# Patient Record
Sex: Male | Born: 1990 | Race: White | Hispanic: No | Marital: Single | Smoking: Former smoker
Health system: Southern US, Community
[De-identification: ages and names within clinical notes are randomized; demographics above are authoritative.]

## PROBLEM LIST (undated history)

## (undated) DIAGNOSIS — F4310 Post-traumatic stress disorder, unspecified: Secondary | ICD-10-CM

## (undated) DIAGNOSIS — F32A Depression, unspecified: Secondary | ICD-10-CM

## (undated) DIAGNOSIS — E785 Hyperlipidemia, unspecified: Secondary | ICD-10-CM

## (undated) DIAGNOSIS — F79 Unspecified intellectual disabilities: Secondary | ICD-10-CM

## (undated) DIAGNOSIS — E662 Morbid (severe) obesity with alveolar hypoventilation: Secondary | ICD-10-CM

## (undated) DIAGNOSIS — E119 Type 2 diabetes mellitus without complications: Secondary | ICD-10-CM

## (undated) DIAGNOSIS — E669 Obesity, unspecified: Secondary | ICD-10-CM

## (undated) DIAGNOSIS — F419 Anxiety disorder, unspecified: Secondary | ICD-10-CM

## (undated) DIAGNOSIS — F319 Bipolar disorder, unspecified: Secondary | ICD-10-CM

## (undated) DIAGNOSIS — F329 Major depressive disorder, single episode, unspecified: Secondary | ICD-10-CM

## (undated) DIAGNOSIS — F909 Attention-deficit hyperactivity disorder, unspecified type: Secondary | ICD-10-CM

## (undated) DIAGNOSIS — R0683 Snoring: Secondary | ICD-10-CM

## (undated) DIAGNOSIS — K219 Gastro-esophageal reflux disease without esophagitis: Secondary | ICD-10-CM

## (undated) DIAGNOSIS — I10 Essential (primary) hypertension: Secondary | ICD-10-CM

## (undated) DIAGNOSIS — F209 Schizophrenia, unspecified: Secondary | ICD-10-CM

## (undated) HISTORY — DX: Hyperlipidemia, unspecified: E78.5

## (undated) HISTORY — DX: Obesity, unspecified: E66.9

## (undated) HISTORY — DX: Morbid (severe) obesity with alveolar hypoventilation: E66.2

## (undated) HISTORY — DX: Essential (primary) hypertension: I10

## (undated) HISTORY — DX: Snoring: R06.83

## (undated) HISTORY — PX: HAND SURGERY: SHX662

## (undated) HISTORY — DX: Gastro-esophageal reflux disease without esophagitis: K21.9

## (undated) HISTORY — DX: Attention-deficit hyperactivity disorder, unspecified type: F90.9

## (undated) HISTORY — DX: Anxiety disorder, unspecified: F41.9

---
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
## 2009-03-08 ENCOUNTER — Inpatient Hospital Stay: Payer: Self-pay | Admitting: Psychiatry

## 2009-07-16 ENCOUNTER — Inpatient Hospital Stay: Payer: Self-pay | Admitting: Psychiatry

## 2009-09-01 ENCOUNTER — Emergency Department: Payer: Self-pay | Admitting: Unknown Physician Specialty

## 2009-11-30 ENCOUNTER — Inpatient Hospital Stay: Payer: Self-pay | Admitting: Psychiatry

## 2010-06-01 ENCOUNTER — Inpatient Hospital Stay: Payer: Self-pay | Admitting: Psychiatry

## 2010-06-13 ENCOUNTER — Emergency Department (HOSPITAL_COMMUNITY)
Admission: EM | Admit: 2010-06-13 | Discharge: 2010-06-13 | Payer: Self-pay | Source: Home / Self Care | Admitting: Emergency Medicine

## 2010-08-28 LAB — DIFFERENTIAL
Eosinophils Absolute: 0.2 10*3/uL (ref 0.0–0.7)
Lymphocytes Relative: 33 % (ref 12–46)
Lymphs Abs: 3 10*3/uL (ref 0.7–4.0)
Monocytes Relative: 11 % (ref 3–12)
Neutro Abs: 4.8 10*3/uL (ref 1.7–7.7)
Neutrophils Relative %: 53 % (ref 43–77)

## 2010-08-28 LAB — CBC
MCV: 84 fL (ref 78.0–100.0)
RBC: 4.76 MIL/uL (ref 4.22–5.81)
WBC: 9.1 10*3/uL (ref 4.0–10.5)

## 2010-08-28 LAB — RAPID URINE DRUG SCREEN, HOSP PERFORMED

## 2010-08-29 LAB — URINALYSIS, ROUTINE W REFLEX MICROSCOPIC
Nitrite: NEGATIVE
Specific Gravity, Urine: 1.024 (ref 1.005–1.030)
pH: 7 (ref 5.0–8.0)

## 2010-08-29 LAB — COMPREHENSIVE METABOLIC PANEL
ALT: 25 U/L (ref 0–53)
AST: 29 U/L (ref 0–37)
Albumin: 3.4 g/dL — ABNORMAL LOW (ref 3.5–5.2)
CO2: 23 mEq/L (ref 19–32)
Calcium: 8.6 mg/dL (ref 8.4–10.5)
Chloride: 105 mEq/L (ref 96–112)
GFR calc Af Amer: >60 mL/min (ref >60)
GFR calc non Af Amer: >60 mL/min (ref >60)
Sodium: 136 mEq/L (ref 135–145)

## 2010-08-29 LAB — VALPROIC ACID LEVEL: Valproic Acid Lvl: 73.5 ug/mL (ref 50.0–100.0)

## 2010-08-29 LAB — DIFFERENTIAL
Eosinophils Absolute: 0.3 10*3/uL (ref 0.0–0.7)
Eosinophils Relative: 5 % (ref 0–5)
Lymphs Abs: 2.5 10*3/uL (ref 0.7–4.0)
Monocytes Absolute: 0.7 10*3/uL (ref 0.1–1.0)

## 2010-08-29 LAB — CBC
MCHC: 34.5 g/dL (ref 30.0–36.0)
Platelets: 302 10*3/uL (ref 150–400)
RBC: 4.77 MIL/uL (ref 4.22–5.81)
WBC: 7 10*3/uL (ref 4.0–10.5)

## 2010-08-29 LAB — TRICYCLICS SCREEN, URINE: TCA Scrn: NOT DETECTED

## 2010-10-04 NOTE — Discharge Summary (Signed)
NAME:  Joshua Franco, Joshua Franco             ACCOUNT NO.:  [id number]   MEDICAL RECORD NO.:  [id number]          PATIENT TYPE:  IPS   LOCATION:  0406                          FACILITY:  [facility]   PHYSICIAN:  Anselm Jungling, MD  DATE OF BIRTH:  [birth year]   DATE OF ADMISSION:  11/25/2008  DATE OF DISCHARGE:  12/01/2008                               DISCHARGE SUMMARY   IDENTIFYING DATA AND REASON FOR ADMISSION:  This was an inpatient  psychiatric admission for Joshua Franco, a 20 year old, single, Caucasian male  from [place], who was admitted due to suicide risk.  He had been  cutting on himself.  He had been living in a group home for 3 weeks and  apparently did not like the group home.  He had previously been living  with his mother but had conflict with her there.  He had been in [facility] last month.  He had a  history of alcohol use, marijuana use, and legal problems.  Please refer  to the admission note for further details pertaining to the symptoms,  circumstances, and history that led to his hospitalization.  He was  given an initial Axis I diagnosis of mood disorder, NOS, and  polysubstance abuse, NOS.   MEDICAL AND LABORATORY:  The patient was medically and physically  assessed by the psychiatric nurse practitioner.  He was in good health  without any active or chronic medical problems.  There were no  significant medical issues.   HOSPITAL COURSE:  The patient was admitted to the adult inpatient  psychiatric service.  He presented as a large, moderately obese, young  man who was noted to be irritable but alert, cooperative, and fully  oriented.  He indicated that he was having suicidal thoughts but was  able to contract for safety.  He had no immediate plans to harm himself.  His level of insight was felt to be poor.  His impulse control was  guarded.  He was initially evaluated by Dr. Milford Cage.  The  undersigned assumed the patient's care  in the latter portion of the  patient's inpatient stay.  He had been continued on his usual regimen of  Invega, Depakote, and Vistaril.  He had been compliant with these.  He  had not been participating in therapeutic groups and activities,  preferring to stay in bed.   In contacting his mother and other past providers, we learned that the  patient has a history of acting out behaviors in response to adversity,  confrontation, or occasions in which he is asked to accept  responsibility for his actions.  In the past, he has shown a pattern of  uncooperative behavior.  The group home manager that had recently been  overseeing his residency there stated that When he doesn't like his  assigned chores or consequences, he tantrums or says he'll kill  himself.   It was our estimation that the patient's difficulties that led to  hospitalization were mostly the results of behavioral, volitional acting  out, and were not likely to be altered by further medication adjustment.  As such, he was continued on his same medication regimen.  He was  eventually returned to his previous group home on the 7th hospital day.   DISCHARGE AND AFTERCARE PLAN:  The patient was to follow up at [facility] with an appointment to see their psychiatrist on January 05, 2009, at 8:30 a.m.   DISCHARGE MEDICATIONS:  1. Depakote 1000 mg q.h.s.  2. Invega 6 mg q.h.s.  3. Vistaril 50 mg q.h.s.   DISCHARGE DIAGNOSES:  AXIS I:  Mood disorder, not otherwise specified.  AXIS II:  Antisocial personality disorder, not otherwise specified.  AXIS III:  No acute or chronic illnesses.  AXIS IV:  Stressors severe.  AXIS V:  Global Assessment of Functioning on discharge 50.      Anselm Jungling, MD  Electronically Signed     SPB/MEDQ  D:  12/04/2008  T:  12/04/2008  Job:  [id number]
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
## 2010-10-07 NOTE — Discharge Summary (Signed)
NAME:  Joshua Franco, VOGEL             ACCOUNT NO.:  [id number]   MEDICAL RECORD NO.:  [id number]          PATIENT TYPE:  IPS   LOCATION:  0405                          FACILITY:  [facility]   PHYSICIAN:  Anselm Jungling, MD  DATE OF BIRTH:  01/07/1991   DATE OF ADMISSION:  12/05/2008  DATE OF DISCHARGE:  12/10/2008                               DISCHARGE SUMMARY   IDENTIFYING DATA AND REASON FOR ADMISSION:  This is one of several [facility]  admissions for [name], a 20 year old single Caucasian male who came to  [place] from his group home.  He came to [place] with a history of schizoaffective  disorder.  He had been discharged from a facility within the week prior.  He was admitted due to episodic self-harm in response to increasing  subjective reports of auditory hallucinations.  Please refer to the  admission note for further details pertaining to the symptoms,  circumstances and history that led to his hospitalization.  He was given  an initial Axis I diagnosis of schizoaffective disorder NOS.   MEDICAL AND LABORATORY:  The patient was medically and physically  assessed by the psychiatric nurse practitioner.  Aside from obesity,  there were no significant medical issues, and no acute or chronic  illnesses.   HOSPITAL COURSE:  The patient was admitted to the adult inpatient  psychiatric service.  He presented as an obese, healthy-appearing male  who, as in previous admissions, tended to isolate to his room in bed for  most of the first 48-72 hours.  However, he was arousable, and appeared  to be nonpsychotic, nondelusional and fully oriented when awake.  He did  not object to his hospitalization and accepted it as a means of getting  help.  However, he was not able to state with any degree of insight or  accuracy the manner in which he might be helped once stabilized.   The patient was treated with a psychotropic regimen that included Invega  9 mg q.h.s., Depakote 1000 mg q.h.s. and Vistaril 50 mg  q.h.s.   On the fourth hospital day, the patient showed a markedly changed  presentation and behavior.  He was up, dressed, and active in the  morning goals group.  He was pleasant, readily engagable, and appeared  to be in good spirits.  From this point forth, it was possible to plan  his ultimate discharge and aftercare.  He was ultimately returned to his  previous group home who was willing to the time of 1 more chance there.   During the middle portion of his stay there were significant problems  with what appeared to be deliberate acting-out behaviors.  This seemed  to be in part due to irritability, but not necessarily due to a response  to any psychotic process or symptoms.  After an increase in his nightly  Invega, he did actually appear to be less irritable, more reasonable,  more rational, and more workable by appropriate psychosocial means and  interactions.   The patient was discharged on the sixth hospital day with the following  aftercare plan.   AFTERCARE:  The  patient was to follow-up with Dr. Jillyn Ledger on January 05, 2009 and 8:30 a.m.  He was also to follow-up at [facility].   DISCHARGE MEDICATIONS:  __________   1000 mg q.h.s., Vistaril 50 mg  q.h.s., and Invega 9 mg q.h.s.   DISCHARGE DIAGNOSES:  AXIS I: Schizoaffective disorder NOS.  AXIS II: Deferred.  AXIS III: Obesity.  AXIS IV: Stressors severe.  AXIS V: GAF on discharge 50.      Anselm Jungling, MD  Electronically Signed     SPB/MEDQ  D:  12/17/2008  T:  12/17/2008  Job:  [id number]
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
## 2010-11-03 ENCOUNTER — Emergency Department (INDEPENDENT_AMBULATORY_CARE_PROVIDER_SITE_OTHER): Payer: Medicare Other

## 2010-11-03 ENCOUNTER — Emergency Department (HOSPITAL_BASED_OUTPATIENT_CLINIC_OR_DEPARTMENT_OTHER)
Admission: EM | Admit: 2010-11-03 | Discharge: 2010-11-03 | Disposition: A | Discharge status: Home / Self Care | Payer: Medicare Other | Attending: Emergency Medicine | Admitting: Emergency Medicine

## 2010-11-03 DIAGNOSIS — R0789 Other chest pain: Secondary | ICD-10-CM | POA: Insufficient documentation

## 2010-11-03 DIAGNOSIS — I10 Essential (primary) hypertension: Secondary | ICD-10-CM | POA: Insufficient documentation

## 2010-11-03 DIAGNOSIS — R079 Chest pain, unspecified: Secondary | ICD-10-CM

## 2010-11-03 DIAGNOSIS — Z87891 Personal history of nicotine dependence: Secondary | ICD-10-CM

## 2010-11-03 DIAGNOSIS — E119 Type 2 diabetes mellitus without complications: Secondary | ICD-10-CM | POA: Insufficient documentation

## 2010-11-03 DIAGNOSIS — F319 Bipolar disorder, unspecified: Secondary | ICD-10-CM | POA: Insufficient documentation

## 2010-11-03 DIAGNOSIS — R071 Chest pain on breathing: Secondary | ICD-10-CM | POA: Insufficient documentation

## 2010-11-03 DIAGNOSIS — Z79899 Other long term (current) drug therapy: Secondary | ICD-10-CM | POA: Insufficient documentation

## 2010-11-03 DIAGNOSIS — R002 Palpitations: Secondary | ICD-10-CM

## 2010-11-03 LAB — BASIC METABOLIC PANEL
BUN: 13 mg/dL (ref 6–23)
CO2: 24 mEq/L (ref 19–32)
Calcium: 9.3 mg/dL (ref 8.4–10.5)
Chloride: 101 mEq/L (ref 96–112)
Creatinine, Ser: 0.8 mg/dL (ref 0.4–1.5)
Glucose, Bld: 102 mg/dL — ABNORMAL HIGH (ref 70–99)

## 2010-11-03 LAB — D-DIMER, QUANTITATIVE: D-Dimer, Quant: 0.23 ug/mL-FEU (ref 0.00–0.48)

## 2010-11-03 LAB — CBC
HCT: 38.6 % — ABNORMAL LOW (ref 39.0–52.0)
Hemoglobin: 13.3 g/dL (ref 13.0–17.0)
MCH: 29.9 pg (ref 26.0–34.0)
MCHC: 34.5 g/dL (ref 30.0–36.0)
MCV: 86.7 fL (ref 78.0–100.0)
RDW: 13.8 % (ref 11.5–15.5)

## 2010-11-03 LAB — DIFFERENTIAL
Basophils Relative: 0 % (ref 0–1)
Eosinophils Relative: 2 % (ref 0–5)
Lymphs Abs: 2.6 10*3/uL (ref 0.7–4.0)
Monocytes Absolute: 1.1 10*3/uL — ABNORMAL HIGH (ref 0.1–1.0)
Monocytes Relative: 12 % (ref 3–12)
Neutro Abs: 4.9 10*3/uL (ref 1.7–7.7)

## 2010-11-03 LAB — RAPID URINE DRUG SCREEN, HOSP PERFORMED
Barbiturates: NOT DETECTED
Cocaine: NOT DETECTED
Tetrahydrocannabinol: NOT DETECTED

## 2011-01-18 ENCOUNTER — Emergency Department (HOSPITAL_COMMUNITY)
Admission: EM | Admit: 2011-01-18 | Discharge: 2011-01-18 | Discharge status: Against Medical Advice | Payer: Medicare Other | Source: Home / Self Care | Attending: Emergency Medicine | Admitting: Emergency Medicine

## 2011-01-18 ENCOUNTER — Emergency Department (HOSPITAL_COMMUNITY)
Admission: EM | Admit: 2011-01-18 | Discharge: 2011-01-19 | Disposition: A | Discharge status: Other Acute Inpatient Hospital | Payer: Medicare Other | Source: Home / Self Care | Attending: Emergency Medicine | Admitting: Emergency Medicine

## 2011-01-18 DIAGNOSIS — F3130 Bipolar disorder, current episode depressed, mild or moderate severity, unspecified: Secondary | ICD-10-CM | POA: Insufficient documentation

## 2011-01-18 DIAGNOSIS — X789XXA Intentional self-harm by unspecified sharp object, initial encounter: Secondary | ICD-10-CM | POA: Insufficient documentation

## 2011-01-18 DIAGNOSIS — IMO0002 Reserved for concepts with insufficient information to code with codable children: Secondary | ICD-10-CM | POA: Insufficient documentation

## 2011-01-18 DIAGNOSIS — R45851 Suicidal ideations: Secondary | ICD-10-CM | POA: Insufficient documentation

## 2011-01-18 DIAGNOSIS — E119 Type 2 diabetes mellitus without complications: Secondary | ICD-10-CM | POA: Insufficient documentation

## 2011-01-18 DIAGNOSIS — I10 Essential (primary) hypertension: Secondary | ICD-10-CM | POA: Insufficient documentation

## 2011-01-18 LAB — CBC
MCH: 29.5 pg (ref 26.0–34.0)
MCHC: 33.3 g/dL (ref 30.0–36.0)
MCV: 88.7 fL (ref 78.0–100.0)
Platelets: 284 10*3/uL (ref 150–400)
RDW: 13.4 % (ref 11.5–15.5)

## 2011-01-18 LAB — COMPREHENSIVE METABOLIC PANEL
ALT: 55 U/L — ABNORMAL HIGH (ref 0–53)
AST: 28 U/L (ref 0–37)
CO2: 25 mEq/L (ref 19–32)
Calcium: 10 mg/dL (ref 8.4–10.5)
Chloride: 100 mEq/L (ref 96–112)
Creatinine, Ser: 0.83 mg/dL (ref 0.50–1.35)
GFR calc Af Amer: >60 mL/min (ref >60)
GFR calc non Af Amer: >60 mL/min (ref >60)
Glucose, Bld: 89 mg/dL (ref 70–99)
Sodium: 137 mEq/L (ref 135–145)
Total Bilirubin: 0.2 mg/dL — ABNORMAL LOW (ref 0.3–1.2)

## 2011-01-18 LAB — ACETAMINOPHEN LEVEL: Acetaminophen (Tylenol), Serum: <15 ug/mL (ref 10–30)

## 2011-01-18 LAB — DIFFERENTIAL
Basophils Relative: 0 % (ref 0–1)
Eosinophils Absolute: 0.2 10*3/uL (ref 0.0–0.7)
Eosinophils Relative: 1 % (ref 0–5)
Lymphs Abs: 4.1 10*3/uL — ABNORMAL HIGH (ref 0.7–4.0)
Monocytes Relative: 8 % (ref 3–12)

## 2011-01-18 LAB — RAPID URINE DRUG SCREEN, HOSP PERFORMED
Amphetamines: NOT DETECTED
Benzodiazepines: NOT DETECTED
Opiates: NOT DETECTED

## 2011-01-19 ENCOUNTER — Inpatient Hospital Stay (HOSPITAL_COMMUNITY)
Admission: AD | Admit: 2011-01-19 | Discharge: 2011-01-27 | DRG: 885 | Disposition: A | Discharge status: Home / Self Care | Payer: Medicare Other | Attending: Psychiatry | Admitting: Psychiatry

## 2011-01-19 DIAGNOSIS — Z560 Unemployment, unspecified: Secondary | ICD-10-CM

## 2011-01-19 DIAGNOSIS — F39 Unspecified mood [affective] disorder: Principal | ICD-10-CM

## 2011-01-19 DIAGNOSIS — I10 Essential (primary) hypertension: Secondary | ICD-10-CM

## 2011-01-19 DIAGNOSIS — E119 Type 2 diabetes mellitus without complications: Secondary | ICD-10-CM

## 2011-01-19 DIAGNOSIS — E7800 Pure hypercholesterolemia, unspecified: Secondary | ICD-10-CM

## 2011-01-19 DIAGNOSIS — E669 Obesity, unspecified: Secondary | ICD-10-CM

## 2011-01-19 DIAGNOSIS — R45851 Suicidal ideations: Secondary | ICD-10-CM

## 2011-01-19 DIAGNOSIS — Z79899 Other long term (current) drug therapy: Secondary | ICD-10-CM

## 2011-01-19 DIAGNOSIS — Z880 Allergy status to penicillin: Secondary | ICD-10-CM

## 2011-01-19 DIAGNOSIS — F3160 Bipolar disorder, current episode mixed, unspecified: Secondary | ICD-10-CM

## 2011-01-19 DIAGNOSIS — F70 Mild intellectual disabilities: Secondary | ICD-10-CM

## 2011-01-19 LAB — GLUCOSE, CAPILLARY

## 2011-01-20 DIAGNOSIS — F3112 Bipolar disorder, current episode manic without psychotic features, moderate: Secondary | ICD-10-CM

## 2011-01-21 DIAGNOSIS — F3112 Bipolar disorder, current episode manic without psychotic features, moderate: Secondary | ICD-10-CM

## 2011-01-21 LAB — GLUCOSE, CAPILLARY: Glucose-Capillary: 311 mg/dL — ABNORMAL HIGH (ref 70–99)

## 2011-01-22 LAB — URINE DRUGS OF ABUSE SCREEN W ALC, ROUTINE (REF LAB)
Amphetamine Screen, Ur: NEGATIVE
Cocaine Metabolites: NEGATIVE
Creatinine,U: 119.2 mg/dL
Opiate Screen, Urine: NEGATIVE

## 2011-01-22 LAB — GLUCOSE, CAPILLARY
Glucose-Capillary: 307 mg/dL — ABNORMAL HIGH (ref 70–99)
Glucose-Capillary: 270 mg/dL — ABNORMAL HIGH (ref 70–99)

## 2011-01-23 LAB — GLUCOSE, CAPILLARY: Glucose-Capillary: 373 mg/dL — ABNORMAL HIGH (ref 70–99)

## 2011-01-24 LAB — GLUCOSE, CAPILLARY
Glucose-Capillary: 311 mg/dL — ABNORMAL HIGH (ref 70–99)
Glucose-Capillary: 351 mg/dL — ABNORMAL HIGH (ref 70–99)
Glucose-Capillary: 277 mg/dL — ABNORMAL HIGH (ref 70–99)

## 2011-01-24 LAB — LIPID PANEL
Cholesterol: 190 mg/dL (ref 0–200)
LDL Cholesterol: UNDETERMINED mg/dL (ref 0–99)
Triglycerides: 579 mg/dL — ABNORMAL HIGH (ref <150)

## 2011-01-25 NOTE — Assessment & Plan Note (Signed)
NAME:  Joshua Franco, Joshua Franco             ACCOUNT NO.:  [id number]  MEDICAL RECORD NO.:  [id number]  LOCATION:  0402                          [facility]ILITY[facility]  PHYSICIAN:  Eulogio Ditch, MD DATE OF BIRTH:  31-Dec-1990  DATE OF ADMISSION:  01/19/2011 DATE OF DISCHARGE:                      PSYCHIATRIC ADMISSION ASSESSMENT   IDENTIFYING INFORMATION:  This is a 20 year old male that came on an involuntary petition on January 19, 2011.  REASON FOR ADMISSION:  The patient again presents on papers that state that the patient has been living in a group home.  He is diagnosed with bipolar disorder, current suicidal ideation with multiple plans to cut his wrist or throat or jumping in front of a car or overdosing on medications.  The patient reports that he is hearing voices and seeing things, seeing dead people.  He states that he has been using multiple substances listing heroin, Xanax and marijuana.  PAST PSYCHIATRIC HISTORY:  The patient has been in our facility for a couple other visits.  He listed in the chart that he is due to see a psychiatrist at [facility] on September 4.  SOCIAL HISTORY:  Again this is a 20 year old single male.  He states he has a 34-year-old daughter.  He is unemployed.  He is residing in a group home.  FAMILY HISTORY:  Unknown.  ALCOHOL AND DRUG HISTORY:  The patient reports using multiple substances, chart indicates that the patient has no access to street drugs.  PRIMARY CARE PROVIDER:  Dr. Freda Munro.  MEDICAL PROBLEMS:  The patient is obese, history of hypertension, high cholesterol and diabetes.  MEDICATIONS: 1. Listed as Depakote 500 mg one to two q.h.s. 2. Omeprazole 20 mg one daily. 3. Metformin 1000 mg one b.i.d. 4. Simvastatin 20 mg daily. 5. Multivitamin daily. 6. Celexa 20 mg daily. 7. Thorazine 10 mg one t.i.d.  DRUG ALLERGIES:  Amoxicillin, penicillin.  The patient referred to rashes.  PHYSICAL EXAM:  The patient was assessed at the  emergency department. This is an obese young male.  He appears in no distress.  Physical exam was reviewed.  Of note, the patient has linear abrasions on right forearm from wrist to elbow and more marks left neck.  They are all superficial lacerations with no bleeding.  He received a tetanus injection.  Alcohol level less than 11.  Salicylate level less than 2. CMP within normal limits.  Acetaminophen level less than 15.  White count is 12.5.  Urine drug screen is negative.  MENTAL STATUS EXAM:  He is fully alert and cooperative.  He is walking around with a hospital gown.  He is agreeable.  He immediately begins to tell me about his substance use and how he has been throwing himself around his room, hitting his head on the walls.  Staff indicates the patient has not been showing any behavior that he is talking about.  His thought processes, again he is endorsing auditory and visual hallucinations.  He does not to be appear to be actively responding. Judgment and insight are poor.  DIAGNOSES:  AXIS I:  Bipolar disorder not otherwise specified, per records. AXIS II:  Mild mental retardation. AXIS III:  History of diabetes, hypertension and high cholesterol. AXIS  IV:  Medical problems, psychosocial problems, chronic mental illness. AXIS V:  Current is 30.  PLAN:  To continue with the patient's home medications.  Will monitor levels.  Will check his blood sugars twice a day.  Will have case manager investigate the patient returning to group home and identify his other support.  His tentative length of stay at this time is 5-7 days.     Landry Corporal, N.P.   ______________________________ Eulogio Ditch, MD    JO/MEDQ  D:  01/20/2011  T:  01/20/2011  Job:  [id number]  Electronically Signed by Limmie PatriciaP. on 01/23/2011 09:22:53 AM Electronically Signed by Eulogio Ditch  on 01/25/2011 03:02:17 PM

## 2011-01-30 LAB — GLUCOSE, CAPILLARY
Glucose-Capillary: 187 mg/dL — ABNORMAL HIGH (ref 70–99)
Glucose-Capillary: 282 mg/dL — ABNORMAL HIGH (ref 70–99)
Glucose-Capillary: 327 mg/dL — ABNORMAL HIGH (ref 70–99)
Glucose-Capillary: 136 mg/dL — ABNORMAL HIGH (ref 70–99)
Glucose-Capillary: 110 mg/dL — ABNORMAL HIGH (ref 70–99)
Glucose-Capillary: 189 mg/dL — ABNORMAL HIGH (ref 70–99)

## 2011-01-30 NOTE — Discharge Summary (Signed)
  NAMEMarland Kitchen  Joshua Franco, Joshua Franco             ACCOUNT NO.:  [id number]  MEDICAL RECORD NO.:  [id number]  LOCATION:  0402                          FACILITY:  [facility]  PHYSICIAN:  Eulogio Ditch, MD DATE OF BIRTH:  [birth year]  DATE OF ADMISSION:  01/19/2011 DATE OF DISCHARGE:  01/27/2011                              DISCHARGE SUMMARY   HISTORY OF PRESENT ILLNESS:  Please see the initial psych assessment for details.  Briefly, a 20 year old male who was admitted under commitment papers.  The patient has a history of bipolar disorder and he verbalized suicidal ideation with plan to cut his wrist or jump in front of a car or overdose on the medication.  HOSPITAL COURSE:  During the hospital stay, the patient was started on his regular medications, Depakote and Celexa along with Thorazine. Initially, the patient verbalized suicidal thoughts on the unit and reported hearing voices.  The patient's Thorazine was discontinued and the patient was started on Risperdal which was slowly increased to 4 mg at bedtime.  The patient responded to the medication well without any side effects.  The patient was also seen by the treatment team a number of times and psychoeducation given to the patient as he was saying suicidal ideation just to get attention.  The patient agreed to go back to the group home.  Initially, he did not want to go back to the group home and [facility].  The patient's grandmother was also involved in the treatment.  Grandmother was agreeable that the patient should go back to the group home.  Initially, the plan was that the patient would live with the grandmother, but later on as he responded to the Risperdal and his insight improved, he was agreeable to go to the group home.  MENTAL STATUS EXAMINATION:  At the time of discharge on January 26, 2011, the patient is logical and goal-directed, not hallucinating or delusional, not suicidal or homicidal.  No side  effects reported from the medication.  His insight and judgment improved.  No abnormal behavior or agitation reported by the staff.  The patient denied any side effects from the medications.  DIAGNOSES AT THE TIME OF DISCHARGE:  Axis I:  Mood disorder NOS.   Rule out bipolar disorder mixed type. AXIS II: Mild mental retardation. AXIS III: Diabetes.   Hypertension,.   High cholesterol. AXIS IV: Chronic mental health issues. AXIS V:  55.  DISCHARGE MEDICATIONS: 1. Celexa 20 mg p.o. daily. 2. Depakote 1000 mg at bedtime 500 in the morning. 3. Risperdal 4 mg at bedtime. 4. Metformin 1000 mg p.o. twice a day. 5. Protonix 40 mg p.o. daily. 6. Zocor 20 mg p.o. daily.  DISCHARGE FOLLOWUP:  the patient will follow up at [facility], appointment 09/11.  The patient will also follow with [facility],, phone number [phone], appointment 09/07 and also at [facility] day program 05/30/2008.     Eulogio Ditch, MD     SA/MEDQ  D:  01/27/2011  T:  01/27/2011  Job:  [id number]  Electronically Signed by Eulogio Ditch  on 01/30/2011 01:04:15 PM

## 2011-02-10 ENCOUNTER — Emergency Department (HOSPITAL_COMMUNITY)
Admission: EM | Admit: 2011-02-10 | Discharge: 2011-02-10 | Disposition: A | Discharge status: Other Acute Inpatient Hospital | Payer: Medicare Other | Source: Home / Self Care | Attending: Emergency Medicine | Admitting: Emergency Medicine

## 2011-02-10 ENCOUNTER — Inpatient Hospital Stay (HOSPITAL_COMMUNITY)
Admission: AD | Admit: 2011-02-10 | Discharge: 2011-02-15 | DRG: 885 | Disposition: A | Discharge status: Home / Self Care | Payer: Medicare Other | Attending: Psychiatry | Admitting: Psychiatry

## 2011-02-10 DIAGNOSIS — E7800 Pure hypercholesterolemia, unspecified: Secondary | ICD-10-CM

## 2011-02-10 DIAGNOSIS — E119 Type 2 diabetes mellitus without complications: Secondary | ICD-10-CM

## 2011-02-10 DIAGNOSIS — F70 Mild intellectual disabilities: Secondary | ICD-10-CM

## 2011-02-10 DIAGNOSIS — Z139 Encounter for screening, unspecified: Secondary | ICD-10-CM | POA: Insufficient documentation

## 2011-02-10 DIAGNOSIS — R45851 Suicidal ideations: Secondary | ICD-10-CM | POA: Insufficient documentation

## 2011-02-10 DIAGNOSIS — E785 Hyperlipidemia, unspecified: Secondary | ICD-10-CM

## 2011-02-10 DIAGNOSIS — F3164 Bipolar disorder, current episode mixed, severe, with psychotic features: Principal | ICD-10-CM

## 2011-02-10 DIAGNOSIS — Z880 Allergy status to penicillin: Secondary | ICD-10-CM

## 2011-02-10 DIAGNOSIS — I10 Essential (primary) hypertension: Secondary | ICD-10-CM | POA: Insufficient documentation

## 2011-02-10 DIAGNOSIS — E781 Pure hyperglyceridemia: Secondary | ICD-10-CM

## 2011-02-10 DIAGNOSIS — F988 Other specified behavioral and emotional disorders with onset usually occurring in childhood and adolescence: Secondary | ICD-10-CM | POA: Insufficient documentation

## 2011-02-10 DIAGNOSIS — F79 Unspecified intellectual disabilities: Secondary | ICD-10-CM | POA: Insufficient documentation

## 2011-02-10 DIAGNOSIS — F319 Bipolar disorder, unspecified: Secondary | ICD-10-CM | POA: Insufficient documentation

## 2011-02-10 DIAGNOSIS — E669 Obesity, unspecified: Secondary | ICD-10-CM

## 2011-02-10 DIAGNOSIS — R1013 Epigastric pain: Secondary | ICD-10-CM

## 2011-02-10 DIAGNOSIS — Z79899 Other long term (current) drug therapy: Secondary | ICD-10-CM

## 2011-02-10 LAB — DIFFERENTIAL
Basophils Absolute: 0 10*3/uL (ref 0.0–0.1)
Basophils Relative: 0 % (ref 0–1)
Eosinophils Absolute: 0.2 10*3/uL (ref 0.0–0.7)
Monocytes Relative: 8 % (ref 3–12)
Neutro Abs: 5.4 10*3/uL (ref 1.7–7.7)
Neutrophils Relative %: 55 % (ref 43–77)

## 2011-02-10 LAB — COMPREHENSIVE METABOLIC PANEL
Alkaline Phosphatase: 83 U/L (ref 39–117)
BUN: 13 mg/dL (ref 6–23)
Creatinine, Ser: 0.8 mg/dL (ref 0.50–1.35)
GFR calc Af Amer: >60 mL/min (ref >60)
Glucose, Bld: 103 mg/dL — ABNORMAL HIGH (ref 70–99)
Potassium: 4.3 mEq/L (ref 3.5–5.1)
Total Bilirubin: 0.2 mg/dL — ABNORMAL LOW (ref 0.3–1.2)
Total Protein: 6.9 g/dL (ref 6.0–8.3)

## 2011-02-10 LAB — RAPID URINE DRUG SCREEN, HOSP PERFORMED
Amphetamines: NOT DETECTED
Cocaine: NOT DETECTED
Opiates: NOT DETECTED

## 2011-02-10 LAB — CBC
MCH: 29 pg (ref 26.0–34.0)
Platelets: 308 10*3/uL (ref 150–400)
RBC: 4.69 MIL/uL (ref 4.22–5.81)
WBC: 9.9 10*3/uL (ref 4.0–10.5)

## 2011-02-10 LAB — ETHANOL: Alcohol, Ethyl (B): <11 mg/dL (ref 0–11)

## 2011-02-11 DIAGNOSIS — F3164 Bipolar disorder, current episode mixed, severe, with psychotic features: Secondary | ICD-10-CM

## 2011-02-11 LAB — GLUCOSE, CAPILLARY: Glucose-Capillary: 146 mg/dL — ABNORMAL HIGH (ref 70–99)

## 2011-02-12 LAB — GLUCOSE, CAPILLARY
Glucose-Capillary: 146 mg/dL — ABNORMAL HIGH (ref 70–99)
Glucose-Capillary: 267 mg/dL — ABNORMAL HIGH (ref 70–99)

## 2011-02-13 LAB — T4, FREE: Free T4: 1 ng/dL (ref 0.80–1.80)

## 2011-02-13 LAB — RPR: RPR Ser Ql: NONREACTIVE

## 2011-02-14 LAB — GLUCOSE, CAPILLARY
Glucose-Capillary: 264 mg/dL — ABNORMAL HIGH (ref 70–99)
Glucose-Capillary: 190 mg/dL — ABNORMAL HIGH (ref 70–99)

## 2011-02-14 NOTE — H&P (Deleted)
  NAME:  Joshua Franco, Joshua Franco             ACCOUNT NO.:  [id number]  MEDICAL RECORD NO.:  [id number]  LOCATION:  0405                          FACILITY:  [facility]  PHYSICIAN:  Ciearra Rufo, MD        DATE OF BIRTH:  07/07/1990  DATE OF ADMISSION:  02/10/2011 DATE OF DISCHARGE:                             HISTORY & PHYSICAL   PRIMARY CARE PHYSICIAN:  Dr. Ziolkowska.  CHIEF COMPLAINT:  Suicidal ideation; consult for hypertriglyceridemia.  HISTORY OF PRESENT ILLNESS:  A 20-year-old male with history of diabetes, dyslipidemia, obesity, [facility]ation.  The patient had a elevated triglyceride level, for which Internal Medicine from consult was sought.  The patient reports being in generally healthy state.  At present the patient complains of epigastric and has a periumbilical pain, nonradiating of 1 day duration. The patient rates the pain 5/10 in intensity, not alleviated by any factors, no aggravating factors.  No reports of nausea or vomiting, no reports of fever, chills, no blood in stool or urine.  No chest pain, no cough.  ALLERGIES:  PENICILLIN, AMOXICILLIN.  REVIEW OF SYSTEMS:  As per HPI.  PAST MEDICAL HISTORY: 1. Diabetes. 2. Dyslipidemia. 3. Obesity. 4. Mild mental retardation. 5. Psychosis.  PAST SURGICAL HISTORY:  Surgery for injured tendon on the right wrist.  MEDICATIONS: 1. Depakote 500 mg 1-2 tablets at bedtime. 2. Metformin 1000 mg twice a day. 3. Simvastatin 20 mg at bedtime. 4. Multivitamin. 5. Celexa 20 mg. 6. Thorazine 10 mg three times.  PHYSICAL EXAMINATION:  VITAL SIGNS:  Blood pressure 116/80, pulse 90, respirations 16. GENERAL APPEARANCE:  No acute distress, well appearing. LUNGS:  Clear to auscultation bilaterally, no wheezing, no rhonchi, no rales. CARDIOVASCULAR:  Positive S1 and S2, regular rate and rhythm, no murmurs. ABDOMEN:  Positive bowel sounds, soft, obese, nontender/nondistended. EXTREMITIES:  Pulses  palpable bilaterally, no lower extremity edema. NEUROLOGICAL:  Alert, awake, and oriented x3; no focal neurologic deficits. SKIN:  Warm, dry. NECK:  Supple, no lymphadenopathy.  LABORATORY VALUES:  Total cholesterol 100, triglycerides 579, HDL 38.  ASSESSMENT/PLAN: 1. Hypertriglyceridemia likely diet related, less likely familial.     Plan is to start TriCor 145 mg daily as well as some fish oil     supplement 1 gram daily.  The patient will have to follow up lipid     profile panel in about 4 to 8 weeks. 2. Diabetes.  Blood glucose fingersticks 146, 143, and 146.  Well     managed with metformin 1000 mg twice a day. 3. Psychosis.  Management as per Psychiatry.  More than 35 minutes has been spent consulting this patient.          ______________________________ Ani Deoliveira, MD     AD/MEDQ  D:  02/12/2011  T:  02/13/2011  Job:  [id number]  Electronically Signed by Caniya Tagle MD on 02/14/2011 05:25:49 PM 

## 2011-02-15 LAB — GLUCOSE, CAPILLARY: Glucose-Capillary: 130 mg/dL — ABNORMAL HIGH (ref 70–99)

## 2011-02-23 ENCOUNTER — Emergency Department (HOSPITAL_COMMUNITY)
Admission: EM | Admit: 2011-02-23 | Discharge: 2011-02-23 | Disposition: A | Discharge status: Home / Self Care | Payer: Medicare Other | Attending: Emergency Medicine | Admitting: Emergency Medicine

## 2011-02-23 DIAGNOSIS — I10 Essential (primary) hypertension: Secondary | ICD-10-CM | POA: Insufficient documentation

## 2011-02-23 DIAGNOSIS — E119 Type 2 diabetes mellitus without complications: Secondary | ICD-10-CM | POA: Insufficient documentation

## 2011-02-23 DIAGNOSIS — R443 Hallucinations, unspecified: Secondary | ICD-10-CM | POA: Insufficient documentation

## 2011-02-23 DIAGNOSIS — R45851 Suicidal ideations: Secondary | ICD-10-CM | POA: Insufficient documentation

## 2011-02-23 DIAGNOSIS — Z046 Encounter for general psychiatric examination, requested by authority: Secondary | ICD-10-CM | POA: Insufficient documentation

## 2011-02-23 DIAGNOSIS — F79 Unspecified intellectual disabilities: Secondary | ICD-10-CM | POA: Insufficient documentation

## 2011-02-23 LAB — COMPREHENSIVE METABOLIC PANEL
ALT: 18 U/L (ref 0–53)
AST: 19 U/L (ref 0–37)
Albumin: 3.2 g/dL — ABNORMAL LOW (ref 3.5–5.2)
Calcium: 9.4 mg/dL (ref 8.4–10.5)
Creatinine, Ser: 0.91 mg/dL (ref 0.50–1.35)
Sodium: 136 mEq/L (ref 135–145)
Total Protein: 6.6 g/dL (ref 6.0–8.3)

## 2011-02-23 LAB — CBC
HCT: 41.5 % (ref 39.0–52.0)
Hemoglobin: 13.6 g/dL (ref 13.0–17.0)
MCHC: 32.8 g/dL (ref 30.0–36.0)
MCV: 88.3 fL (ref 78.0–100.0)
WBC: 10.7 10*3/uL — ABNORMAL HIGH (ref 4.0–10.5)

## 2011-02-23 LAB — DIFFERENTIAL
Basophils Absolute: 0 10*3/uL (ref 0.0–0.1)
Eosinophils Relative: 2 % (ref 0–5)
Lymphocytes Relative: 40 % (ref 12–46)
Lymphs Abs: 4.3 10*3/uL — ABNORMAL HIGH (ref 0.7–4.0)
Monocytes Absolute: 1.1 10*3/uL — ABNORMAL HIGH (ref 0.1–1.0)
Neutro Abs: 5.1 10*3/uL (ref 1.7–7.7)

## 2011-02-23 LAB — RAPID URINE DRUG SCREEN, HOSP PERFORMED
Barbiturates: NOT DETECTED
Benzodiazepines: NOT DETECTED
Cocaine: NOT DETECTED
Tetrahydrocannabinol: NOT DETECTED

## 2011-02-24 NOTE — Consult Note (Deleted)
  NAME:  Joshua Franco, Joshua Franco             ACCOUNT NO.:  [id number]  MEDICAL RECORD NO.:  [id number]  LOCATION:  0405                          FACILITY:  [facility]  PHYSICIAN:  Manson Passey, MD        DATE OF BIRTH:  December 13, 1990  DATE OF ADMISSION:  02/10/2011 DATE OF DISCHARGE:                             HISTORY & PHYSICAL   PRIMARY CARE PHYSICIAN:  Dr. Sharmon Revere.  CHIEF COMPLAINT:  Suicidal ideation; consult for hypertriglyceridemia.  HISTORY OF PRESENT ILLNESS:  A 20 year old male with history of diabetes, dyslipidemia, obesity, hospitalized in [facility] for suicidal ideation.  The patient had a elevated triglyceride level, for which Internal Medicine from consult was sought.  The patient reports being in generally healthy state.  At present the patient complains of epigastric and has a periumbilical pain, nonradiating of 1 day duration. The patient rates the pain 5/10 in intensity, not alleviated by any factors, no aggravating factors.  No reports of nausea or vomiting, no reports of fever, chills, no blood in stool or urine.  No chest pain, no cough.  ALLERGIES:  PENICILLIN, AMOXICILLIN.  REVIEW OF SYSTEMS:  As per HPI.  PAST MEDICAL HISTORY: 1. Diabetes. 2. Dyslipidemia. 3. Obesity. 4. Mild mental retardation. 5. Psychosis.  PAST SURGICAL HISTORY:  Surgery for injured tendon on the right wrist.  MEDICATIONS: 1. Depakote 500 mg 1-2 tablets at bedtime. 2. Metformin 1000 mg twice a day. 3. Simvastatin 20 mg at bedtime. 4. Multivitamin. 5. Celexa 20 mg. 6. Thorazine 10 mg three times.  PHYSICAL EXAMINATION:  VITAL SIGNS:  Blood pressure 116/80, pulse 90, respirations 16. GENERAL APPEARANCE:  No acute distress, well appearing. LUNGS:  Clear to auscultation bilaterally, no wheezing, no rhonchi, no rales. CARDIOVASCULAR:  Positive S1 and S2, regular rate and rhythm, no murmurs. ABDOMEN:  Positive bowel sounds, soft, obese, nontender/nondistended. EXTREMITIES:  Pulses  palpable bilaterally, no lower extremity edema. NEUROLOGICAL:  Alert, awake, and oriented x3; no focal neurologic deficits. SKIN:  Warm, dry. NECK:  Supple, no lymphadenopathy.  LABORATORY VALUES:  Total cholesterol 100, triglycerides 579, HDL 38.  ASSESSMENT/PLAN: 1. Hypertriglyceridemia likely diet related, less likely familial.     Plan is to start TriCor 145 mg daily as well as some fish oil     supplement 1 gram daily.  The patient will have to follow up lipid     profile panel in about 4 to 8 weeks. 2. Diabetes.  Blood glucose fingersticks 146, 143, and 146.  Well     managed with metformin 1000 mg twice a day. 3. Psychosis.  Management as per Psychiatry.  More than 35 minutes has been spent consulting this patient.          ______________________________ Manson Passey, MD     AD/MEDQ  D:  02/12/2011  T:  02/13/2011  Job:  [id number]  Electronically Signed by Manson Passey MD on 02/14/2011 05:25:49 PM

## 2011-03-02 NOTE — H&P (Deleted)
  NAME:  Joshua Franco, [name]             ACCOUNT NO.:  [id number]  MEDICAL RECORD NO.:  [id number]  LOCATION:                                 FACILITY:  PHYSICIAN:  Manson Passey, MD        DATE OF BIRTH:  Feb 21, 1991  DATE OF ADMISSION:  02/10/2011 DATE OF DISCHARGE:                             HISTORY & PHYSICAL   PRIMARY CARE PHYSICIAN:  Dr. Sharmon Revere.  CHIEF COMPLAINT:  Suicidal ideation; consult for hypertriglyceridemia.  HISTORY OF PRESENT ILLNESS:  A 20 year old male with history of diabetes, dyslipidemia, obesity, hospitalized in [facility] for suicidal ideation.  The patient had a elevated triglyceride level, for which Internal Medicine from consult was sought.  The patient reports being in generally healthy state.  At present the patient complains of epigastric and has a periumbilical pain, nonradiating of 1 day duration. The patient rates the pain 5/10 in intensity, not alleviated by any factors, no aggravating factors.  No reports of nausea or vomiting, no reports of fever, chills, no blood in stool or urine.  No chest pain, no cough.  ALLERGIES:  PENICILLIN, AMOXICILLIN.  REVIEW OF SYSTEMS:  As per HPI.  PAST MEDICAL HISTORY: 1. Diabetes. 2. Dyslipidemia. 3. Obesity. 4. Mild mental retardation. 5. Psychosis.  PAST SURGICAL HISTORY:  Surgery for injured tendon on the right wrist.  MEDICATIONS: 1. Depakote 500 mg 1-2 tablets at bedtime. 2. Metformin 1000 mg twice a day. 3. Simvastatin 20 mg at bedtime. 4. Multivitamin. 5. Celexa 20 mg. 6. Thorazine 10 mg three times.  PHYSICAL EXAMINATION:  VITAL SIGNS:  Blood pressure 116/80, pulse 90, respirations 16. GENERAL APPEARANCE:  No acute distress, well appearing. LUNGS:  Clear to auscultation bilaterally, no wheezing, no rhonchi, no rales. CARDIOVASCULAR:  Positive S1 and S2, regular rate and rhythm, no murmurs. ABDOMEN:  Positive bowel sounds, soft, obese, nontender/nondistended. EXTREMITIES:  Pulses  palpable bilaterally, no lower extremity edema. NEUROLOGICAL:  Alert, awake, and oriented x3; no focal neurologic deficits. SKIN:  Warm, dry. NECK:  Supple, no lymphadenopathy.  LABORATORY VALUES:  Total cholesterol 100, triglycerides 579, HDL 38.  ASSESSMENT/PLAN: 1. Hypertriglyceridemia likely diet related, less likely familial.     Plan is to start TriCor 145 mg daily as well as some fish oil     supplement 1 gram daily.  The patient will have to follow up lipid     profile panel in about 4 to 8 weeks. 2. Diabetes.  Blood glucose fingersticks 146, 143, and 146.  Well     managed with metformin 1000 mg twice a day. 3. Psychosis.  Management as per Psychiatry.  More than 35 minutes has been spent consulting this patient.          ______________________________ Manson Passey, MD     AD/MEDQ  D:  02/12/2011  T:  02/13/2011  Job:  [id number]  Electronically Signed by Manson Passey MD on 03/02/2011 09:40:50 AM

## 2011-03-02 NOTE — H&P (Signed)
  NAME:  Joshua Franco, Joshua Franco             ACCOUNT NO.:  [id number]  MEDICAL RECORD NO.:  [id number]  LOCATION:                                 FACILITY:  PHYSICIAN:  Manson Passey, MD        DATE OF BIRTH:  [birth year]  DATE OF ADMISSION:  02/10/2011 DATE OF DISCHARGE:                             HISTORY & PHYSICAL   PRIMARY CARE PHYSICIAN:  Dr. Sharmon Revere.  CHIEF COMPLAINT:  Suicidal ideation; consult for hypertriglyceridemia.  HISTORY OF PRESENT ILLNESS:  A 20 year old male with history of diabetes, dyslipidemia, obesity, hospitalized in [facility] for suicidal ideation.  The patient had a elevated triglyceride level, for which Internal Medicine from consult was sought.  The patient reports being in generally healthy state.  At present the patient complains of epigastric and has a periumbilical pain, nonradiating of 1 day duration. The patient rates the pain 5/10 in intensity, not alleviated by any factors, no aggravating factors.  No reports of nausea or vomiting, no reports of fever, chills, no blood in stool or urine.  No chest pain, no cough.  ALLERGIES:  PENICILLIN, AMOXICILLIN.  REVIEW OF SYSTEMS:  As per HPI.  PAST MEDICAL HISTORY: 1. Diabetes. 2. Dyslipidemia. 3. Obesity. 4. Mild mental retardation. 5. Psychosis.  PAST SURGICAL HISTORY:  Surgery for injured tendon on the right wrist.  MEDICATIONS: 1. Depakote 500 mg 1-2 tablets at bedtime. 2. Metformin 1000 mg twice a day. 3. Simvastatin 20 mg at bedtime. 4. Multivitamin. 5. Celexa 20 mg. 6. Thorazine 10 mg three times.  PHYSICAL EXAMINATION:  VITAL SIGNS:  Blood pressure 116/80, pulse 90, respirations 16. GENERAL APPEARANCE:  No acute distress, well appearing. LUNGS:  Clear to auscultation bilaterally, no wheezing, no rhonchi, no rales. CARDIOVASCULAR:  Positive S1 and S2, regular rate and rhythm, no murmurs. ABDOMEN:  Positive bowel sounds, soft, obese, nontender/nondistended. EXTREMITIES:  Pulses  palpable bilaterally, no lower extremity edema. NEUROLOGICAL:  Alert, awake, and oriented x3; no focal neurologic deficits. SKIN:  Warm, dry. NECK:  Supple, no lymphadenopathy.  LABORATORY VALUES:  Total cholesterol 100, triglycerides 579, HDL 38.  ASSESSMENT/PLAN: 1. Hypertriglyceridemia likely diet related, less likely familial.     Plan is to start TriCor 145 mg daily as well as some fish oil     supplement 1 gram daily.  The patient will have to follow up lipid     profile panel in about 4 to 8 weeks. 2. Diabetes.  Blood glucose fingersticks 146, 143, and 146.  Well     managed with metformin 1000 mg twice a day. 3. Psychosis.  Management as per Psychiatry.  More than 35 minutes has been spent consulting this patient.          ______________________________ Manson Passey, MD     AD/MEDQ  D:  02/12/2011  T:  02/13/2011  Job:  [id number]  Electronically Signed by Manson Passey MD on 03/02/2011 09:40:47 AM

## 2011-03-05 ENCOUNTER — Emergency Department (HOSPITAL_COMMUNITY): Payer: Medicare Other

## 2011-03-05 ENCOUNTER — Emergency Department (HOSPITAL_COMMUNITY)
Admission: EM | Admit: 2011-03-05 | Discharge: 2011-03-06 | Disposition: A | Discharge status: Home / Self Care | Payer: Medicare Other | Attending: Emergency Medicine | Admitting: Emergency Medicine

## 2011-03-05 DIAGNOSIS — E119 Type 2 diabetes mellitus without complications: Secondary | ICD-10-CM | POA: Insufficient documentation

## 2011-03-05 DIAGNOSIS — F17200 Nicotine dependence, unspecified, uncomplicated: Secondary | ICD-10-CM | POA: Insufficient documentation

## 2011-03-05 DIAGNOSIS — R05 Cough: Secondary | ICD-10-CM | POA: Insufficient documentation

## 2011-03-05 DIAGNOSIS — R0602 Shortness of breath: Secondary | ICD-10-CM | POA: Insufficient documentation

## 2011-03-05 DIAGNOSIS — R079 Chest pain, unspecified: Secondary | ICD-10-CM | POA: Insufficient documentation

## 2011-03-05 DIAGNOSIS — F79 Unspecified intellectual disabilities: Secondary | ICD-10-CM | POA: Insufficient documentation

## 2011-03-05 DIAGNOSIS — R059 Cough, unspecified: Secondary | ICD-10-CM | POA: Insufficient documentation

## 2011-03-05 DIAGNOSIS — R0789 Other chest pain: Secondary | ICD-10-CM | POA: Insufficient documentation

## 2011-03-05 DIAGNOSIS — I10 Essential (primary) hypertension: Secondary | ICD-10-CM | POA: Insufficient documentation

## 2011-03-05 LAB — POCT I-STAT, CHEM 8
BUN: 16 mg/dL (ref 6–23)
Calcium, Ion: 1.16 mmol/L (ref 1.12–1.32)
Chloride: 104 mEq/L (ref 96–112)
Creatinine, Ser: 1 mg/dL (ref 0.50–1.35)
Glucose, Bld: 103 mg/dL — ABNORMAL HIGH (ref 70–99)
HCT: 44 % (ref 39.0–52.0)
Hemoglobin: 15 g/dL (ref 13.0–17.0)
Potassium: 4 mEq/L (ref 3.5–5.1)
Sodium: 138 mEq/L (ref 135–145)
TCO2: 24 mmol/L (ref 0–100)

## 2011-03-05 LAB — POCT I-STAT TROPONIN I: Troponin i, poc: 0.01 ng/mL (ref 0.00–0.08)

## 2011-03-05 LAB — D-DIMER, QUANTITATIVE: D-Dimer, Quant: <0.22 ug/mL-FEU (ref 0.00–0.48)

## 2011-03-06 LAB — GLUCOSE, CAPILLARY: Glucose-Capillary: 97 mg/dL (ref 70–99)

## 2011-03-07 NOTE — Discharge Summary (Signed)
NAME:  Joshua Franco, Joshua Franco             ACCOUNT NO.:  [id number]  MEDICAL RECORD NO.:  [id number]  LOCATION:  0405                          FACILITY:  [facility]  PHYSICIAN:  Eulogio Ditch, MD DATE OF BIRTH:  05/26/1990  DATE OF ADMISSION:  02/10/2011 DATE OF DISCHARGE:  02/15/2011                              DISCHARGE SUMMARY   IDENTIFYING INFORMATION:  This is a 20 year old single Caucasian male. This was an involuntary admission.  HISTORY OF PRESENT ILLNESS/I:  Joshua Franco lives in a group home and presents after making some superficial cuts to his forearm and was threatening to drink cologne or jump out of a car.  MEDICAL EVALUATION:  He was medically evaluated by Dr. Manson Passey, MD of our medical staff.  CHRONIC MEDICAL PROBLEMS: 1. Diabetes. 2. Dyslipidemia. 3. Obesity. 4. Mild mental retardation and psychosis. 5. Found to have significantly elevated triglycerides and was started     on TriCor 145 mg daily as well as some fish oil supplementation.  PHYSICAL EXAM:  Unremarkable.  COURSE OF HOSPITALIZATION:  He was admitted to our acute stabilization unit.  Where we continued his routine medications including Depakote ER 1000 mg at bedtime, his medications for his hypertriglyceridemia and Zocor 20 mg daily for elevated cholesterol.  Also continued his routine diabetes medications.  He was placed on a modified diabetes diet and received consultations from our diabetes nurse while here on the unit. We also started him on his fish oil and TriCor as recommended by Internal Medicine.  Diagnostic studies performed while on the unit.  Include routine CBGs witched ranged from 120-283 mg/dL.  TSH was found to be normal at 3.169. Valproic acid level 64.9 on September 24 and RPR was found to be negative.  He was placed on one-to-one observation and visitors restricted due to violating smoking rules while on the unit. Joshua Franco had been in a group home since February and had been followed  by Dr. Ladona Ridgel at [facility] for ongoing mental illness. Reported that he had problems with auditory hallucinations, worse in the late evenings and sometimes they interrupted his sleep around 2:00 am. Risperdal was increased to 1 mg every 6:00 p.m.  to address the evening hallucinations along with 4 mg which was continued at bedtime.  By the 26th he was having no acute hallucinations, much more stable, compliant with medications and felt ready for discharge.  Suicide risk was gauged to be minimal.  DISCHARGE PLAN:  Follow up with [facility] on Friday September 28th at 2:00 p.m. and [facility] for medications on Friday the 28th at 9:00 am.  He was given instructions to follow up with his primary medical doctor in 4 weeks for re-evaluation of elevated triglycerides of 579.  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 Axis II:  Borderline personality disorder.  Mild mental retardation. Axis III:  Diabetes mellitus type 2, hypertriglyceridemia and hypercholesterolemia. Axis IV:  Deferred. Axis V:  Current 50, past year 55 estimated.  He was discharged back to his group home.  DISCHARGE MEDICATIONS: 1. Depakote ER 1000 mg at bedtime. 2. Fenofibrate 160 mg daily. 3. Omega-3 fatty acids 1g daily. 4. Risperdal  1 mg at 1800 hours daily and 4 mg at bedtime. 5. Citalopram 20 mg daily. 6. Metformin 1000 mg b.i.d. with meals. 7. Protonix 40 mg daily. 8. Simvastatin 20 mg daily.     Margaret A. Lorin Picket, N.P.   ______________________________ Eulogio Ditch, MD    MAS/MEDQ  D:  03/02/2011  T:  03/03/2011  Job:  [id number]  Electronically Signed by Kari Baars N.P. on 03/03/2011 10:09:17 AM Electronically Signed by Eulogio Ditch  on 03/07/2011 09:55:40 AM

## 2011-03-09 ENCOUNTER — Emergency Department (HOSPITAL_COMMUNITY): Payer: Medicare Other

## 2011-03-09 ENCOUNTER — Emergency Department (HOSPITAL_COMMUNITY)
Admission: EM | Admit: 2011-03-09 | Discharge: 2011-03-09 | Disposition: A | Discharge status: Home / Self Care | Payer: Medicare Other | Attending: Emergency Medicine | Admitting: Emergency Medicine

## 2011-03-09 DIAGNOSIS — E119 Type 2 diabetes mellitus without complications: Secondary | ICD-10-CM | POA: Insufficient documentation

## 2011-03-09 DIAGNOSIS — F988 Other specified behavioral and emotional disorders with onset usually occurring in childhood and adolescence: Secondary | ICD-10-CM | POA: Insufficient documentation

## 2011-03-09 DIAGNOSIS — F79 Unspecified intellectual disabilities: Secondary | ICD-10-CM | POA: Insufficient documentation

## 2011-03-09 DIAGNOSIS — R071 Chest pain on breathing: Secondary | ICD-10-CM | POA: Insufficient documentation

## 2011-03-09 DIAGNOSIS — F319 Bipolar disorder, unspecified: Secondary | ICD-10-CM | POA: Insufficient documentation

## 2011-03-09 DIAGNOSIS — E785 Hyperlipidemia, unspecified: Secondary | ICD-10-CM | POA: Insufficient documentation

## 2011-03-09 DIAGNOSIS — Z79899 Other long term (current) drug therapy: Secondary | ICD-10-CM | POA: Insufficient documentation

## 2011-03-09 DIAGNOSIS — R45851 Suicidal ideations: Secondary | ICD-10-CM | POA: Insufficient documentation

## 2011-03-09 DIAGNOSIS — I10 Essential (primary) hypertension: Secondary | ICD-10-CM | POA: Insufficient documentation

## 2011-03-09 LAB — GLUCOSE, CAPILLARY: Glucose-Capillary: 143 mg/dL — ABNORMAL HIGH (ref 70–99)

## 2011-03-31 NOTE — Assessment & Plan Note (Signed)
NAMEMarland Kitchen  Joshua Franco, WIERZBA             ACCOUNT NO.:  [id number]  MEDICAL RECORD NO.:  [id number]  LOCATION:  0405                          FACILITY:  [facility]  PHYSICIAN:  Eulogio Ditch, MD DATE OF BIRTH:  08-07-1990  DATE OF ADMISSION:  02/10/2011 DATE OF DISCHARGE:  02/15/2011                      PSYCHIATRIC ADMISSION ASSESSMENT   TIME:  1336.  IDENTIFYING INFORMATION:  This is a 20 year old Caucasian male, single. This was an involuntary admission.  HISTORY OF PRESENT ILLNESS:  Joshua Franco presented on an involuntary petition complaining of suicidal thoughts that he felt were exacerbated by the recent death of his friend.  He was having difficulty coping, had been making suicidal threats to either drink cologne in order to poison himself or possibly jump out of a moving vehicle.  He also complained of an exacerbation of auditory hallucinations which would occur intermittently with vague commands to harm himself, and would typically be worse later in the day.  PAST PSYCHIATRIC HISTORY:  He had a history of previous admissions to [facility] and was currently followed in a day program associated with the group home where he lives.  He has been previously diagnosed with mood disorder, NOS, and attention deficit hyperactivity disorder.  SOCIAL HISTORY:  Joshua Franco lives in a group home.  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  He has a girlfriend who lives in [place].  Father is deceased.  Mother lives in [place] and he reports a satisfactory relationship with her with regular contact.  No current legal problems.  FAMILY HISTORY:  Father with alcohol and drug history, negative for any current substance abuse.  PAST MEDICAL HISTORY:  Primary care physician is unknown.  Medical problems are diabetes mellitus type 2, hypertension, hyperlipidemia.  PHYSICAL EXAMINATION:  Full physical exam was done in the emergency room and he was also  medically and physically evaluated here on the unit and we found no additional findings from the report noted in the emergency room transcript which is in the record.  Additional labs performed here, however, did note elevated triglycerides at 579 and a hemoglobin A1c of 6.2.  MEDICATIONS:  At the time of admission: 1. Simvastatin 20 mg daily. 2. Risperdal 4 mg q.h.s. 3. Metformin 1000 mg b.i.d. with meals. 4. Divalproex 500 mg, he takes 2 tabs in the morning and 1 tab at     bedtime. 5. Citalopram 20 mg daily.  ALLERGIES: 1. AMOXICILLIN. 2. PENICILLIN.  MENTAL STATUS EXAMINATION:  Fully alert male, flat affect.  Non- pressured speech, has little to offer, but is generally cooperative. Does admit to having suicidal thoughts, but no plan and is denying any auditory hallucinations at this time.  Thinking is goal oriented, clear thoughts.  Sentence structure good.  Denies any immediate plan for suicide.  Feels he can be safe on the unit.  Fully alert and oriented. Insight adequate.  DIAGNOSES:  Axis I: 1. Mood disorder, not otherwise specified. 2. Attention deficit hyperactivity disorder by history. Axis II:  Deferred. Axis III: 1. Hypertension. 2. Diabetes mellitus type 2. 3. Hypertriglyceridemia. 4. Hyperlipidemia. Axis IV:  Significant issues with grief and loss issues over the death of his friend. Axis V:  Current 40, past year 65 estimated.  PLAN:  To voluntarily admit him with a goal of alleviating his suicidal thoughts.  We are going to get an Internal Medicine consult to evaluate his triglycerides and the best course of action for that.  We will continue his involuntary petition and work on stabilization.     Margaret A. Lorin Picket, N.P.   ______________________________ Eulogio Ditch, MD    MAS/MEDQ  D:  03/23/2011  T:  03/23/2011  Job:  [id number]  Electronically Signed by Kari Baars N.P. on 03/28/2011 11:03:28 AM Electronically Signed by Eulogio Ditch  on 03/31/2011 10:11:34 AM

## 2011-04-03 ENCOUNTER — Emergency Department (HOSPITAL_COMMUNITY): Payer: Medicare Other

## 2011-04-03 ENCOUNTER — Other Ambulatory Visit: Payer: Self-pay

## 2011-04-03 ENCOUNTER — Emergency Department (HOSPITAL_COMMUNITY)
Admission: EM | Admit: 2011-04-03 | Discharge: 2011-04-03 | Disposition: A | Discharge status: Home / Self Care | Payer: Medicare Other | Attending: Emergency Medicine | Admitting: Emergency Medicine

## 2011-04-03 DIAGNOSIS — R071 Chest pain on breathing: Secondary | ICD-10-CM | POA: Insufficient documentation

## 2011-04-03 DIAGNOSIS — R0789 Other chest pain: Secondary | ICD-10-CM

## 2011-04-03 MED ORDER — IBUPROFEN 800 MG PO TABS
800.0000 mg | ORAL_TABLET | Freq: Once | ORAL | Status: AC
  Administered 2011-04-03: 800 mg via ORAL
  Filled 2011-04-03: qty 1

## 2011-04-03 NOTE — ED Notes (Signed)
Pt discharge instructions reviewed with pt/aide. Verbalizes understanding.  NAD noted.  VSS.  Pt ambulatory to lobby with guardian.

## 2011-04-03 NOTE — ED Provider Notes (Signed)
History     CSN: [id number] Arrival date & time: 04/03/2011  5:43 PM   First MD Initiated Contact with Patient 04/03/11 2044      No chief complaint on file.   (Consider location/radiation/quality/duration/timing/severity/associated sxs/prior treatment) The history is provided by the patient and medical records.   the patient is a 20 year old male, who complains of intermittent left sided, sharp chest pain.  It lasts up to an hour.  He denies  nausea, vomiting, fevers, chills, shortness of breath, diaphoresis.  He states that the pain lasts up to an hour.  He comes and goes.  It does not radiate anywhere.  He states that he has never had this before.  However, review of the record shows that he presented with similar symptoms in the past and was seen in the ER by Dr. Eula Listen.   No past surgical history on file.  No family history on file.  History  Substance Use Topics  . Smoking status: Not on file  . Smokeless tobacco: Not on file  . Alcohol Use: Not on file      Review of Systems  Constitutional: Negative for fever and diaphoresis.  HENT: Negative for neck pain.   Eyes: Negative for redness.  Respiratory: Negative for cough, chest tightness and shortness of breath.   Cardiovascular: Positive for chest pain. Negative for palpitations.  Gastrointestinal: Negative for nausea, vomiting, abdominal pain and diarrhea.  Musculoskeletal: Negative for back pain.  Neurological: Negative for headaches.  Psychiatric/Behavioral: Negative for confusion.    Allergies  Amoxicillin and Penicillins  Home Medications  No current outpatient prescriptions on file.  BP 125/80  Pulse 83  Temp(Src) 97.1 F (36.2 C) (Oral)  Resp 16  SpO2 96%  Physical Exam  Constitutional: He is oriented to person, place, and time. He appears well-developed and well-nourished. No distress.  HENT:  Head: Normocephalic and atraumatic.  Eyes: EOM are normal. Pupils are equal, round, and reactive to  light.  Neck: Normal range of motion. Neck supple.  Cardiovascular: Normal rate, regular rhythm and normal heart sounds.   No murmur heard. Pulmonary/Chest: Effort normal and breath sounds normal. No respiratory distress. He has no wheezes. He has no rales. He exhibits tenderness.  Abdominal: Soft. Bowel sounds are normal. He exhibits no distension and no mass. There is no tenderness. There is no rebound and no guarding.  Musculoskeletal: Normal range of motion.  Neurological: He is alert and oriented to person, place, and time. No cranial nerve deficit.  Skin: Skin is warm and dry. He is not diaphoretic.  Psychiatric: He has a normal mood and affect. His behavior is normal.    ED Course  Procedures (including critical care time)  Intermittent, sharp, chest pain in young, healthy, male, with no significant past medical history.  He is currently getting inpatient therapy and has no access to drugs.  His EKG is normal.  The chest wall tenderness.  I will perform a chest x-ray, as well, and give him IV Profen, but there is no indication that he has significant illness.  At this time.   ED ECG REPORT   Date: 04/03/2011  EKG Time: 18:21 Rate: 80  Rhythm: normal sinus rhythm,  Axis: nl  Intervals:none  ST&T Change: none  Narrative Interpretation: notmal             MDM  Noncardiac chest pain        Nicholes Stairs, MD 04/03/11 2220

## 2011-10-30 IMAGING — CR DG CHEST 2V
2 series · 2 of 2 positions shown · non-contrast
Comparison: 03/05/2011

CLINICAL DATA: chest pain, suicidal

CHEST - 2 VIEW

[w chest pa]
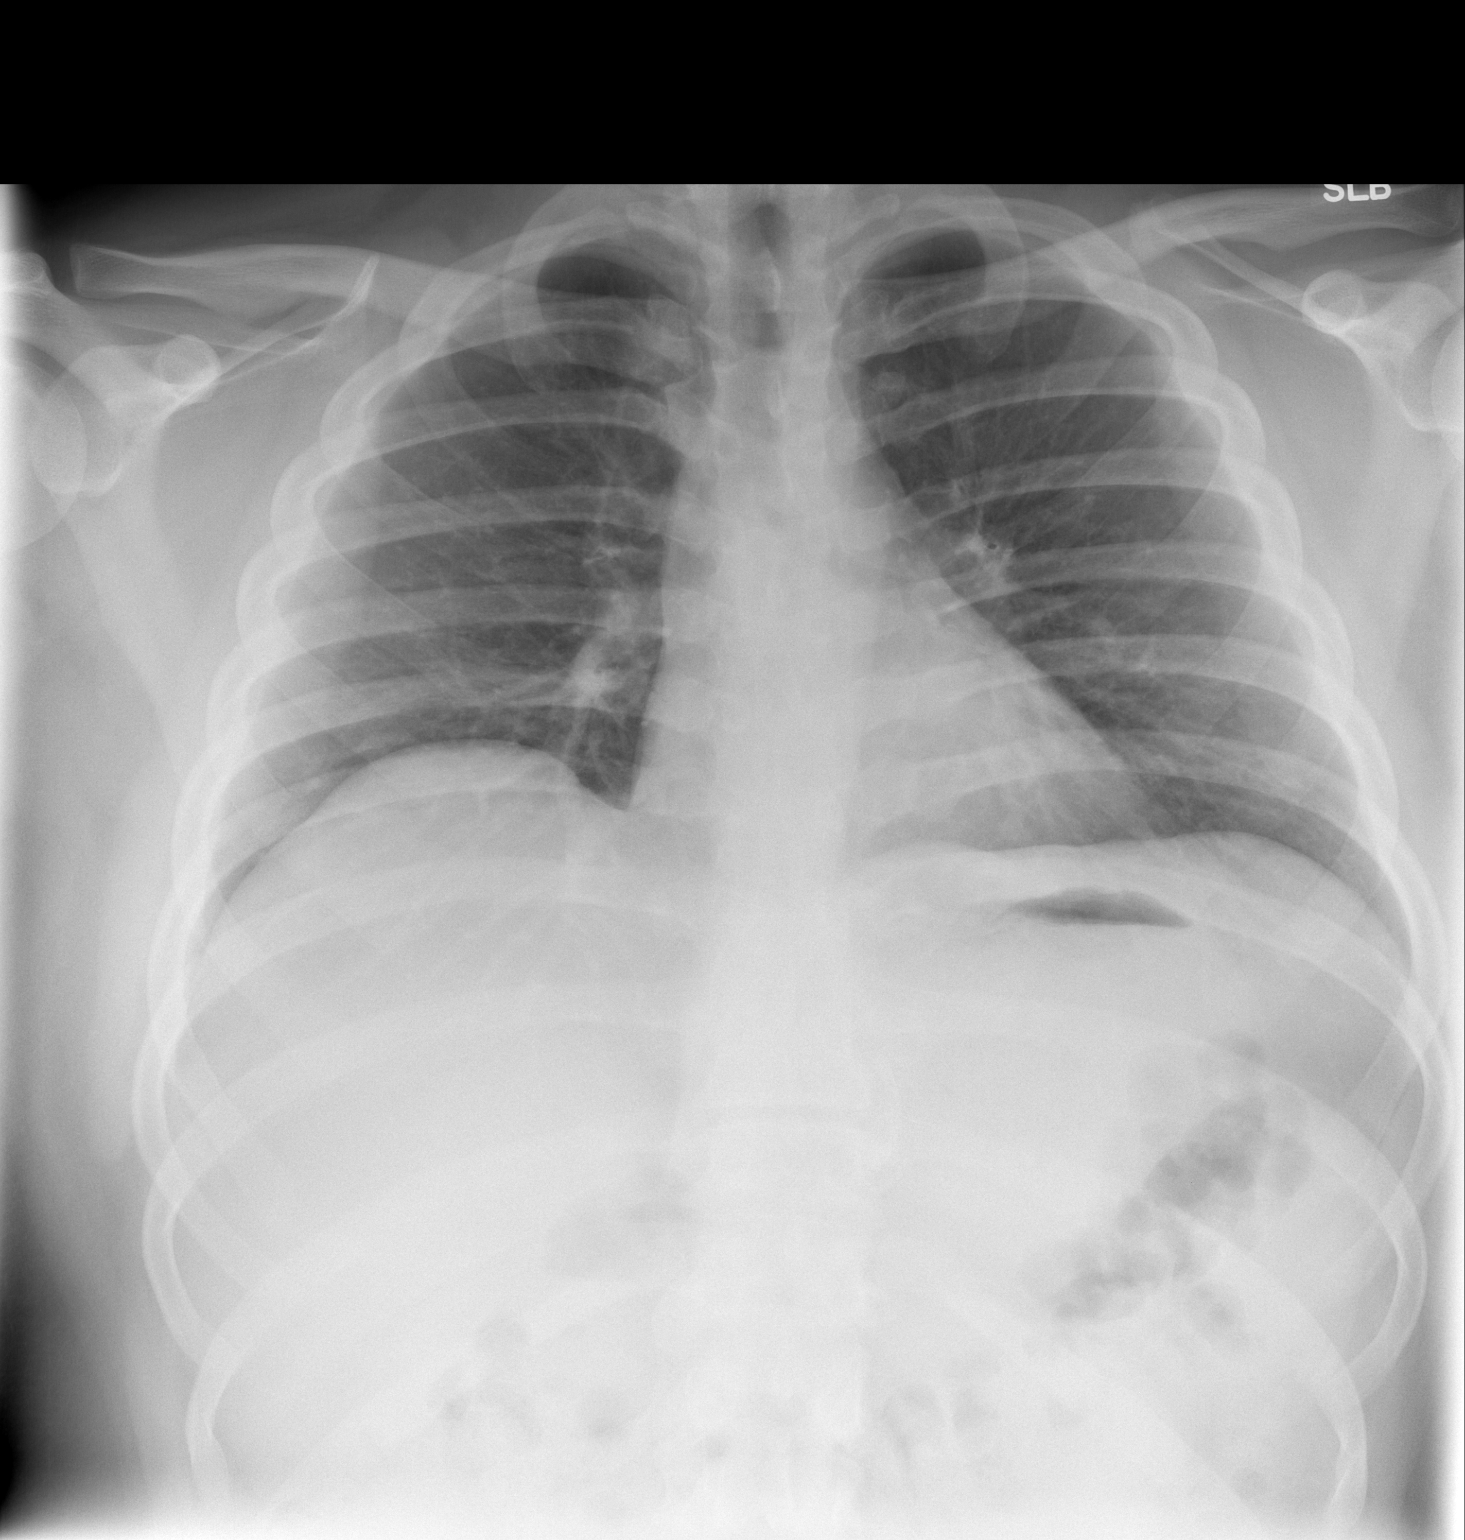

[w chest lat]
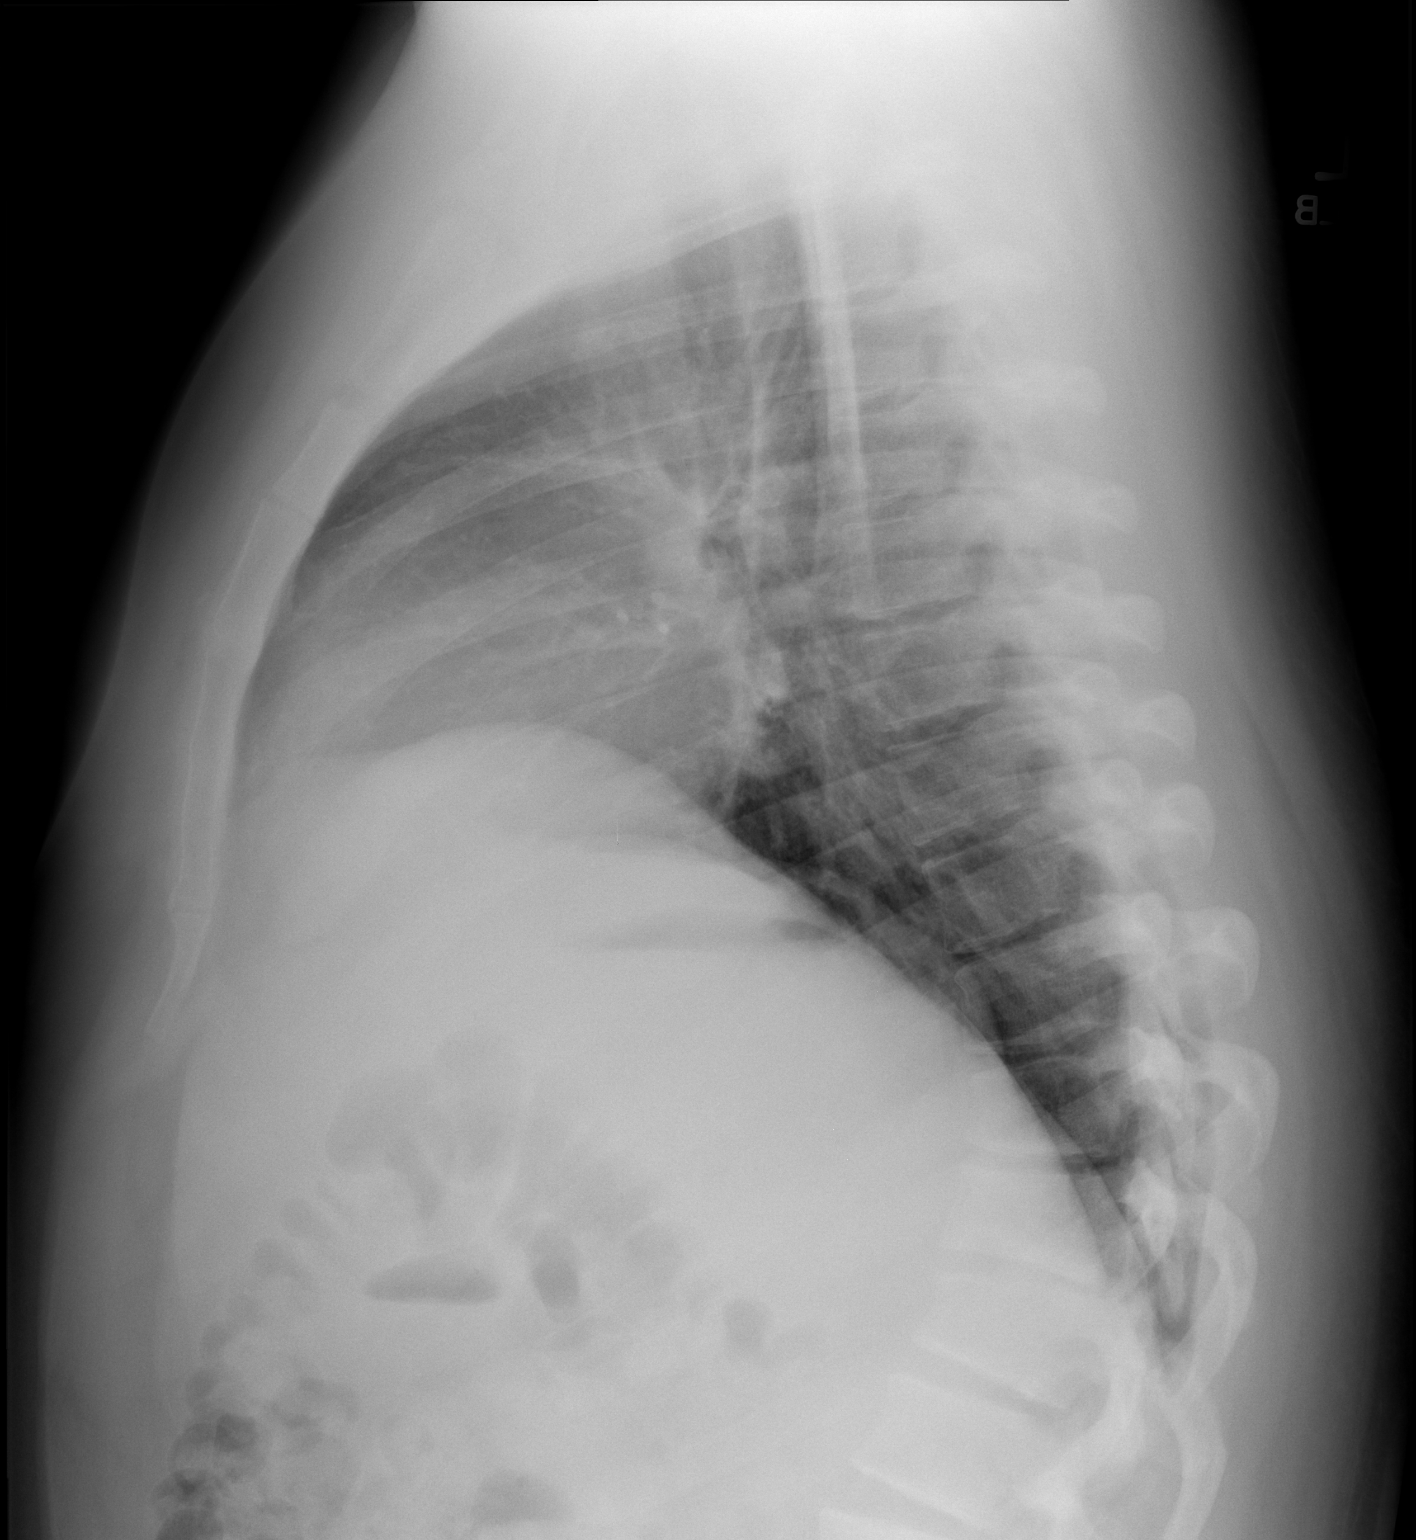

[2 of 2 positions shown; findings below may reference images not displayed]

FINDINGS: Low lung volumes but negative for pneumonia, edema,
effusion or pneumothorax.  Normal heart size and vascularity.
Trachea is midline.
IMPRESSION: Low volume exam.  No acute chest finding.

## 2013-02-12 ENCOUNTER — Inpatient Hospital Stay (HOSPITAL_COMMUNITY)
Admission: AD | Admit: 2013-02-12 | Discharge: 2013-02-14 | DRG: 885 | Disposition: A | Discharge status: Home / Self Care | Payer: Medicare Other | Source: Intra-hospital | Attending: Psychiatry | Admitting: Psychiatry

## 2013-02-12 ENCOUNTER — Emergency Department (EMERGENCY_DEPARTMENT_HOSPITAL)
Admission: EM | Admit: 2013-02-12 | Discharge: 2013-02-12 | Disposition: A | Discharge status: Other Acute Inpatient Hospital | Payer: Medicare Other | Source: Home / Self Care | Attending: Emergency Medicine | Admitting: Emergency Medicine

## 2013-02-12 ENCOUNTER — Encounter (HOSPITAL_COMMUNITY): Payer: Self-pay | Admitting: *Deleted

## 2013-02-12 ENCOUNTER — Encounter (HOSPITAL_COMMUNITY): Payer: Self-pay | Admitting: Emergency Medicine

## 2013-02-12 DIAGNOSIS — R45851 Suicidal ideations: Secondary | ICD-10-CM

## 2013-02-12 DIAGNOSIS — R45850 Homicidal ideations: Secondary | ICD-10-CM

## 2013-02-12 DIAGNOSIS — F329 Major depressive disorder, single episode, unspecified: Secondary | ICD-10-CM

## 2013-02-12 DIAGNOSIS — Z79899 Other long term (current) drug therapy: Secondary | ICD-10-CM

## 2013-02-12 DIAGNOSIS — F315 Bipolar disorder, current episode depressed, severe, with psychotic features: Principal | ICD-10-CM | POA: Diagnosis present

## 2013-02-12 DIAGNOSIS — F3130 Bipolar disorder, current episode depressed, mild or moderate severity, unspecified: Secondary | ICD-10-CM | POA: Diagnosis present

## 2013-02-12 DIAGNOSIS — F79 Unspecified intellectual disabilities: Secondary | ICD-10-CM | POA: Diagnosis present

## 2013-02-12 DIAGNOSIS — E119 Type 2 diabetes mellitus without complications: Secondary | ICD-10-CM | POA: Diagnosis present

## 2013-02-12 DIAGNOSIS — F4310 Post-traumatic stress disorder, unspecified: Secondary | ICD-10-CM

## 2013-02-12 DIAGNOSIS — F259 Schizoaffective disorder, unspecified: Secondary | ICD-10-CM | POA: Diagnosis present

## 2013-02-12 DIAGNOSIS — F319 Bipolar disorder, unspecified: Secondary | ICD-10-CM

## 2013-02-12 DIAGNOSIS — F909 Attention-deficit hyperactivity disorder, unspecified type: Secondary | ICD-10-CM

## 2013-02-12 DIAGNOSIS — F4311 Post-traumatic stress disorder, acute: Secondary | ICD-10-CM | POA: Diagnosis present

## 2013-02-12 HISTORY — DX: Post-traumatic stress disorder, unspecified: F43.10

## 2013-02-12 HISTORY — DX: Unspecified intellectual disabilities: F79

## 2013-02-12 HISTORY — DX: Bipolar disorder, unspecified: F31.9

## 2013-02-12 HISTORY — DX: Type 2 diabetes mellitus without complications: E11.9

## 2013-02-12 LAB — URINALYSIS, ROUTINE W REFLEX MICROSCOPIC
Leukocytes, UA: NEGATIVE
Nitrite: NEGATIVE
Specific Gravity, Urine: 1.029 (ref 1.005–1.030)
pH: 7 (ref 5.0–8.0)

## 2013-02-12 LAB — CBC WITH DIFFERENTIAL/PLATELET
Basophils Relative: 1 % (ref 0–1)
Eosinophils Absolute: 0.2 10*3/uL (ref 0.0–0.7)
Hemoglobin: 11.8 g/dL — ABNORMAL LOW (ref 13.0–17.0)
MCH: 27.6 pg (ref 26.0–34.0)
MCHC: 31.6 g/dL (ref 30.0–36.0)
Monocytes Relative: 8 % (ref 3–12)
Neutrophils Relative %: 44 % (ref 43–77)
RDW: 16 % — ABNORMAL HIGH (ref 11.5–15.5)

## 2013-02-12 LAB — COMPREHENSIVE METABOLIC PANEL
Albumin: 3.2 g/dL — ABNORMAL LOW (ref 3.5–5.2)
Alkaline Phosphatase: 56 U/L (ref 39–117)
BUN: 18 mg/dL (ref 6–23)
Creatinine, Ser: 1.03 mg/dL (ref 0.50–1.35)
Potassium: 4.1 mEq/L (ref 3.5–5.1)
Total Protein: 6.9 g/dL (ref 6.0–8.3)

## 2013-02-12 LAB — RAPID URINE DRUG SCREEN, HOSP PERFORMED
Benzodiazepines: NOT DETECTED
Cocaine: NOT DETECTED

## 2013-02-12 LAB — SALICYLATE LEVEL: Salicylate Lvl: <2 mg/dL — ABNORMAL LOW (ref 2.8–20.0)

## 2013-02-12 LAB — ETHANOL: Alcohol, Ethyl (B): <11 mg/dL (ref 0–11)

## 2013-02-12 LAB — GLUCOSE, CAPILLARY: Glucose-Capillary: 174 mg/dL — ABNORMAL HIGH (ref 70–99)

## 2013-02-12 LAB — ACETAMINOPHEN LEVEL: Acetaminophen (Tylenol), Serum: <15 ug/mL (ref 10–30)

## 2013-02-12 MED ORDER — DIPHENHYDRAMINE HCL 25 MG PO CAPS
25.0000 mg | ORAL_CAPSULE | Freq: Every day | ORAL | Status: DC
  Administered 2013-02-12: 25 mg via ORAL
  Filled 2013-02-12 (×2): qty 1

## 2013-02-12 MED ORDER — MAGNESIUM HYDROXIDE 400 MG/5ML PO SUSP: 30.0000 mL | Freq: Every day | ORAL | Status: DC | PRN

## 2013-02-12 MED ORDER — RISPERIDONE 2 MG PO TABS
4.0000 mg | ORAL_TABLET | Freq: Every day | ORAL | Status: DC
  Administered 2013-02-12: 4 mg via ORAL
  Filled 2013-02-12: qty 2

## 2013-02-12 MED ORDER — PANTOPRAZOLE SODIUM 20 MG PO TBEC
20.0000 mg | DELAYED_RELEASE_TABLET | Freq: Two times a day (BID) | ORAL | Status: DC
  Administered 2013-02-12: 20 mg via ORAL
  Filled 2013-02-12: qty 1

## 2013-02-12 MED ORDER — METFORMIN HCL 500 MG PO TABS
1000.0000 mg | ORAL_TABLET | Freq: Two times a day (BID) | ORAL | Status: DC
  Filled 2013-02-12: qty 2

## 2013-02-12 MED ORDER — ACETAMINOPHEN 325 MG PO TABS: 650.0000 mg | ORAL_TABLET | Freq: Four times a day (QID) | ORAL | Status: DC | PRN

## 2013-02-12 MED ORDER — ALUM & MAG HYDROXIDE-SIMETH 200-200-20 MG/5ML PO SUSP: 30.0000 mL | ORAL | Status: DC | PRN

## 2013-02-12 MED ORDER — SIMVASTATIN 20 MG PO TABS
20.0000 mg | ORAL_TABLET | Freq: Every evening | ORAL | Status: DC
  Administered 2013-02-12: 20 mg via ORAL
  Filled 2013-02-12: qty 1

## 2013-02-12 MED ORDER — CARVEDILOL 6.25 MG PO TABS
6.2500 mg | ORAL_TABLET | Freq: Two times a day (BID) | ORAL | Status: DC
  Administered 2013-02-12: 6.25 mg via ORAL
  Filled 2013-02-12 (×2): qty 1

## 2013-02-12 MED ORDER — ACETAMINOPHEN 325 MG PO TABS: 650.0000 mg | ORAL_TABLET | ORAL | Status: DC | PRN

## 2013-02-12 NOTE — ED Notes (Signed)
Patient has 1 bag of belongings: 1 pair of sweat pants, 1 pair of shoes, shirt and boxers. -- Patient belongings located behind nurses station in triage.
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
## 2013-02-12 NOTE — ED Notes (Signed)
Patient and belongings both wanded by security.  

## 2013-02-12 NOTE — ED Notes (Signed)
Patient in blue scrubs and red socks.  

## 2013-02-12 NOTE — ED Notes (Signed)
Per pt, was at [facility] for same symptoms-states they did not help me-states he is taking meds-SI/HI

## 2013-02-12 NOTE — Consult Note (Signed)
[facility] Face-to-Face Psychiatry Consult   Reason for Consult:  22 year old male with bipolar presented to [facility] reporting SI and HI.  Referring Physician: Muneer Leider is an 22 y.o. male.  Assessment: AXIS I:  ADHD, hyperactive type, Depressive Disorder NOS, Post Traumatic Stress Disorder and bipolar disorder, mental retardation  AXIS II:  Deferred AXIS III:   Past Medical History  Diagnosis Date  . Diabetes mellitus without complication   . Bipolar 1 disorder   . PTSD (post-traumatic stress disorder)   . MR (mental retardation)    AXIS IV:  other psychosocial or environmental problems, problems related to social environment and problems with primary support group AXIS V:  11-20 some danger of hurting self or others possible OR occasionally fails to maintain minimal personal hygiene OR gross impairment in communication  Plan:  Recommend psychiatric Inpatient admission when medically cleared.  Subjective:   Dawaun Brancato is a 22 y.o. male who presented to [facility] with SI and HI.  He reports that he has SI with a plan to cut himself and put a gun to his head.  He reports that he also has HI towards "the group home" without a reported plan and states that voices are telling him to do so.  He explains that his symptoms have been present since the age of 19 after seeing his father kill himself.  He was seen at [facility] on 02/08/13 and 02/10/13 and was discharged to home.  When asked if he has adequate family support/community support he responds yes and references his maternal grandmother whom he then states that he does not see nor communicate with.  He states that he has had an inpatient stay at [facility] in the past and reports improvement in symptoms.  He states, "I want help".  Reports being told that he was "playing around and that he was not being serious" prior to being discharged from [facility].  He expresses concern as he reports that he needs help and does not wish to go to jail for  hurting someone.  He is calm and cooperative during assessment.  Does presently have SI, HI, however he does contract for safety.            Past Psychiatric History: Past Medical History  Diagnosis Date  . Diabetes mellitus without complication   . Bipolar 1 disorder   . PTSD (post-traumatic stress disorder)   . MR (mental retardation)     reports that he has been smoking.  He does not have any smokeless tobacco history on file. He reports that he does not drink alcohol or use illicit drugs. No family history on file.         Allergies:   Allergies  Allergen Reactions  . Amoxicillin Other (See Comments)    Throat swelling  . Penicillins Other (See Comments)    Racing heart     Objective: Blood pressure 105/66, pulse 22, temperature 98.1 F (36.7 C), temperature source Oral, resp. rate 22, SpO2 93.00%.There is no height or weight on file to calculate BMI. Results for orders placed during the hospital encounter of 02/12/13 (from the past 72 hour(s))  URINALYSIS, ROUTINE W REFLEX MICROSCOPIC     Status: None   Collection Time    02/12/13  2:32 PM      Result Value Range   Color, Urine YELLOW  YELLOW   APPearance CLEAR  CLEAR   Specific Gravity, Urine 1.029  1.005 - 1.030   pH 7.0  5.0 - 8.0   Glucose, UA NEGATIVE  NEGATIVE mg/dL   Hgb urine dipstick NEGATIVE  NEGATIVE   Bilirubin Urine NEGATIVE  NEGATIVE   Ketones, ur NEGATIVE  NEGATIVE mg/dL   Protein, ur NEGATIVE  NEGATIVE mg/dL   Urobilinogen, UA 0.2  0.0 - 1.0 mg/dL   Nitrite NEGATIVE  NEGATIVE   Leukocytes, UA NEGATIVE  NEGATIVE   Comment: MICROSCOPIC NOT DONE ON URINES WITH NEGATIVE PROTEIN, BLOOD, LEUKOCYTES, NITRITE, OR GLUCOSE <1000 mg/dL.  URINE RAPID DRUG SCREEN (HOSP PERFORMED)     Status: None   Collection Time    02/12/13  2:32 PM      Result Value Range   Opiates NONE DETECTED  NONE DETECTED   Cocaine NONE DETECTED  NONE DETECTED   Benzodiazepines NONE DETECTED  NONE DETECTED   Amphetamines NONE  DETECTED  NONE DETECTED   Tetrahydrocannabinol NONE DETECTED  NONE DETECTED   Barbiturates NONE DETECTED  NONE DETECTED   Comment:            DRUG SCREEN FOR MEDICAL PURPOSES     ONLY.  IF CONFIRMATION IS NEEDED     FOR ANY PURPOSE, NOTIFY LAB     WITHIN 5 DAYS.                LOWEST DETECTABLE LIMITS     FOR URINE DRUG SCREEN     Drug Class       Cutoff (ng/mL)     Amphetamine      1000     Barbiturate      200     Benzodiazepine   200     Tricyclics       300     Opiates          300     Cocaine          300     THC              50  CBC WITH DIFFERENTIAL     Status: Abnormal   Collection Time    02/12/13  2:40 PM      Result Value Range   WBC 9.5  4.0 - 10.5 K/uL   RBC 4.27  4.22 - 5.81 MIL/uL   Hemoglobin 11.8 (*) 13.0 - 17.0 g/dL   HCT 16.1 (*) 09.6 - 04.5 %   MCV 87.4  78.0 - 100.0 fL   MCH 27.6  26.0 - 34.0 pg   MCHC 31.6  30.0 - 36.0 g/dL   RDW 40.9 (*) 81.1 - 91.4 %   Platelets 331  150 - 400 K/uL   Neutrophils Relative % 44  43 - 77 %   Neutro Abs 4.1  1.7 - 7.7 K/uL   Lymphocytes Relative 46  12 - 46 %   Lymphs Abs 4.4 (*) 0.7 - 4.0 K/uL   Monocytes Relative 8  3 - 12 %   Monocytes Absolute 0.7  0.1 - 1.0 K/uL   Eosinophils Relative 2  0 - 5 %   Eosinophils Absolute 0.2  0.0 - 0.7 K/uL   Basophils Relative 1  0 - 1 %   Basophils Absolute 0.1  0.0 - 0.1 K/uL  COMPREHENSIVE METABOLIC PANEL     Status: Abnormal   Collection Time    02/12/13  2:40 PM      Result Value Range   Sodium 138  135 - 145 mEq/L   Potassium 4.1  3.5 - 5.1 mEq/L  Chloride 102  96 - 112 mEq/L   CO2 23  19 - 32 mEq/L   Glucose, Bld 93  70 - 99 mg/dL   BUN 18  6 - 23 mg/dL   Creatinine, Ser 1.61  0.50 - 1.35 mg/dL   Calcium 9.2  8.4 - 09.6 mg/dL   Total Protein 6.9  6.0 - 8.3 g/dL   Albumin 3.2 (*) 3.5 - 5.2 g/dL   AST 27  0 - 37 U/L   ALT 36  0 - 53 U/L   Alkaline Phosphatase 56  39 - 117 U/L   Total Bilirubin 0.4  0.3 - 1.2 mg/dL   GFR calc non Af Amer >90  >90 mL/min   GFR  calc Af Amer >90  >90 mL/min   Comment: (NOTE)     The eGFR has been calculated using the CKD EPI equation.     This calculation has not been validated in all clinical situations.     eGFR's persistently <90 mL/min signify possible Chronic Kidney     Disease.  ETHANOL     Status: None   Collection Time    02/12/13  2:40 PM      Result Value Range   Alcohol, Ethyl (B) <11  0 - 11 mg/dL   Comment:            LOWEST DETECTABLE LIMIT FOR     SERUM ALCOHOL IS 11 mg/dL     FOR MEDICAL PURPOSES ONLY  ACETAMINOPHEN LEVEL     Status: None   Collection Time    02/12/13  2:40 PM      Result Value Range   Acetaminophen (Tylenol), Serum <15.0  10 - 30 ug/mL   Comment:            THERAPEUTIC CONCENTRATIONS VARY     SIGNIFICANTLY. A RANGE OF 10-30     ug/mL MAY BE AN EFFECTIVE     CONCENTRATION FOR MANY PATIENTS.     HOWEVER, SOME ARE BEST TREATED     AT CONCENTRATIONS OUTSIDE THIS     RANGE.     ACETAMINOPHEN CONCENTRATIONS     >150 ug/mL AT 4 HOURS AFTER     INGESTION AND >50 ug/mL AT 12     HOURS AFTER INGESTION ARE     OFTEN ASSOCIATED WITH TOXIC     REACTIONS.  SALICYLATE LEVEL     Status: Abnormal   Collection Time    02/12/13  2:40 PM      Result Value Range   Salicylate Lvl <2.0 (*) 2.8 - 20.0 mg/dL  GLUCOSE, CAPILLARY     Status: Abnormal   Collection Time    02/12/13  6:03 PM      Result Value Range   Glucose-Capillary 174 (*) 70 - 99 mg/dL   Labs reviewed- stable; Valproic acid level added.   Current Facility-Administered Medications  Medication Dose Route Frequency Provider Last Rate Last Dose  . acetaminophen (TYLENOL) tablet 650 mg  650 mg Oral Q4H PRN Jillyn Ledger, PA-C      . carvedilol (COREG) tablet 6.25 mg  6.25 mg Oral BID WC Evanna Janann August, NP      . diphenhydrAMINE (BENADRYL) tablet 25 mg  25 mg Oral QHS Evanna Janann August, NP      . Melene Muller ON 02/13/2013] metFORMIN (GLUCOPHAGE) tablet 1,000 mg  1,000 mg Oral BID WC Evanna Cori Burkett, NP      .  pantoprazole (PROTONIX) EC tablet 20 mg  20 mg Oral BID Evanna Cori Merry Proud, NP      . risperiDONE (RISPERDAL) tablet 4 mg  4 mg Oral QHS Evanna Cori Burkett, NP      . simvastatin (ZOCOR) tablet 20 mg  20 mg Oral QPM Evanna Janann August, NP       Current Outpatient Prescriptions  Medication Sig Dispense Refill  . carvedilol (COREG) 6.25 MG tablet Take 6.25 mg by mouth 2 (two) times daily with a meal.      . citalopram (CELEXA) 20 MG tablet Take 20 mg by mouth daily.      . diphenhydrAMINE (BENADRYL) 25 MG tablet Take 25 mg by mouth at bedtime.      . divalproex (DEPAKOTE ER) 500 MG 24 hr tablet Take 1,000 mg by mouth 2 (two) times daily.      . divalproex (DEPAKOTE ER) 500 MG 24 hr tablet Take 1,500 mg by mouth at bedtime.      . fenofibrate micronized (LOFIBRA) 134 MG capsule Take 134 mg by mouth daily before breakfast.      . metFORMIN (GLUCOPHAGE) 500 MG tablet Take 1,000 mg by mouth 2 (two) times daily with a meal.      . pantoprazole (PROTONIX) 20 MG tablet Take 20 mg by mouth 2 (two) times daily.      . risperidone (RISPERDAL) 4 MG tablet Take 4 mg by mouth at bedtime.      . simvastatin (ZOCOR) 20 MG tablet Take 20 mg by mouth every evening.        Psychiatric Specialty Exam:     Blood pressure 105/66, pulse 22, temperature 98.1 F (36.7 C), temperature source Oral, resp. rate 22, SpO2 93.00%.There is no height or weight on file to calculate BMI.  General Appearance: Well Groomed  Patent attorney::  Fair  Speech:  Clear and Coherent and Normal Rate  Volume:  Normal  Mood:  Anxious  Affect:  Appropriate  Thought Process:  Coherent and mental retardation  Orientation:  Full (Time, Place, and Person)  Thought Content:  WDL  Suicidal Thoughts:  Yes.  with intent/plan  Homicidal Thoughts:  Yes.  with intent/plan  Memory:  Immediate;   Fair  Judgement:  Impaired  Insight:  Fair  Psychomotor Activity:  Negative  Concentration:  Fair  Recall:  Fair  Akathisia:  Negative  Handed:     AIMS (if indicated):     Assets:  Communication Skills, desire for improvement  Sleep:      Treatment Plan Summary: 1) Meeting inpatient criteria for crisis management, safety, and stabilization of bipolar 1 disorder, PTSD, SI and HI bed available on [address] [facility], to be admitted to [id number] 2) SW to aid or facilitate in outpatient support services and Psychiatric management  3) Management of applicable co-mobidities  4) Administration of Psychotropics and/or psychotherapuetic interventions   Kizzie Fantasia CORI 02/12/2013 9:03 PM  Reviewed the information documented and agree with the treatment plan.  Cheston Coury,JANARDHAHA R. 02/13/2013 12:55 PM

## 2013-02-12 NOTE — ED Notes (Signed)
Belongings placed in locker 35. 

## 2013-02-13 DIAGNOSIS — F4311 Post-traumatic stress disorder, acute: Secondary | ICD-10-CM | POA: Diagnosis present

## 2013-02-13 DIAGNOSIS — F79 Unspecified intellectual disabilities: Secondary | ICD-10-CM

## 2013-02-13 DIAGNOSIS — F4310 Post-traumatic stress disorder, unspecified: Secondary | ICD-10-CM

## 2013-02-13 DIAGNOSIS — F3130 Bipolar disorder, current episode depressed, mild or moderate severity, unspecified: Secondary | ICD-10-CM | POA: Diagnosis present

## 2013-02-13 DIAGNOSIS — F315 Bipolar disorder, current episode depressed, severe, with psychotic features: Principal | ICD-10-CM

## 2013-02-13 LAB — GLUCOSE, CAPILLARY: Glucose-Capillary: 183 mg/dL — ABNORMAL HIGH (ref 70–99)

## 2013-02-13 MED ORDER — METFORMIN HCL 500 MG PO TABS
1000.0000 mg | ORAL_TABLET | Freq: Two times a day (BID) | ORAL | Status: DC
  Administered 2013-02-13 – 2013-02-14 (×2): 1000 mg via ORAL
  Filled 2013-02-13 (×6): qty 2

## 2013-02-13 MED ORDER — SIMVASTATIN 20 MG PO TABS
20.0000 mg | ORAL_TABLET | Freq: Every evening | ORAL | Status: DC
  Administered 2013-02-13: 20 mg via ORAL
  Filled 2013-02-13 (×3): qty 1

## 2013-02-13 MED ORDER — FLUPHENAZINE HCL 5 MG PO TABS
5.0000 mg | ORAL_TABLET | Freq: Every day | ORAL | Status: DC
  Administered 2013-02-13: 5 mg via ORAL
  Filled 2013-02-13: qty 3
  Filled 2013-02-13 (×2): qty 1

## 2013-02-13 MED ORDER — NICOTINE 21 MG/24HR TD PT24
21.0000 mg | MEDICATED_PATCH | Freq: Every day | TRANSDERMAL | Status: DC
  Administered 2013-02-13: 21 mg via TRANSDERMAL
  Filled 2013-02-13 (×4): qty 1

## 2013-02-13 MED ORDER — FLUOXETINE HCL 20 MG PO CAPS
20.0000 mg | ORAL_CAPSULE | Freq: Every day | ORAL | Status: DC
  Administered 2013-02-13 – 2013-02-14 (×2): 20 mg via ORAL
  Filled 2013-02-13 (×4): qty 1
  Filled 2013-02-13: qty 3

## 2013-02-13 MED ORDER — FENOFIBRATE 54 MG PO TABS
54.0000 mg | ORAL_TABLET | Freq: Every day | ORAL | Status: DC
  Administered 2013-02-13 – 2013-02-14 (×2): 54 mg via ORAL
  Filled 2013-02-13 (×4): qty 1

## 2013-02-13 MED ORDER — DIPHENHYDRAMINE HCL 25 MG PO CAPS
25.0000 mg | ORAL_CAPSULE | Freq: Every evening | ORAL | Status: DC | PRN
  Administered 2013-02-13: 25 mg via ORAL

## 2013-02-13 MED ORDER — PANTOPRAZOLE SODIUM 20 MG PO TBEC
20.0000 mg | DELAYED_RELEASE_TABLET | Freq: Two times a day (BID) | ORAL | Status: DC
  Administered 2013-02-13 – 2013-02-14 (×2): 20 mg via ORAL
  Filled 2013-02-13 (×6): qty 1

## 2013-02-13 MED ORDER — CARBAMAZEPINE ER 200 MG PO CP12
200.0000 mg | ORAL_CAPSULE | Freq: Two times a day (BID) | ORAL | Status: DC
  Administered 2013-02-13 – 2013-02-14 (×2): 200 mg via ORAL
  Filled 2013-02-13 (×6): qty 1

## 2013-02-13 MED ORDER — INFLUENZA VAC SPLIT QUAD 0.5 ML IM SUSP
0.5000 mL | INTRAMUSCULAR | Status: AC
  Administered 2013-02-14: 0.5 mL via INTRAMUSCULAR
  Filled 2013-02-13: qty 0.5

## 2013-02-13 MED ORDER — BENZTROPINE MESYLATE 1 MG PO TABS
1.0000 mg | ORAL_TABLET | Freq: Every day | ORAL | Status: DC
  Administered 2013-02-13: 1 mg via ORAL
  Filled 2013-02-13: qty 1
  Filled 2013-02-13: qty 3
  Filled 2013-02-13: qty 1

## 2013-02-13 MED ORDER — CARVEDILOL 6.25 MG PO TABS
6.2500 mg | ORAL_TABLET | Freq: Two times a day (BID) | ORAL | Status: DC
  Administered 2013-02-13 – 2013-02-14 (×2): 6.25 mg via ORAL
  Filled 2013-02-13 (×6): qty 1
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
## 2013-02-13 NOTE — [facility] Suicide Risk Assessment (Signed)
Suicide Risk Assessment  Admission Assessment     Nursing information obtained from:  Patient;Review of record Demographic factors:  Male;Adolescent or young adult;Caucasian;Low socioeconomic status;Unemployed Current Mental Status:  Suicidal ideation indicated by patient;Self-harm thoughts;Thoughts of violence towards others Loss Factors:  Decline in physical health;Financial problems / change in socioeconomic status Historical Factors:  Prior suicide attempts;Family history of mental illness or substance abuse Risk Reduction Factors:  Positive social support;Positive therapeutic relationship  CLINICAL FACTORS:   Severe Anxiety and/or Agitation Bipolar Disorder:   Depressive phase Currently Psychotic Previous Psychiatric Diagnoses and Treatments  COGNITIVE FEATURES THAT CONTRIBUTE TO RISK:  Closed-mindedness Polarized thinking    SUICIDE RISK:   Moderate:  Frequent suicidal ideation with limited intensity, and duration, some specificity in terms of plans, no associated intent, good self-control, limited dysphoria/symptomatology, some risk factors present, and identifiable protective factors, including available and accessible social support.  PLAN OF CARE:1. Admit for crisis management and stabilization. 2. Medication management to reduce current symptoms to base line and improve the     patient's overall level of functioning 3. Treat health problems as indicated. 4. Develop treatment plan to decrease risk of relapse upon discharge and the need for     readmission. 5. Psycho-social education regarding relapse prevention and self care. 6. Health care follow up as needed for medical problems. 7. Restart home medications where appropriate.   I certify that inpatient services furnished can reasonably be expected to improve the patient's condition.  Sonya Gunnoe,MD 02/13/2013, 11:12 AM
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
## 2013-02-13 NOTE — Progress Notes (Signed)
D: Patient denies visual hallucinations. The patient is positive for SI and HI but verbally contracts for safety. The patient states that he has HI towards the people at his group home and that he just "wants to kill them" although he can't specifically say why. The patient patient has a depressed mood and affect. The patient is isolating to his room throughout the day.  A: Patient given emotional support from RN. Patient encouraged to come to staff with concerns and/or questions. Patient's medication routine continued. Patient's orders and plan of care reviewed. Patient encouraged to attend groups on unit.  R: Patient remains depressed, blunted and isolative. Will continue to monitor patient q15 minutes for safety.

## 2013-02-13 NOTE — BHH Group Notes (Signed)
BHH Group Notes:  (Counselor/Nursing/MHT/Case Management/Adjunct)  02/13/2013 1:15PM  Type of Therapy:  Group Therapy  Participation Level:  Active  Participation Quality:  Appropriate  Affect:  Flat  Cognitive:  Oriented  Insight:  Improving  Engagement in Group:  Limited  Engagement in Therapy:  Limited  Modes of Intervention:  Discussion, Exploration and Socialization  Summary of Progress/Problems: The topic for group was balance in life.  Pt participated in the discussion about when their life was in balance and out of balance and how this feels.  Pt discussed ways to get back in balance and short term goals they can work on to get where they want to be.   Joshua Franco states that he is depressed, has SI and HI and consequently is unbalanced.  However, he presents with incongruent affect, so it is difficult to determine the validity of his statements.  States he has recently felt very hopeless.  Identified nurturing pets as something that helps him move from unbalance to balance.  After group took me aside and asked me to call 2 of his workers.  Gave me names and numbers.   Joshua Franco 02/13/2013 3:10 PM

## 2013-02-13 NOTE — Care Management Utilization Note (Signed)
Per State Regulation 482.30  The chart was reviewed for necessity with respect to the patient's Admission/ Duration of stay. Admission 02/12/13  Next Review Date: 02/15/13  Lacinda Axon, RN, BSN
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
## 2013-02-13 NOTE — BHH Suicide Risk Assessment (Signed)
BHH INPATIENT:  Family/Significant Other Suicide Prevention Education  Suicide Prevention Education:  Patient Discharged to Other Healthcare Facility:  Suicide Prevention Education Not Provided:Returning to [facility] and continuing with same treatment team through [facility].  The patient is discharging to another healthcare facility for continuation of treatment.  The patient's medical information, including suicide ideations and risk factors, are a part of the medical information shared with the receiving healthcare facility.  Joshua Franco 02/13/2013, 4:33 PM
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
## 2013-02-13 NOTE — Progress Notes (Signed)
Pt went to [employer]

## 2013-02-13 NOTE — Progress Notes (Signed)
Patient ID: Joshua Franco, male   DOB: Jun 08, 1990, 22 y.o.   MRN: [id number]  D:  Pt +ve for HI/AH, pt says he still hears the voice of his dead father telling him to kill self. Pt denies SI/VH/ pain at this time. Pt is pleasant and cooperative.   A: Pt was offered support and encouragement. Pt was given scheduled medications. Pt was encourage to attend groups. Q 15 minute checks were done for safety.   R:Pt attends groups and interacts well with peers and staff. Pt is taking medication. Pt has no complaints at this time.Pt receptive to treatment and safety maintained on unit.

## 2013-02-13 NOTE — ED Provider Notes (Signed)
Medical screening examination/treatment/procedure(s) were performed by non-physician practitioner and as supervising physician I was immediately available for consultation/collaboration. Devoria Albe, MD, FACEP   Ward Givens, MD 02/13/13 [id number]
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
## 2013-02-14 DIAGNOSIS — F259 Schizoaffective disorder, unspecified: Secondary | ICD-10-CM

## 2013-02-14 LAB — GLUCOSE, CAPILLARY
Glucose-Capillary: 203 mg/dL — ABNORMAL HIGH (ref 70–99)
Glucose-Capillary: 134 mg/dL — ABNORMAL HIGH (ref 70–99)
Glucose-Capillary: 169 mg/dL — ABNORMAL HIGH (ref 70–99)
Glucose-Capillary: 265 mg/dL — ABNORMAL HIGH (ref 70–99)

## 2013-02-14 MED ORDER — CARBAMAZEPINE ER 100 MG PO CP12
200.0000 mg | ORAL_CAPSULE | Freq: Two times a day (BID) | ORAL | Status: DC
  Filled 2013-02-14 (×4): qty 12

## 2013-02-14 MED ORDER — BENZTROPINE MESYLATE 1 MG PO TABS: 1.0000 mg | ORAL_TABLET | Freq: Every day | ORAL | Status: DC

## 2013-02-14 MED ORDER — PANTOPRAZOLE SODIUM 20 MG PO TBEC: 20.0000 mg | DELAYED_RELEASE_TABLET | Freq: Two times a day (BID) | ORAL | Status: DC

## 2013-02-14 MED ORDER — FLUOXETINE HCL 20 MG PO CAPS: 20.0000 mg | ORAL_CAPSULE | Freq: Every day | ORAL | Status: DC

## 2013-02-14 MED ORDER — SIMVASTATIN 20 MG PO TABS: 20.0000 mg | ORAL_TABLET | Freq: Every evening | ORAL | Status: DC

## 2013-02-14 MED ORDER — DIPHENHYDRAMINE HCL 25 MG PO TABS: 25.0000 mg | ORAL_TABLET | Freq: Every day | ORAL | Status: DC

## 2013-02-14 MED ORDER — FENOFIBRATE MICRONIZED 134 MG PO CAPS: 134.0000 mg | ORAL_CAPSULE | Freq: Every day | ORAL | Status: DC

## 2013-02-14 MED ORDER — CARBAMAZEPINE ER 200 MG PO CP12: 200.0000 mg | ORAL_CAPSULE | Freq: Two times a day (BID) | ORAL | Status: DC

## 2013-02-14 MED ORDER — CARVEDILOL 6.25 MG PO TABS: 6.2500 mg | ORAL_TABLET | Freq: Two times a day (BID) | ORAL | Status: DC

## 2013-02-14 MED ORDER — METFORMIN HCL 500 MG PO TABS: 1000.0000 mg | ORAL_TABLET | Freq: Two times a day (BID) | ORAL | Status: DC

## 2013-02-14 MED ORDER — FLUPHENAZINE HCL 5 MG PO TABS: 5.0000 mg | ORAL_TABLET | Freq: Every day | ORAL | Status: DC

## 2013-02-14 NOTE — Progress Notes (Signed)
Recreation Therapy Notes   Date: 09.26.2014 Time: 9:30am Location: [address] Dayroom  Group Topic: Coping Skills  Goal Area(s) Addresses:  Patient will successfully express themselves using art.   Behavioral Response: Did not attend  Jearl Klinefelter, LRT/CTRS  Jearl Klinefelter 02/14/2013 12:53 PM

## 2013-02-14 NOTE — Progress Notes (Signed)
[facility] Adult Case Management Discharge Plan :  Will you be returning to the same living situation after discharge: Yes,  group home At discharge, do you have transportation home?:Yes,  [facility] Do you have the ability to pay for your medications:Yes,  MCD  Release of information consent forms completed and in the chart;  Patient's signature needed at discharge.  Patient to Follow up at: Follow-up Information   Follow up with [facility]. (Go to your next scheduled appointment. Alveta Heimlich knows when it is.)    Contact information:   [address]  [place]  [phone]        Patient denies SI/HI:   Yes,  yes    Safety Planning and Suicide Prevention discussed:  Yes,  yes  Daryel Gerald B 02/14/2013, 10:05 AM

## 2013-02-14 NOTE — Progress Notes (Signed)
Pt discharged per MD orders; pt currently denies SI/HI and auditory/visual hallucinations; pt was given education by RN regarding follow-up appointments and medications and pt denied any questions or concerns about these instructions; pt was then escorted to search room to retrieve his belongings by RN before being discharged to hospital lobby. 
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
## 2013-02-14 NOTE — Tx Team (Signed)
  Interdisciplinary Treatment Plan Update   Date Reviewed:  02/14/2013  Time Reviewed:  10:04 AM  Progress in Treatment:   Attending groups: Yes Participating in groups: Yes Taking medication as prescribed: Yes  Tolerating medication: Yes Family/Significant other contact made: Yes  Patient understands diagnosis: Yes  Discussing patient identified problems/goals with staff: Yes Medical problems stabilized or resolved: Yes Denies suicidal/homicidal ideation: Yes Patient has not harmed self or others: Yes  For review of initial/current patient goals, please see plan of care.  Estimated Length of Stay:  D/C today  Reason for Continuation of Hospitalization:   New Problems/Goals identified:  N/A  Discharge Plan or Barriers:   return to the group home, follow up outpt  Additional Comments:  Attendees:  Signature: Thedore Mins, MD 02/14/2013 10:04 AM   Signature: Richelle Ito, LCSW 02/14/2013 10:04 AM  Signature: Fransisca Kaufmann, NP 02/14/2013 10:04 AM  Signature: Joslyn Devon, RN 02/14/2013 10:04 AM  Signature: Liborio Nixon, RN 02/14/2013 10:04 AM  Signature:  02/14/2013 10:04 AM  Signature:   02/14/2013 10:04 AM  Signature:    Signature:    Signature:    Signature:    Signature:    Signature:      Scribe for Treatment Team:   Richelle Ito, LCSW  02/14/2013 10:04 AM

## 2013-02-14 NOTE — [facility] Suicide Risk Assessment (Signed)
Suicide Risk Assessment  Discharge Assessment     Demographic Factors:  Male, Adolescent or young adult, Low socioeconomic status, Unemployed and lives in a group home  Mental Status Per Nursing Assessment::   On Admission:  Suicidal ideation indicated by patient;Self-harm thoughts;Thoughts of violence towards others  Current Mental Status by Physician: patient denies suicidal ideation, intent or plan  Loss Factors: Decrease in vocational status, Decline in physical health and Financial problems/change in socioeconomic status  Historical Factors: Impulsivity  Risk Reduction Factors:   Positive social support and Positive therapeutic relationship  Continued Clinical Symptoms:  Resolving psychosis  Cognitive Features That Contribute To Risk:  Closed-mindedness Loss of executive function Polarized thinking Thought constriction (tunnel vision)    Suicide Risk:  Minimal: No identifiable suicidal ideation.  Patients presenting with no risk factors but with morbid ruminations; may be classified as minimal risk based on the severity of the depressive symptoms  Discharge Diagnoses:   AXIS I:  Schizoaffective disorder. PTSD by history AXIS II:  Mental retardation, severity unknown AXIS III:   Past Medical History  Diagnosis Date  . Diabetes mellitus without complication   . MR (mental retardation)    AXIS IV:  economic problems, housing problems, problems related to social environment and problems with access to health care services AXIS V:  61-70 mild symptoms  Plan Of Care/Follow-up recommendations:  Activity:  as tolerated Diet:  healthy Tests:  routine Other:  patient to keep hsi after care appointment  Is patient on multiple antipsychotic therapies at discharge:  No   Has Patient had three or more failed trials of antipsychotic monotherapy by history:  No  Recommended Plan for Multiple Antipsychotic Therapies: N/A  Viona Hosking,MD 02/14/2013, 9:09 AM

## 2013-02-14 NOTE — Clinical Social Work Note (Signed)
Spoke with Redkey from [facility] [phone] who explained that [facility] demonstrates hospital seeking behavior on a regular basis.  He states he is suicidal and homicidal because he likes eating hospital food and watching TV without restriction. She states they try to intercept him in the ED before he gets admitted, but the last 2 times they have been unable to intervene, and so he has been admitted.  She advises D/C in order to minimize the reinforcement of continuing hospitalization.

## 2013-02-15 ENCOUNTER — Emergency Department (HOSPITAL_COMMUNITY)
Admission: EM | Admit: 2013-02-15 | Discharge: 2013-02-15 | Disposition: A | Discharge status: Home / Self Care | Payer: Medicare Other | Attending: Emergency Medicine | Admitting: Emergency Medicine

## 2013-02-15 ENCOUNTER — Encounter (HOSPITAL_COMMUNITY): Payer: Self-pay

## 2013-02-15 ENCOUNTER — Ambulatory Visit (HOSPITAL_COMMUNITY)
Admission: RE | Admit: 2013-02-15 | Discharge: 2013-02-15 | Disposition: A | Discharge status: Home / Self Care | Payer: Medicare Other | Attending: Psychiatry | Admitting: Psychiatry

## 2013-02-15 DIAGNOSIS — R4689 Other symptoms and signs involving appearance and behavior: Secondary | ICD-10-CM

## 2013-02-15 DIAGNOSIS — F17200 Nicotine dependence, unspecified, uncomplicated: Secondary | ICD-10-CM | POA: Insufficient documentation

## 2013-02-15 DIAGNOSIS — Z765 Malingerer [conscious simulation]: Secondary | ICD-10-CM | POA: Insufficient documentation

## 2013-02-15 DIAGNOSIS — F319 Bipolar disorder, unspecified: Secondary | ICD-10-CM | POA: Insufficient documentation

## 2013-02-15 DIAGNOSIS — F39 Unspecified mood [affective] disorder: Secondary | ICD-10-CM | POA: Insufficient documentation

## 2013-02-15 DIAGNOSIS — F411 Generalized anxiety disorder: Secondary | ICD-10-CM | POA: Insufficient documentation

## 2013-02-15 DIAGNOSIS — F603 Borderline personality disorder: Secondary | ICD-10-CM | POA: Insufficient documentation

## 2013-02-15 DIAGNOSIS — Z880 Allergy status to penicillin: Secondary | ICD-10-CM | POA: Insufficient documentation

## 2013-02-15 DIAGNOSIS — Z79899 Other long term (current) drug therapy: Secondary | ICD-10-CM | POA: Insufficient documentation

## 2013-02-15 DIAGNOSIS — E119 Type 2 diabetes mellitus without complications: Secondary | ICD-10-CM | POA: Insufficient documentation

## 2013-02-15 LAB — CBC
MCH: 28.3 pg (ref 26.0–34.0)
Platelets: 393 10*3/uL (ref 150–400)
RBC: 4.45 MIL/uL (ref 4.22–5.81)
RDW: 16 % — ABNORMAL HIGH (ref 11.5–15.5)
WBC: 9.6 10*3/uL (ref 4.0–10.5)

## 2013-02-15 LAB — COMPREHENSIVE METABOLIC PANEL
ALT: 40 U/L (ref 0–53)
AST: 28 U/L (ref 0–37)
Albumin: 3.5 g/dL (ref 3.5–5.2)
Alkaline Phosphatase: 59 U/L (ref 39–117)
CO2: 24 mEq/L (ref 19–32)
Calcium: 9.6 mg/dL (ref 8.4–10.5)
Chloride: 100 mEq/L (ref 96–112)
GFR calc non Af Amer: >90 mL/min (ref >90)
Glucose, Bld: 126 mg/dL — ABNORMAL HIGH (ref 70–99)
Sodium: 136 mEq/L (ref 135–145)
Total Bilirubin: 0.3 mg/dL (ref 0.3–1.2)

## 2013-02-15 LAB — RAPID URINE DRUG SCREEN, HOSP PERFORMED
Amphetamines: NOT DETECTED
Barbiturates: NOT DETECTED
Benzodiazepines: NOT DETECTED
Cocaine: NOT DETECTED
Opiates: NOT DETECTED
Tetrahydrocannabinol: NOT DETECTED
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
## 2013-02-15 NOTE — ED Notes (Signed)
Pt continually sts that he is drunk and is concerned that ETOH will not show up on blood work.  Sts "if they let me go home, I'm going to hurt myself or someone else."

## 2013-02-15 NOTE — ED Notes (Signed)
Patient has one bag of belongings in locker 31. 

## 2013-02-15 NOTE — ED Notes (Signed)
Pt escorted to discharge window. Verbalized understanding discharge instructions. In no acute distress.   

## 2013-02-15 NOTE — ED Provider Notes (Addendum)
CSN: [id number]     Arrival date & time 02/15/13  1044 History   First MD Initiated Contact with Patient 02/15/13 1101     Chief complaint:  'drank a lot last night and this morning...Marland Kitchendepressed sometimes'   (Consider location/radiation/quality/duration/timing/severity/associated sxs/prior Treatment) The history is provided by the patient.  pt with hx ptsd, MR, bipolar disorder, presents from group home in [facility].  Pt notes this morning and last night he drank heavily. Also notes intermittent feelings depression. States was just admitting at [facility] for same. Denies any new/unusual stressors since d/c.  States went back to his normal group home. States at times he feel stressed out, but is unable to identify any particular stressor, person or issue.  Pt denies thoughts of harm to self, no plan to harm self or others. States is taking his normal meds. Is drinking and eating normally, good appetite. Sleeps at night without difficulty.      Past Medical History  Diagnosis Date  . Diabetes mellitus without complication   . Bipolar 1 disorder   . PTSD (post-traumatic stress disorder)   . MR (mental retardation)    Past Surgical History  Procedure Laterality Date  . Hand surgery     No family history on file. History  Substance Use Topics  . Smoking status: Current Every Day Smoker -- 1.00 packs/day for 10 years    Types: Cigarettes  . Smokeless tobacco: Not on file  . Alcohol Use: No    Review of Systems  Constitutional: Negative for fever.  HENT: Negative for neck pain.   Eyes: Negative for redness.  Respiratory: Negative for shortness of breath.   Cardiovascular: Negative for chest pain.  Gastrointestinal: Negative for abdominal pain.  Genitourinary: Negative for flank pain.  Musculoskeletal: Negative for back pain.  Skin: Negative for rash.  Neurological: Negative for headaches.  Hematological: Does not bruise/bleed easily.  Psychiatric/Behavioral: Positive for dysphoric  mood. Negative for suicidal ideas and self-injury. The patient is nervous/anxious.     Allergies  Amoxicillin and Penicillins  Home Medications   Current Outpatient Rx  Name  Route  Sig  Dispense  Refill  . benztropine (COGENTIN) 1 MG tablet   Oral   Take 1 tablet (1 mg total) by mouth at bedtime.   30 tablet   0   . carbamazepine (EQUETRO) 200 MG CP12 12 hr capsule   Oral   Take 1 capsule (200 mg total) by mouth 2 (two) times daily.   60 each   0   . carvedilol (COREG) 6.25 MG tablet   Oral   Take 1 tablet (6.25 mg total) by mouth 2 (two) times daily with a meal.         . diphenhydrAMINE (BENADRYL) 25 MG tablet   Oral   Take 1 tablet (25 mg total) by mouth at bedtime.   30 tablet   0   . fenofibrate micronized (LOFIBRA) 134 MG capsule   Oral   Take 1 capsule (134 mg total) by mouth daily before breakfast.         . FLUoxetine (PROZAC) 20 MG capsule   Oral   Take 1 capsule (20 mg total) by mouth daily.   30 capsule   0   . fluPHENAZine (PROLIXIN) 5 MG tablet   Oral   Take 1 tablet (5 mg total) by mouth at bedtime.   30 tablet   0   . metFORMIN (GLUCOPHAGE) 500 MG tablet   Oral   Take  2 tablets (1,000 mg total) by mouth 2 (two) times daily with a meal.         . pantoprazole (PROTONIX) 20 MG tablet   Oral   Take 1 tablet (20 mg total) by mouth 2 (two) times daily.         . simvastatin (ZOCOR) 20 MG tablet   Oral   Take 1 tablet (20 mg total) by mouth every evening.   30 tablet       BP 118/88  Pulse 94  Temp(Src) 98.8 F (37.1 C) (Oral)  Resp 16  SpO2 96% Physical Exam  Nursing note and vitals reviewed. Constitutional: He is oriented to person, place, and time. He appears well-developed and well-nourished. No distress.  HENT:  Head: Atraumatic.  Mouth/Throat: Oropharynx is clear and moist.  Eyes: Pupils are equal, round, and reactive to light.  Neck: Neck supple. No tracheal deviation present.  Cardiovascular: Normal rate, regular  rhythm, normal heart sounds and intact distal pulses.   Pulmonary/Chest: Effort normal and breath sounds normal. No accessory muscle usage. No respiratory distress.  Abdominal: Soft. Bowel sounds are normal. He exhibits no distension. There is no tenderness.  Musculoskeletal: Normal range of motion. He exhibits no edema and no tenderness.  Neurological: He is alert and oriented to person, place, and time.  Skin: Skin is warm and dry.  Psychiatric: He has a normal mood and affect.  Smiling, alert, very conversant. Normal mood and affect.     ED Course  Procedures (including critical care time)      Results for orders placed during the hospital encounter of 02/15/13  CBC      Result Value Range   WBC 9.6  4.0 - 10.5 K/uL   RBC 4.45  4.22 - 5.81 MIL/uL   Hemoglobin 12.6 (*) 13.0 - 17.0 g/dL   HCT 40.9 (*) 81.1 - 91.4 %   MCV 86.7  78.0 - 100.0 fL   MCH 28.3  26.0 - 34.0 pg   MCHC 32.6  30.0 - 36.0 g/dL   RDW 78.2 (*) 95.6 - 21.3 %   Platelets 393  150 - 400 K/uL  COMPREHENSIVE METABOLIC PANEL      Result Value Range   Sodium 136  135 - 145 mEq/L   Potassium 4.1  3.5 - 5.1 mEq/L   Chloride 100  96 - 112 mEq/L   CO2 24  19 - 32 mEq/L   Glucose, Bld 126 (*) 70 - 99 mg/dL   BUN 15  6 - 23 mg/dL   Creatinine, Ser 0.86  0.50 - 1.35 mg/dL   Calcium 9.6  8.4 - 57.8 mg/dL   Total Protein 7.2  6.0 - 8.3 g/dL   Albumin 3.5  3.5 - 5.2 g/dL   AST 28  0 - 37 U/L   ALT 40  0 - 53 U/L   Alkaline Phosphatase 59  39 - 117 U/L   Total Bilirubin 0.3  0.3 - 1.2 mg/dL   GFR calc non Af Amer >90  >90 mL/min   GFR calc Af Amer >90  >90 mL/min  ETHANOL      Result Value Range   Alcohol, Ethyl (B) <11  0 - 11 mg/dL      MDM  Labs.  Reviewed nursing notes and prior charts for additional history.   Reviewed recent visit, and psych team/SW notes (attached immediately below):     Patient Information    Patient Name Sex DOB SSN  Evertt, Chouinard Male Jun 29, 1990 [id number]             Clinical Social Work Note signed by Ida Rogue, LCSW at 02/14/2013 10:10 AM    Author: Ida Rogue, LCSW Service: (none) Author Type: Social Worker   Filed: 02/14/2013 10:10 AM Note Time: 02/14/2013 10:06 AM         Spoke with Tresa Endo from [facility] [phone] who explained that [facility] demonstrates hospital seeking behavior on a regular basis. He states he is suicidal and homicidal because he likes eating hospital food and watching TV without restriction. She states they try to intercept him in the ED before he gets admitted, but the last 2 times they have been unable to intervene, and so he has been admitted. She advises D/C in order to minimize the reinforcement of continuing hospitalization.    Recent similar reports reviewed from [facility] available in care everywhere.    Pt reports heavy etoh use this am, although etoh 0.  Pt is not acting intoxicated.  Is ambulatory w steady gait. On pt recheck x 2, pt continues to appear cheerful, smiling, conversant, content. Normal mood and affect. No findings of acute psychosis or acute severe depression.   Pt states has doctor/mental health follow up in [place].  Is encouraged to follow up with his doctor/therapist Monday.  Pt appears stable for d/c.       Suzi Roots, MD 02/15/13 1228

## 2013-02-15 NOTE — ED Notes (Addendum)
Spoke with Phebe Colla, at Group Home.  Sts Pt has availability and they will come and get him.  Her number is [phone].
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
## 2013-02-15 NOTE — Discharge Instructions (Signed)
Return to your group home.  Follow up with primary care doctor/behavioral health in the next couple days - call Monday morning to arrange follow up. Return to your local ER if worse, new symptoms, other concern.     Manic Depression (Bipolar Disorder) Bipolar disorder is also known as manic depressive illness. It is when the brain does not function properly and causes shifts in a person's moods, energy and ability to function in everyday life. These shifts are different from the normal ups and downs that everyone experiences. Instead the shifts are severe. If this goes untreated, the person's life becomes more and more disorderly. People with this disorder can be treated can lead full and productive lives. This disorder must be managed throughout life.  SYMPTOMS   Bipolar disorder causes dramatic mood swings. These mood swings go in cycles. They cycle from extreme "highs" and irritable to deep "lows" of sadness and hopelessness.  Between the extreme moods, there are usually periods of normal mood.  Along with the mood shifts, the person will have severe changes in energy and behavior. The periods of "highs" and "lows" are called episodes of mania and depression. Signs of mania:  Lots of energy, activity and restlessness.  Extreme "high" or good mood.  Extreme irritability.  Racing thoughts and talking very fast.  Jumping from one idea to another.  Not able to focus, easily distracted.  Little need to sleep.  Grand beliefs in one's abilities and powers.  Spending sprees.  Increased sexual drive. This can result in many sexual partners.  Poor judgment.  Abuse of drugs, particularly cocaine, alcohol, and sleeping medication.  Aggressive or provocative behavior.  A lasting period of behavior that is different from usual.  Denial that anything is wrong. *A manic episode is identified if a "high" mood happens with three or more of the other symptoms lasting most of the day,  nearly everyday for a week or longer. If the mood is more irritable in nature, four additional symptoms must be present. Signs of depression:  Lasting feelings of sadness, anxiety, or empty mood.  Feelings of hopelessness with negative thoughts.  Feelings of guilt, worthlessness, or helplessness.  Loss of interest or pleasure in activities once enjoyed, including sex.  Feelings of fatigue or having less energy.  Trouble focusing, making decisions, remembering.  Feeling restless or irritable.  Sleeping too little or too much.  Change in eating with possible weight gain or loss.  Feeling ongoing pain that is not caused by physical illness or injury.  Thoughts of death or suicide or suicide attempts. *A depressive episode is identified as having five or more of the above symptoms that last most of the day, nearly everyday for two weeks or longer. CAUSES   Research shows that there is no single cause for the disorder. Many factors act together to produce the illness.  This can be passed down from family (hereditary).  Environment may play a part. TREATMENT   Long-term treatment is strongly recommended because bipolar disorder is a repeated illness. This disorder is better controlled if treatment is ongoing than if it is off and on.  A combination of medication and talk therapy is best for managing the disorder over time.  Medication.  Medication can be prescribed by a doctor that is an expert in treating mental disorders (psychiatrists). Medications known as "mood stabilizers" are usually prescribed to help control the illness. Other medications can be added when needed. These medicines usually treat episodes of mania or  depression that break through despite the mood stabilizer.  Talk Therapy.  Along with medication, some forms of talk therapy are helpful in providing support, education and guidance to people with the illness and their families. Studies show that this type of  treatment increases mood stability, decreases need for hospitalization and improves how they function society.  Electroconvulsive Therapy (ECT).  In extreme situations where the above treatments do not work or work too slowly to relieve severe symptoms, ECT may be considered. Document Released: 08/14/2000 Document Revised: 07/31/2011 Document Reviewed: 04/05/2007 [facility] Patient Information 2014 [place], [place].     Borderline Personality Disorder Borderline personality disorder is a mental health disorder. People with borderline personality disorder have unhealthy patterns of perceiving, thinking about, and reacting to their environment and events in their life. These patterns are established by adolescence or early adulthood. People with borderline personality disorder also have difficulty coping with stress on their own and fear being abandoned by others. They have difficulty controlling their emotions. Their emotions change quickly, frequently, and intensely. They are easily upset and can become very angry, very suddenly. Their unpredictable behavior often leads to problems in their relationships. They often feel worthless, unloved, and emotionally empty. CAUSES No one knows the exact cause of borderline personality disorder. Most mental health experts think that there is more than one cause. Possible contributing factors include:  Genetic factors. These are traits that are passed down from one generation to the next. Many people with borderline personality disorder have a family history of the disorder.  Physical factors. The part of the brain that controls emotion may be different in people who have borderline personality disorder.  Social factors. Traumatic experiences involving other people may play a role in the development of borderline personality disorder. Examples include neglect, abandonment, and physical and sexual abuse. SYMPTOMS Signs and symptoms of borderline personality  disorder include:  A series of unstable personal relationships.  A strong fear of being abandoned and frantic efforts to avoid abandonment.  Impulsive, self-destructive behavior, such as substance abuse, irrational spending of money, unprotected sex with multiple partners, reckless driving, and binge eating.  Poor self-image that also may change a lot, or a sense of identity that is inconsistent.  Recurring self-injury or attempted suicide.  Severe mood swings, including depression, irritability, and anxiety.  Lasting feelings of emptiness.  Difficulty controlling anger.  Temporary feelings of paranoia or loss of touch with reality. DIAGNOSIS A diagnosis of borderline personality disorder requires the presence of at least 5 of the common signs and symptoms. This information is gathered from family and friends as well as medical professionals and legal professionals who have a close association with the patient. This information is also gathered during a psychiatric assessment. During the assessment, the patient is asked about early life experiences, level of education, employment status, physical health conditions, and current prescription and over-the-counter medicines used. TREATMENT Caregivers who usually treat borderline personality disorder are mental health professionals, such as psychologists, psychiatrists, and clinical social workers. More than one type of treatment may be needed. Types of treatment include:  Psychotherapy (also known as talk therapy or counseling).  Cognitive behavioral therapy. This helps the person to recognize and change unhealthy feelings, thoughts, and behaviors. They find new, more positive thoughts and actions to replace the old ones.  Dialectical behavioral therapy. Through this type of treatment, a person learns to understand his or her feelings and to regulate them. This may be one-on-one treatment or part of group therapy.  Family therapy. This  treatment includes family members.  Medicine. Medicine may be used to help control emotions, reduce reckless and self-destructive behavior, treat anxiety, and treat depression. SEEK IMMEDIATE MEDICAL CARE IF:   You cannot control your behavior or emotions.  You think about hurting yourself.  You think about suicide. FOR MORE INFORMATION National Alliance on Mental Illness: www.nami.org U.S. [employer] of Mental Health: http://www.maynard.net/ Borderline Personality Resource Center: http://bpdresourcecenter.org Document Released: 08/23/2010 Document Revised: 07/31/2011 Document Reviewed: 08/23/2010 [facility] Patient Information 2014 [place], [place].   RESOURCE GUIDE  Chronic Pain Problems: Contact [facility]  [phone] Patients need to be referred by their primary care doctor.  Insufficient Money for Medicine: Contact United Way:  call [phone]  No Primary Care Doctor: - Call Health Connect  [phone] - can help you locate a primary care doctor that  accepts your insurance, provides certain services, etc. - Physician Referral Service- [phone]  Agencies that provide inexpensive medical care: - [facility]  [phone] - [facility]  [phone] - [facility]  [phone] - Women's Clinic  [phone] - Planned Parenthood  [phone] [facility]  [phone]  Medicaid-accepting [facility] Providers: - [facility]- [address]  [phone], Mon-Fri 9am-7pm, Sat 9am-1pm - [facility]- [address] [place], Suite [place]  [phone] - [facility]- [address], Suite [place]  [phone] [facility]- [address]  [phone] - Renaye Rakers- [address] [place], [address], [phone]  Only accepts [place] patients after they have their name  applied to their card  Self Pay (no insurance) in [place]: - Sickle Cell Patients - [facility]  [address] [place], [phone] - [facility] Care- [address] [place]  [phone]       [facility] [place]- [address]       -     [facility]- see information above (Speak to [employer] if you do not have insurance)       -  [facility]- [address],  [phone]       -  [facility]- [address] [phone]       -  Dr Julio Sicks-  [address], [place], [phone]       -  [facility] - [address], [phone]       -  [facility]- [address], [phone], also [address], [phone]       -     [facility]- [address] [place], [phone], 1st & 3rd Saturday         every month, 10am-1pm  -     [facility]   [address] [place], [place].   Phone:  [phone], Fax:  [phone]. Hours of Operation:  9 am - 6 pm, M-F.  -     [facility]   [address], [place]   Phone: [phone], Fax: [phone]. Hours of Operation:  8:30 am - 5:30 pm, M-F.  [facility] [address] [place], [place] [address] [phone]  [facility] [address] [place], [place] [address] [phone]  1) Find a Doctor and Pay Out of Pocket Although you won't have to find out who is covered by your insurance plan, it is a good idea to ask around and get recommendations. You will then need to call the office and see if the doctor you have chosen will accept you as a new patient and what types of options they offer for patients who are self-pay. Some doctors offer discounts or will set up payment plans for their patients who do not have insurance, but you will need to ask so you aren't surprised when you get to your appointment.  2) Contact Your Local Health Department Not all health departments  have doctors that can see patients for sick visits, but many do, so it is worth a call to see if yours does. If you don't know where your local health department is, you can check in your phone book. The CDC also has a tool to help you locate your state's health department, and many state websites also have listings of all of their local health departments.  3) Find a Walk-in Clinic If your illness is not likely to be very severe or complicated, you may want to try a walk in clinic. These are popping up all over the country in pharmacies, drugstores, and shopping centers. They're usually staffed by nurse practitioners or physician assistants that have been trained to treat common illnesses and complaints. They're usually fairly quick and inexpensive. However, if you have serious medical issues or chronic medical problems, these are probably not your best option  STD Testing - [facility] Department of [facility] [place], STD Clinic, [address] [place], phone [phone] or [phone].  Monday - Friday, call for an appointment. [facility] of [employer], STD Clinic, [address], [place], phone [phone] or [phone].  Monday - Friday, call for an appointment.  Abuse/Neglect: [facility] Child Abuse Hotline 2072757345 [facility] Child Abuse Hotline [phone] (After Hours)  Emergency Shelter:  [facility] [phone]  Maternity Homes: - [facility] at [facility] [phone] - [facility] [phone]  MRSA Hotline #:   854-028-3959  Dental Assistance If unable to pay or uninsured, contact:  [facility]. to become qualified for the adult dental clinic.  Patients with Medicaid: [facility] [phone] [address], [phone] [address] [phone]  If unable to pay, or uninsured, contact [facility]  [phone] in [place], [phone] in [facility]) to become qualified for the adult dental clinic  [facility] [address] [place], [place] [address] [phone] [email or web address]  Other Proofreader Services: - [facility]- [address] [place], [place], [place], [address], [phone], Ext. [phone], 2nd and 4th Thursday of the month at 6:30am.  10 clients each day by appointment, can sometimes see walk-in patients if someone does not show for an appointment. [facility]- [address] [place], [place], [address], [phone] - [facility] [address], [place], [place], [address], [phone] - [facility]- [phone] [facility]- [phone] [facility] Health [phone]       Behavioral Health Resources in the [facility]  Intensive Outpatient Programs: [facility]      [address] [place], [place] [phone] Both a day and evening program  [facility] Outpatient     [address]        [place], [place] [address] [phone]         ADS: [facility] [address] [place] [phone]  [facility] Mental Health ACCESS LINE: [phone] or [phone] [address] [place], [place] [address] [email or web address]   Substance Abuse Resources: - [facility] Drug Services  ([phone] - [facility] [phone] - [facility] [place] [phone] Floydene Flock [phone] - Residential & Outpatient Substance Abuse Program  5205124109  Psychological Services: [facility]  [phone] [facility] Services  [phone] - [facility], [phone] [place]. [address], [place], ACCESS LINE: [phone] or [phone], [email or web address]  Mobile Crisis Teams:                                           Therapeutic Alternatives         Mobile Crisis Care Unit [phone]             [facility] [address] Ginette Otto [phone]                                         Interventionist [address] [place] [phone]  Self-Help/Support Groups: Mental Health Assoc. of [employer] of support groups 571-826-4168 (call for more info)  Narcotics Anonymous (NA) Caring [facility] [address] [place] - 2 meetings at this location  Residential Treatment Programs:  [facility]      [address]        [place]       [phone]         [facility] [address] [place] [address] [place], [place]  [address] [phone]  [facility]  [address] [place], [place] [address] [phone] Admissions: 8am-3pm M-F  [facility]     [address]        [place], [place] [address]       [phone]         [facility] [address] [place], [place] [phone]  [facility] [address] [place], [place] [phone]  [facility] - Intensive Outpatient      [address]     [place], [place]       [phone]         [facility] ([facility]     [address] [place], [place] [phone] or [phone]  Residential Treatment Services (RTS), Medicaid [address] [place], [place] [phone]  Fellowship [address]                                               [address] [place] [phone]  [facility] Resources: Estate manager/land agent [phone]               General Therapy  Angie Fava, PhD        [address] [place], [place] [address]         [phone]   Insurance  [facility] Behavioral   [address] [place], [place] [address] [phone]  [facility] Recovery [address] [place], [place] [address] [phone] Insurance/Medicaid/sponsorship through [facility] and Families                                              [address]                                        [place], [place] [address]    Therapy/tele-psych/case         [phone]          [facility] [address], [place]  [address]  Adolescent/group home/case management [phone]                                           Creola Corn PhD       General therapy       Insurance   [phone]         Dr. Lolly Mustache, Insurance, M-F [phone]  [facility]  United Way [facility] Dept. [address]                 [address]         [address] [place] [address]  Blondell Reveal Phone:  [phone]                                  Phone:  [phone]                   Phone:  [phone]  [facility] Mental Health, [phone] - [facility]- [phone]       -     [facility] in [place], [address]             [phone], Insurance  [place] Child Abuse Hotline 786-469-7372 or [phone] (After Hours)

## 2013-02-15 NOTE — ED Notes (Signed)
Pt given clothes to get dressed and a sandwich.

## 2013-02-17 NOTE — Discharge Summary (Signed)
Seen and agreed. Clarrissa Shimkus, MD 

## 2013-02-18 NOTE — Progress Notes (Signed)
Patient Discharge Instructions:  After Visit Summary (AVS):   Faxed to:  02/18/13 Discharge Summary Note:   Faxed to:  02/18/13 Psychiatric Admission Assessment Note:   Faxed to:  02/18/13 Suicide Risk Assessment - Discharge Assessment:   Faxed to:  02/18/13 Faxed/Sent to the Next Level Care provider:  02/18/13 Faxed to [facility] @ [phone]  Jerelene Redden, 02/18/2013, 4:20 PM

## 2013-05-05 ENCOUNTER — Institutional Professional Consult (permissible substitution): Payer: Self-pay | Admitting: Neurology

## 2013-05-06 ENCOUNTER — Encounter: Payer: Self-pay | Admitting: Neurology

## 2013-05-06 ENCOUNTER — Ambulatory Visit (INDEPENDENT_AMBULATORY_CARE_PROVIDER_SITE_OTHER): Payer: Medicare Other | Admitting: Neurology

## 2013-05-06 ENCOUNTER — Encounter (INDEPENDENT_AMBULATORY_CARE_PROVIDER_SITE_OTHER): Payer: Self-pay

## 2013-05-06 VITALS — BP 105/71 | HR 72 | Resp 16 | Ht 69.0 in | Wt 271.0 lb

## 2013-05-06 DIAGNOSIS — R0609 Other forms of dyspnea: Secondary | ICD-10-CM

## 2013-05-06 DIAGNOSIS — R0683 Snoring: Secondary | ICD-10-CM

## 2013-05-06 DIAGNOSIS — E662 Morbid (severe) obesity with alveolar hypoventilation: Secondary | ICD-10-CM

## 2013-05-06 DIAGNOSIS — F79 Unspecified intellectual disabilities: Secondary | ICD-10-CM

## 2013-05-06 HISTORY — DX: Morbid (severe) obesity with alveolar hypoventilation: E66.2

## 2013-05-06 HISTORY — DX: Snoring: R06.83

## 2013-05-06 NOTE — Patient Instructions (Signed)
Obesity Obesity is having too much body fat and a body mass index (BMI) of 30 or more. BMI is a number based on your height and weight. The number is an estimate of how much body fat you have. Obesity can happen if you eat more calories than you can burn by exercising or other activity. It can cause major health problems or emergencies.  HOME CARE  Exercise and be active as told by your doctor. Try:  Using stairs when you can.  Parking farther away from store doors.  Gardening, biking, or walking.  Eat healthy foods and drinks that are low in calories. Eat more fruits and vegetables.  Limit fast food, sweets, and snack foods that are made with ingredients that are not natural (processed food).  Eat smaller amounts of food.  Keep a journal and write down what you eat every day. Websites can help with this.  Avoid drinking alcohol. Drink more water and drinks without calories.   Take vitamins and dietary pills (supplements) only as told by your doctor.  Try going to weight-loss support groups or classes to help lessen stress. Dieticians and counselors may also help. GET HELP RIGHT AWAY IF:  You have chest pain or tightness.  You have trouble breathing or feel short of breath.  You feel weak or have loss of feeling (numbness) in your legs.  You feel confused or have trouble talking.  You have sudden changes in your vision. MAKE SURE YOU:  Understand these instructions.  Will watch your condition.  Will get help right away if you are not doing well or get worse. Document Released: 07/31/2011 Document Reviewed: 07/31/2011 ExitCare Patient Information 2014 [employer].  

## 2013-05-06 NOTE — Progress Notes (Signed)
[facility] SLEEP MEDICINE CLINIC  Provider:  Melvyn Franco, M D  Referring Provider: No ref. provider found Primary Care Physician:  No primary provider on file.  Chief Complaint  Patient presents with  . New Evaluation    rm 11    HPI:  Joshua Franco is a 22 y.o. male  Is seen here as a referral from Dr. Mickeal Needy PA, [facility] for a sleep consulation.  Joshua Franco, a 22 year old Caucasian, right-handed gentleman with learning and intellectual disabilities,  presents today for a consultation upon request of Joshua primary care office. The patient resides in a group home.   The patient has a history of waking from sleep gasping for air, he has been witnessed to snore loudly, he also has an irregular sleep breathing patterns pattern. This referral also mentioned unwanted behaviors during sleep. It appears that the patient has been a sleep walker and sleep talker and is well aware of this. He has been noted to fight and yell in Joshua sleep, with thrashing movements, defensive movements.  He reports vivid dreams, some threatening , many seem to be initiated by watching a movie. He falls sometimes asleep in the living room ,sitting on a sofa while watching TV, and Joshua room mates complaint about Joshua loudest snoring, Joshua Franco sees him on week ends and reports witnessing apnea.   The patient reports that Joshua bedtime is around 8:30 to 9 PM and he will follow sleep promptly without any medication taken specifically to initiate sleep. He may have up to 4 bathroom breaks at night, he rises in the morning at about 7 AM. He is woken up by a employee of the group home, sometimes he wakes up spontaneously.  He does not tonsillectomy coffee or caffeinated beverages he reports as breakfast at Joshua residence, he attends a day program, he will have a regular lunch around 11:30 to 12 norm. After lunch she will get tired and sleepy again. In the morning when waking up from Joshua  nocturnal sleep, he reports a dry mouth but does not report any headaches. He feels reasonably refreshed in the morning. He does deny taking any naps, but he still showed you have once. He does often fall asleep unplanned after lunch, he  will  miss a whole show on TV ,probably sleeping longer than 45 minutes. The patient will have a late natural daylight exposure during Joshua stay and he is also having regular or door activities he participates in.   Joshua Franco reports that at age 1 he was like a skinny and swelled high school he played sports which also Joshua weight at day. After Joshua 26th birthday he started to gain he believes that Joshua weight at the end of high school was about to under 210 pounds. Joshua current weight is over 270 pounds.  The patient  reports that he drinks sometimes energy drinks , not coffee- he  does not take caffeine per se.  He is an active smoker, but he has not used stimulating medications or substances. He used to binge on alcohol and Friday and Saturdays, before he was treated for Bipolar disorder.  Reviewing Joshua last physical exam and laboratory results it seems that the patient is borderline diabetic he is on metformin but Joshua HbA1c was 6.2 which is considered well controlled. Structures for this gentleman are Joshua body mass index is rather short large neck, he also has not to worry her which can be a symptom of  all as a as well as of diabetes. Joshua weight gain may have been precipitated by psychotropic medications. He is also on pantoprazole, simvastatin, fluphenazine, fluoxetine, metformin as mentioned above, Benadryl at night, benztropine 6.25 mg, carbamazepine twice a day 200 mg,.         Review of Systems: Out of a complete 14 system review, the patient complains of only the following symptoms, and all other reviewed systems are negative. ataxia , obesity, snoring,  Slurring words, dry mouth, nocturia,  EDS. Patient endorsed fatigue severity scale at 59 points and  to Epworth sleepiness score at 18 points - very high.  History   Social History  . Marital Status: Single    Spouse Name: N/A    Number of Children: 0  . Years of Education: hs   Occupational History  . Unemployed    Social History Main Topics  . Smoking status: Current Every Day Smoker -- 1.00 packs/day for 10 years    Types: Cigarettes  . Smokeless tobacco: Never Used  . Alcohol Use: No     Comment: Caffeine intake is heavy  . 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 Sexual Activity: Not on file   Other Topics Concern  . Not on file   Social History Narrative  . No narrative on file    Family History  Problem Relation Age of Onset  . Diabetes Father   . Diabetes Maternal Grandmother     Past Medical History  Diagnosis Date  . Diabetes mellitus without complication   . Bipolar 1 disorder   . PTSD (post-traumatic stress disorder)   . MR (mental retardation)   . Obesity   . GERD (gastroesophageal reflux disease)   . Hyperlipidemia   . Hyperactive   . HTN (hypertension)   . Anxiety disorder     Past Surgical History  Procedure Laterality Date  . Hand surgery Left     2008    Current Outpatient Prescriptions  Medication Sig Dispense Refill  . benztropine (COGENTIN) 1 MG tablet Take 1 tablet (1 mg total) by mouth at bedtime.  30 tablet  0  . carbamazepine (EQUETRO) 200 MG CP12 12 hr capsule Take 1 capsule (200 mg total) by mouth 2 (two) times daily.  60 each  0  . carvedilol (COREG) 6.25 MG tablet Take 1 tablet (6.25 mg total) by mouth 2 (two) times daily with a meal.      . diphenhydrAMINE (BENADRYL) 25 MG tablet Take 1 tablet (25 mg total) by mouth at bedtime.  30 tablet  0  . fenofibrate micronized (LOFIBRA) 134 MG capsule Take 1 capsule (134 mg total) by mouth daily before breakfast.      . FLUoxetine (PROZAC) 20 MG capsule Take 1 capsule (20 mg total) by mouth daily.  30 capsule  0  . fluPHENAZine (PROLIXIN) 5 MG  tablet Take 1 tablet (5 mg total) by mouth at bedtime.  30 tablet  0  . metFORMIN (GLUCOPHAGE) 500 MG tablet Take 2 tablets (1,000 mg total) by mouth 2 (two) times daily with a meal.      . omeprazole (PRILOSEC) 20 MG capsule Take 20 mg by mouth daily.      . pantoprazole (PROTONIX) 20 MG tablet Take 1 tablet (20 mg total) by mouth 2 (two) times daily.      . simvastatin (ZOCOR) 20 MG tablet Take 1 tablet (20 mg total) by mouth every evening.  30 tablet     No current facility-administered medications for this visit.    Allergies as of 05/06/2013 - Review Complete 05/06/2013  Allergen Reaction Noted  . Amoxicillin Other (See Comments) 04/03/2011  . Penicillins Other (See Comments) 04/03/2011    Vitals: BP 105/71  Pulse 72  Resp 16  Ht 5\' 9"  (1.753 m)  Wt 271 lb (122.925 kg)  BMI 40.00 kg/m2 Last Weight:  Wt Readings from Last 1 Encounters:  05/06/13 271 lb (122.925 kg)   Last Height:   Ht Readings from Last 1 Encounters:  05/06/13 5\' 9"  (1.753 m)    Physical exam:  General: The patient is awake, alert and appears not in acute distress. The patient is well groomed. Head: Normocephalic, atraumatic. Neck is supple. Mallampati 4 , neck circumference: 19 inches, moderate  severe  retrognathia, low set ears , fleeing Forehead.  He keeps Joshua mouth open.  Cardiovascular:  Regular rate and rhythm, without  murmurs or carotid bruit, and without distended neck veins. Respiratory: Lungs are clear to auscultation. Skin:  Without evidence of edema, or rash Trunk: BMI is elevated ,patient  has normal posture.  Neurologic exam : The patient is awake and alert, oriented to place and time.  Memory subjective  described as intact. There is a normal attention span & concentration ability.  Speech is mildly dysarthric, not dysphonic or aphasic. Mood and affect are child like , vocabulary os limited.   Cranial nerves: Pupils are equal and briskly reactive to light. Funduscopic exam without   evidence of pallor or edema.  Extraocular movements  in vertical and horizontal planes intact and without nystagmus. Visual fields by finger perimetry are intact. Hearing to finger rub intact.  Facial sensation intact to fine touch. Facial motor strength is symmetric and tongue and uvula move midline. Uvula moves above tongue ground, the left side being lower.   Motor exam:   Normal  tone and muscle bulk and symmetric strength in all extremities.  Sensory:  Fine touch, pinprick and vibration were tested in all extremities. Proprioception is tested in the upper extremities only. This was normal.  Coordination: Rapid alternating movements in the fingers/hands is tested and normal. Finger-to-nose maneuver tested without evidence of ataxia, dysmetria .Only a mild fineTremor is noted over the outstretched fingers. .  Gait and station: Patient walks without assistive device, will unassisted climb up to the exam table. Strength within normal limits. Stance is wide based, stable - Tandem gait is ataxia fragmented. Romberg testing is normal. reports often feeling as if he falls backwards.    Deep tendon reflexes: in the upper and lower extremities are symmetric and intact. Babinski maneuver response is  downgoing.   Assessment:  After physical and neurologic examination, review of laboratory studies, imaging, neurophysiology testing and pre-existing records, assessment is   1) intellectual , mild- learning disabilities. Bipolar disorder, ETOH use history . 2) excessive daytime sleepiness, morbid obesity and nocturia, snoring and witnessed apnea.     Plan:  Treatment plan and additional workup : SPLIT study with CO2 retainement evaluation . Weight loss , nutrition, advise of low carb and low sugar diet.  Hydrate with water , not soda.  Regular exercise.   Information about the risk factors of obstructive sleep apnea such as body mass index height Mallampati, rectal mainly are p. I also advised of the  apnea being a risk factor for cardiac disease, atrial fibrillation and other arrhythmias, and increasing the patient's risk of stroke , booklet provided.  60 minutes visit with caretaker and patient.

## 2013-07-30 ENCOUNTER — Ambulatory Visit (INDEPENDENT_AMBULATORY_CARE_PROVIDER_SITE_OTHER): Payer: Self-pay | Admitting: Neurology

## 2013-07-30 DIAGNOSIS — Z0289 Encounter for other administrative examinations: Secondary | ICD-10-CM

## 2013-07-30 DIAGNOSIS — G4733 Obstructive sleep apnea (adult) (pediatric): Secondary | ICD-10-CM

## 2013-08-12 ENCOUNTER — Telehealth: Payer: Self-pay | Admitting: Neurology

## 2013-08-12 DIAGNOSIS — G4733 Obstructive sleep apnea (adult) (pediatric): Secondary | ICD-10-CM

## 2013-08-12 NOTE — Telephone Encounter (Signed)
I called and left a message for [name] concerning Joshua Franco's sleep study results. I informed [name] that the study confirmed the diagnosis of obstructive sleep apnea and that [name] did well on CPAP during the night of his study. Dr. Vickey Hugerohmeier recommend CPAP therapy at home. I will fax a copy of the report to Christ Kickachel Courtemanche, [facility] and mail a copy to the patient along with a follow up instruction letter.

## 2013-08-13 ENCOUNTER — Encounter: Payer: Self-pay | Admitting: *Deleted

## 2013-08-13 ENCOUNTER — Other Ambulatory Visit: Payer: Self-pay | Admitting: *Deleted

## 2013-08-13 DIAGNOSIS — F79 Unspecified intellectual disabilities: Secondary | ICD-10-CM

## 2013-08-13 DIAGNOSIS — R0683 Snoring: Secondary | ICD-10-CM

## 2013-08-19 NOTE — Sleep Study (Signed)
See media tab for full report  

## 2013-08-22 ENCOUNTER — Encounter: Payer: Self-pay | Admitting: Neurology

## 2013-08-29 ENCOUNTER — Encounter (HOSPITAL_COMMUNITY): Payer: Self-pay | Admitting: Emergency Medicine

## 2013-08-29 ENCOUNTER — Emergency Department (EMERGENCY_DEPARTMENT_HOSPITAL)
Admission: EM | Admit: 2013-08-29 | Discharge: 2013-08-30 | Disposition: A | Discharge status: Other Acute Inpatient Hospital | Payer: Medicare Other | Source: Home / Self Care | Attending: Emergency Medicine | Admitting: Emergency Medicine

## 2013-08-29 DIAGNOSIS — F314 Bipolar disorder, current episode depressed, severe, without psychotic features: Secondary | ICD-10-CM | POA: Diagnosis present

## 2013-08-29 DIAGNOSIS — F4311 Post-traumatic stress disorder, acute: Secondary | ICD-10-CM

## 2013-08-29 DIAGNOSIS — F3130 Bipolar disorder, current episode depressed, mild or moderate severity, unspecified: Secondary | ICD-10-CM | POA: Diagnosis present

## 2013-08-29 NOTE — ED Provider Notes (Signed)
CSN: [id number]     Arrival date & time 08/29/13  2314 History  This chart was scribed for non-physician practitioner, Sabino DickGail Shulz, NP-C working with Lyanne CoKevin M Campos, MD by Luisa DagoPriscilla Tutu, ED scribe. This patient was seen in room WTR6/WTR6 and the patient's care was started at 11:40 PM.     Chief Complaint  Patient presents with  . Medical Clearance   The history is provided by the patient. No language interpreter was used.   HPI Comments: Joshua Franco is a 23 y.o. male who presents to the Emergency Department complaining of worsening depression that started yesterday. Pt states that today at work everyone was yelling at him telling him what to do so he go frustrated. He also states that he is having relationship issues with his current girlfriend. Pt states that he has cut himself in the past, today was threatening to drink gasoline and hit him self in the head with a candle stick  He denies any HI. Denies any fever, chills, diaphoresis, nausea, emesis, cough, congestion, chest pain, or SOB.   Past Medical History  Diagnosis Date  . Diabetes mellitus without complication   . Bipolar 1 disorder   . PTSD (post-traumatic stress disorder)   . MR (mental retardation)   . Obesity   . GERD (gastroesophageal reflux disease)   . Hyperlipidemia   . Hyperactive   . HTN (hypertension)   . Anxiety disorder   . Obesity hypoventilation syndrome 05/06/2013  . 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 Depression    Past Surgical History  Procedure Laterality Date  . Hand surgery Left     2008   Family History  Problem Relation Age of Onset  . Diabetes Father   . Diabetes Maternal Grandmother    History  Substance Use Topics  . Smoking status: Current Every Day Smoker -- 0.50 packs/day for 10 years    Types: Cigarettes  . Smokeless tobacco: Never Used  . Alcohol Use: No     Comment: Caffeine intake is heavy    Review of Systems  Constitutional: Negative for fever, chills and diaphoresis.  HENT:  Negative for congestion.   Respiratory: Negative for cough and shortness of breath.   Cardiovascular: Negative for chest pain.  Gastrointestinal: Negative for nausea, vomiting and abdominal pain.  Skin: Negative for rash.  Neurological: Negative for dizziness and headaches.  Psychiatric/Behavioral: Positive for suicidal ideas, behavioral problems and self-injury. The patient is nervous/anxious.   All other systems reviewed and are negative.     Allergies  Amoxicillin and Penicillins  Home Medications   No current outpatient prescriptions on file. BP 98/65  Pulse 76  Temp(Src) 97.5 F (36.4 C) (Oral)  Resp 16  SpO2 96%  Physical Exam  Nursing note and vitals reviewed. Constitutional: He appears well-developed and well-nourished. No distress.  HENT:  Head: Normocephalic and atraumatic.  Eyes: Conjunctivae are normal. Right eye exhibits no discharge. Left eye exhibits no discharge.  Neck: Neck supple.  Cardiovascular: Normal rate, regular rhythm and normal heart sounds.  Exam reveals no gallop and no friction rub.   No murmur heard. Pulmonary/Chest: Effort normal and breath sounds normal. No respiratory distress.  Abdominal: Soft. He exhibits no distension. There is no tenderness.  Musculoskeletal: He exhibits no edema and no tenderness.  Neurological: He is alert.  Skin: Skin is warm and dry.    ED Course  Procedures (including critical care time)  DIAGNOSTIC STUDIES: Oxygen Saturation was not taken by my interpretation.    COORDINATION  OF CARE: 11:46 PM- Pt advised of plan for treatment and pt agrees.  Labs Review  Labs Reviewed  CBC WITH DIFFERENTIAL - Abnormal; Notable for the following:    Hemoglobin 11.8 (*)    HCT 36.7 (*)    Platelets 457 (*)    Eosinophils Relative 6 (*)    All other components within normal limits  I-STAT CHEM 8, ED - Abnormal; Notable for the following:    Glucose, Bld 105 (*)    Calcium, Ion 1.27 (*)    All other components within  normal limits  CBG MONITORING, ED - Abnormal; Notable for the following:    Glucose-Capillary 100 (*)    All other components within normal limits  ETHANOL  URINE RAPID DRUG SCREEN (HOSP PERFORMED)  CBG MONITORING, ED   Imaging Review No results found.   EKG Interpretation None      MDM   Final diagnoses:  Bipolar 1 disorder, depressed, severe  Acute posttraumatic stress disorder      I personally performed the services described in this documentation, which was scribed in my presence. The recorded information has been reviewed and is accurate.    Arman Filter, NP 09/08/13 2149

## 2013-08-29 NOTE — ED Notes (Signed)
Pt BIB [facility]. Pt states he is under a lot of stress at work and with his girlfriend. Pt lives in a group home and told the staff at the home that he wanted to drink gasoline and hit himself in head with a candlestick. Pt has hx of cutting himself. Pt states he is calmer now and doesn't feel that way. States "I feel safe now.". Pt states he has been taking his meds. Pt is calm, cooperative.

## 2013-08-30 ENCOUNTER — Encounter (HOSPITAL_COMMUNITY): Payer: Self-pay | Admitting: *Deleted

## 2013-08-30 ENCOUNTER — Inpatient Hospital Stay (HOSPITAL_COMMUNITY)
Admission: AD | Admit: 2013-08-30 | Discharge: 2013-09-05 | DRG: 885 | Disposition: A | Discharge status: Home / Self Care | Payer: Medicare Other | Source: Intra-hospital | Attending: Psychiatry | Admitting: Psychiatry

## 2013-08-30 DIAGNOSIS — E785 Hyperlipidemia, unspecified: Secondary | ICD-10-CM | POA: Diagnosis present

## 2013-08-30 DIAGNOSIS — F411 Generalized anxiety disorder: Secondary | ICD-10-CM | POA: Diagnosis present

## 2013-08-30 DIAGNOSIS — G4700 Insomnia, unspecified: Secondary | ICD-10-CM | POA: Diagnosis present

## 2013-08-30 DIAGNOSIS — F4311 Post-traumatic stress disorder, acute: Secondary | ICD-10-CM

## 2013-08-30 DIAGNOSIS — K219 Gastro-esophageal reflux disease without esophagitis: Secondary | ICD-10-CM | POA: Diagnosis present

## 2013-08-30 DIAGNOSIS — E7800 Pure hypercholesterolemia, unspecified: Secondary | ICD-10-CM | POA: Diagnosis present

## 2013-08-30 DIAGNOSIS — Z833 Family history of diabetes mellitus: Secondary | ICD-10-CM

## 2013-08-30 DIAGNOSIS — F314 Bipolar disorder, current episode depressed, severe, without psychotic features: Principal | ICD-10-CM

## 2013-08-30 DIAGNOSIS — F4310 Post-traumatic stress disorder, unspecified: Secondary | ICD-10-CM | POA: Diagnosis present

## 2013-08-30 DIAGNOSIS — F79 Unspecified intellectual disabilities: Secondary | ICD-10-CM | POA: Diagnosis present

## 2013-08-30 DIAGNOSIS — R45851 Suicidal ideations: Secondary | ICD-10-CM

## 2013-08-30 DIAGNOSIS — F17200 Nicotine dependence, unspecified, uncomplicated: Secondary | ICD-10-CM | POA: Diagnosis present

## 2013-08-30 DIAGNOSIS — I10 Essential (primary) hypertension: Secondary | ICD-10-CM | POA: Diagnosis present

## 2013-08-30 DIAGNOSIS — F332 Major depressive disorder, recurrent severe without psychotic features: Secondary | ICD-10-CM

## 2013-08-30 DIAGNOSIS — E119 Type 2 diabetes mellitus without complications: Secondary | ICD-10-CM | POA: Diagnosis present

## 2013-08-30 HISTORY — DX: Major depressive disorder, single episode, unspecified: F32.9

## 2013-08-30 HISTORY — DX: Depression, unspecified: F32.A

## 2013-08-30 LAB — ETHANOL: Alcohol, Ethyl (B): <11 mg/dL (ref 0–11)

## 2013-08-30 LAB — CBC WITH DIFFERENTIAL/PLATELET
Basophils Absolute: 0 10*3/uL (ref 0.0–0.1)
Basophils Relative: 1 % (ref 0–1)
EOS PCT: 6 % — AB (ref 0–5)
Eosinophils Absolute: 0.4 10*3/uL (ref 0.0–0.7)
HEMATOCRIT: 36.7 % — AB (ref 39.0–52.0)
Hemoglobin: 11.8 g/dL — ABNORMAL LOW (ref 13.0–17.0)
LYMPHS ABS: 3.2 10*3/uL (ref 0.7–4.0)
LYMPHS PCT: 41 % (ref 12–46)
MCH: 27.4 pg (ref 26.0–34.0)
MCHC: 32.2 g/dL (ref 30.0–36.0)
MCV: 85.3 fL (ref 78.0–100.0)
MONO ABS: 0.7 10*3/uL (ref 0.1–1.0)
Monocytes Relative: 9 % (ref 3–12)
Neutro Abs: 3.5 10*3/uL (ref 1.7–7.7)
Neutrophils Relative %: 44 % (ref 43–77)
Platelets: 457 10*3/uL — ABNORMAL HIGH (ref 150–400)
RBC: 4.3 MIL/uL (ref 4.22–5.81)
RDW: 14.6 % (ref 11.5–15.5)
WBC: 7.9 10*3/uL (ref 4.0–10.5)

## 2013-08-30 LAB — RAPID URINE DRUG SCREEN, HOSP PERFORMED
AMPHETAMINES: NOT DETECTED
BENZODIAZEPINES: NOT DETECTED
Barbiturates: NOT DETECTED
Cocaine: NOT DETECTED
OPIATES: NOT DETECTED
Tetrahydrocannabinol: NOT DETECTED

## 2013-08-30 LAB — I-STAT CHEM 8, ED
BUN: 18 mg/dL (ref 6–23)
CALCIUM ION: 1.27 mmol/L — AB (ref 1.12–1.23)
CREATININE: 0.9 mg/dL (ref 0.50–1.35)
Chloride: 104 mEq/L (ref 96–112)
GLUCOSE: 105 mg/dL — AB (ref 70–99)
HCT: 40 % (ref 39.0–52.0)
Hemoglobin: 13.6 g/dL (ref 13.0–17.0)
Potassium: 4.3 mEq/L (ref 3.7–5.3)
Sodium: 141 mEq/L (ref 137–147)
TCO2: 24 mmol/L (ref 0–100)

## 2013-08-30 LAB — CBG MONITORING, ED
GLUCOSE-CAPILLARY: 85 mg/dL (ref 70–99)
Glucose-Capillary: 100 mg/dL — ABNORMAL HIGH (ref 70–99)

## 2013-08-30 LAB — GLUCOSE, CAPILLARY: Glucose-Capillary: 121 mg/dL — ABNORMAL HIGH (ref 70–99)

## 2013-08-30 MED ORDER — CARVEDILOL 6.25 MG PO TABS
6.2500 mg | ORAL_TABLET | Freq: Two times a day (BID) | ORAL | Status: DC
  Administered 2013-08-30 – 2013-09-04 (×11): 6.25 mg via ORAL
  Filled 2013-08-30 (×5): qty 1
  Filled 2013-08-30: qty 2
  Filled 2013-08-30 (×3): qty 1
  Filled 2013-08-30: qty 2
  Filled 2013-08-30 (×8): qty 1

## 2013-08-30 MED ORDER — SIMVASTATIN 20 MG PO TABS
20.0000 mg | ORAL_TABLET | Freq: Every day | ORAL | Status: DC
  Filled 2013-08-30: qty 1

## 2013-08-30 MED ORDER — FLUOXETINE HCL 20 MG PO CAPS
20.0000 mg | ORAL_CAPSULE | Freq: Every day | ORAL | Status: DC
  Administered 2013-08-30: 20 mg via ORAL
  Filled 2013-08-30: qty 1

## 2013-08-30 MED ORDER — METFORMIN HCL 500 MG PO TABS
1000.0000 mg | ORAL_TABLET | Freq: Two times a day (BID) | ORAL | Status: DC
  Administered 2013-08-30: 1000 mg via ORAL
  Filled 2013-08-30 (×3): qty 2

## 2013-08-30 MED ORDER — FLUPHENAZINE HCL 5 MG PO TABS
5.0000 mg | ORAL_TABLET | Freq: Every day | ORAL | Status: DC
  Administered 2013-08-30 – 2013-09-01 (×3): 5 mg via ORAL
  Filled 2013-08-30 (×5): qty 1

## 2013-08-30 MED ORDER — FENOFIBRATE 160 MG PO TABS
160.0000 mg | ORAL_TABLET | Freq: Every day | ORAL | Status: DC
  Administered 2013-08-31 – 2013-09-05 (×6): 160 mg via ORAL
  Filled 2013-08-30 (×8): qty 1

## 2013-08-30 MED ORDER — CARBAMAZEPINE ER 200 MG PO CP12
200.0000 mg | ORAL_CAPSULE | Freq: Two times a day (BID) | ORAL | Status: DC
  Administered 2013-08-30: 200 mg via ORAL
  Filled 2013-08-30 (×2): qty 1

## 2013-08-30 MED ORDER — SIMVASTATIN 20 MG PO TABS
20.0000 mg | ORAL_TABLET | Freq: Every day | ORAL | Status: DC
  Administered 2013-08-30 – 2013-09-04 (×6): 20 mg via ORAL
  Filled 2013-08-30 (×9): qty 1

## 2013-08-30 MED ORDER — MAGNESIUM HYDROXIDE 400 MG/5ML PO SUSP: 30.0000 mL | Freq: Every day | ORAL | Status: DC | PRN

## 2013-08-30 MED ORDER — CARBAMAZEPINE ER 200 MG PO CP12
200.0000 mg | ORAL_CAPSULE | Freq: Two times a day (BID) | ORAL | Status: DC
  Administered 2013-08-30 – 2013-09-05 (×12): 200 mg via ORAL
  Filled 2013-08-30 (×18): qty 1

## 2013-08-30 MED ORDER — FLUPHENAZINE HCL 5 MG PO TABS
5.0000 mg | ORAL_TABLET | Freq: Every day | ORAL | Status: DC
  Filled 2013-08-30: qty 1

## 2013-08-30 MED ORDER — CARVEDILOL 6.25 MG PO TABS
6.2500 mg | ORAL_TABLET | Freq: Two times a day (BID) | ORAL | Status: DC
  Administered 2013-08-30: 6.25 mg via ORAL
  Filled 2013-08-30 (×3): qty 1

## 2013-08-30 MED ORDER — ACETAMINOPHEN 325 MG PO TABS
650.0000 mg | ORAL_TABLET | Freq: Four times a day (QID) | ORAL | Status: DC | PRN
  Administered 2013-09-04: 650 mg via ORAL
  Filled 2013-08-30: qty 2

## 2013-08-30 MED ORDER — BENZTROPINE MESYLATE 1 MG PO TABS: 1.0000 mg | ORAL_TABLET | Freq: Every day | ORAL | Status: DC

## 2013-08-30 MED ORDER — METFORMIN HCL 500 MG PO TABS
1000.0000 mg | ORAL_TABLET | Freq: Two times a day (BID) | ORAL | Status: DC
  Administered 2013-08-30 – 2013-09-05 (×12): 1000 mg via ORAL
  Filled 2013-08-30 (×17): qty 2

## 2013-08-30 MED ORDER — FENOFIBRATE 160 MG PO TABS
160.0000 mg | ORAL_TABLET | Freq: Every day | ORAL | Status: DC
  Administered 2013-08-30: 160 mg via ORAL
  Filled 2013-08-30: qty 1

## 2013-08-30 MED ORDER — DIPHENHYDRAMINE HCL 25 MG PO TABS
25.0000 mg | ORAL_TABLET | Freq: Every day | ORAL | Status: DC
  Filled 2013-08-30: qty 1

## 2013-08-30 MED ORDER — FLUOXETINE HCL 20 MG PO CAPS: 40.0000 mg | ORAL_CAPSULE | Freq: Every day | ORAL | Status: DC

## 2013-08-30 MED ORDER — PANTOPRAZOLE SODIUM 20 MG PO TBEC
20.0000 mg | DELAYED_RELEASE_TABLET | Freq: Two times a day (BID) | ORAL | Status: DC
  Administered 2013-08-30: 20 mg via ORAL
  Filled 2013-08-30 (×2): qty 1

## 2013-08-30 MED ORDER — PANTOPRAZOLE SODIUM 20 MG PO TBEC
20.0000 mg | DELAYED_RELEASE_TABLET | Freq: Two times a day (BID) | ORAL | Status: DC
  Administered 2013-08-30 – 2013-09-05 (×12): 20 mg via ORAL
  Filled 2013-08-30 (×2): qty 1
  Filled 2013-08-30: qty 6
  Filled 2013-08-30 (×4): qty 1
  Filled 2013-08-30: qty 6
  Filled 2013-08-30 (×9): qty 1

## 2013-08-30 MED ORDER — FLUOXETINE HCL 20 MG PO CAPS
40.0000 mg | ORAL_CAPSULE | Freq: Every day | ORAL | Status: DC
  Administered 2013-08-31 – 2013-09-05 (×6): 40 mg via ORAL
  Filled 2013-08-30 (×4): qty 2
  Filled 2013-08-30: qty 6
  Filled 2013-08-30 (×3): qty 2

## 2013-08-30 MED ORDER — ALUM & MAG HYDROXIDE-SIMETH 200-200-20 MG/5ML PO SUSP: 30.0000 mL | ORAL | Status: DC | PRN

## 2013-08-30 MED ORDER — BENZTROPINE MESYLATE 1 MG PO TABS
1.0000 mg | ORAL_TABLET | Freq: Every day | ORAL | Status: DC
  Administered 2013-08-30 – 2013-09-01 (×3): 1 mg via ORAL
  Filled 2013-08-30 (×5): qty 1

## 2013-08-30 NOTE — ED Notes (Signed)
Charge RN, Tiffany[facility] to be [facility] in [facility] ED due to his prior MR diagnosis. Pt is calm, cooperative. Pt is appropriate and follows commands.

## 2013-08-30 NOTE — ED Notes (Addendum)
[facility] Accepted to room 401-1 by Dr.Akintayo. [facility].

## 2013-08-30 NOTE — ED Notes (Signed)
Pt eval by TTS Ava. 

## 2013-08-30 NOTE — ED Notes (Signed)
Pt transferred from triage, presents for medical clearance.  Pt reports SI with plan to drink gasoline and hit himself over the head with a candlestick.  Pt denies SI, HI or AV hallucinations at present.  Pt is MR, diabetic, Bipolar DO from group home.  Pt calm & cooperative at present.

## 2013-08-30 NOTE — Consult Note (Signed)
[facility]   Reason for Consult:  Depression with SI Referring Physician:  ED Phyysician  Joshua BreachShianne Franco is an 23 y.o. male. Total Time spent with patient: 20 minutes  Assessment: AXIS I:  Major Depression, Recurrent severe AXIS II:  Borderline IQ AXIS III:   Past Medical History  Diagnosis Date  . Diabetes mellitus without complication   . Bipolar 1 disorder   . PTSD (post-traumatic stress disorder)   . MR (mental retardation)   . Obesity   . GERD (gastroesophageal reflux disease)   . Hyperlipidemia   . Hyperactive   . HTN (hypertension)   . Anxiety disorder   . Obesity hypoventilation syndrome 05/06/2013  . 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   AXIS IV:  occupational problems, other psychosocial or environmental problems, problems related to social environment and problems with primary support group AXIS V:  41-50 serious symptoms  Plan:  Recommend psychiatric Inpatient admission when medically cleared. Joshua Franco assessed and concurs with plan for admission   Subjective:   Joshua BreachShianne Franco is a 23 y.o. male patient admitted with depression and suicidal ideation with plan   HPI: Patient states he has been increasingly depressed over the past couple weeks. States he has been increasingly stressed at work and has been having relationship difficulties at his current group home placement.  Patient presents with sadness, anhedonia, and increased feelings of hopelessness with a plan to drink gasoline, or to jump in front of a car.  Patient noted to have some superficial self inflicted scratches to left forearm.   HPI Elements:   Location:  generalized. Quality:  acute. Severity:  severe. Timing:  constant. Duration:  couple of weeks. Context:  stressors.  Past Psychiatric History: Past Medical History  Diagnosis Date  . Diabetes mellitus without complication   . Bipolar 1 disorder   . PTSD (post-traumatic stress disorder)   . MR (mental retardation)   .  Obesity   . GERD (gastroesophageal reflux disease)   . Hyperlipidemia   . Hyperactive   . HTN (hypertension)   . Anxiety disorder   . Obesity hypoventilation syndrome 05/06/2013  . 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    reports that he has been smoking Cigarettes.  He has a 10 pack-year smoking history. He has never used smokeless tobacco. He reports that he uses illicit drugs (Methamphetamines). He reports that he does not drink alcohol. Family History  Problem Relation Age of Onset  . Diabetes Father   . Diabetes Maternal Grandmother    Family History Substance Abuse: No Family Supports: Yes, List: Living Arrangements: Other (Comment) Can pt return to current living arrangement?: Yes   Allergies:   Allergies  Allergen Reactions  . Amoxicillin Other (See Comments) and Swelling    Throat swelling  . Penicillins Other (See Comments) and Swelling    Racing heart     ACT Assessment Complete:  Yes:    Educational Status    Risk to Self: Risk to self Suicidal Ideation: Yes-Currently Present Suicidal Intent: Yes-Currently Present Is patient at risk for suicide?: Yes Suicidal Plan?: Yes-Currently Present Specify Current Suicidal Plan: Drinking gasoline (Putting his head through a window.) Access to Means: Yes Specify Access to Suicidal Means: Gasoline and a window What has been your use of drugs/alcohol within the last 12 months?: None Reported Previous Attempts/Gestures: Yes How many times?: 1 Other Self Harm Risks: A Triggers for Past Attempts: None known Family Suicide History: No Recent stressful life event(s): Other (Comment) Persecutory voices/beliefs?: No Depression: Yes Depression  Symptoms: Tearfulness;Isolating;Fatigue;Guilt;Feeling worthless/self pity Substance abuse history and/or treatment for substance abuse?: No Suicide prevention information given to non-admitted patients: Not applicable  Risk to Others: Risk to Others Homicidal Ideation: No Thoughts of Harm to  Others: No Current Homicidal Intent: No Current Homicidal Plan: No Access to Homicidal Means: No Identified Victim: None History of harm to others?: No Assessment of Violence: None Noted Violent Behavior Description: NA Does patient have access to weapons?: No Criminal Charges Pending?: No Does patient have a court date: No  Abuse:    Prior Inpatient Therapy: Prior Inpatient Therapy Prior Inpatient Therapy: Yes Prior Therapy Dates: 01-2013 Prior Therapy Facilty/Provider(s): [facility] Reason for Treatment: SI  Prior Outpatient Therapy: Prior Outpatient Therapy Prior Outpatient Therapy: Yes Prior Therapy Dates:  (Unable to remember.) Prior Therapy Facilty/Provider(s): Unable to remember Reason for Treatment: Unable to remember  Additional Information: Additional Information 1:1 In Past 12 Months?: No CIRT Risk: No Elopement Risk: No Does patient have medical clearance?: No                  Objective: Blood pressure 93/59, pulse 72, temperature 97.7 F (36.5 C), temperature source Oral, resp. rate 17, SpO2 98.00%.There is no weight on file to calculate BMI. Results for orders placed during the hospital encounter of 08/29/13 (from the past 72 hour(s))  URINE RAPID DRUG SCREEN (HOSP PERFORMED)     Status: None   Collection Time    08/30/13 12:02 AM      Result Value Ref Range   Opiates NONE DETECTED  NONE DETECTED   Cocaine NONE DETECTED  NONE DETECTED   Benzodiazepines NONE DETECTED  NONE DETECTED   Amphetamines NONE DETECTED  NONE DETECTED   Tetrahydrocannabinol NONE DETECTED  NONE DETECTED   Barbiturates NONE DETECTED  NONE DETECTED   Comment:            DRUG SCREEN FOR MEDICAL PURPOSES     ONLY.  IF CONFIRMATION IS NEEDED     FOR ANY PURPOSE, NOTIFY LAB     WITHIN 5 DAYS.                LOWEST DETECTABLE LIMITS     FOR URINE DRUG SCREEN     Drug Class       Cutoff (ng/mL)     Amphetamine      1000     Barbiturate      200     Benzodiazepine   200      Tricyclics       300     Opiates          300     Cocaine          300     THC              50  CBC WITH DIFFERENTIAL     Status: Abnormal   Collection Time    08/30/13 12:35 AM      Result Value Ref Range   WBC 7.9  4.0 - 10.5 K/uL   RBC 4.30  4.22 - 5.81 MIL/uL   Hemoglobin 11.8 (*) 13.0 - 17.0 g/dL   HCT 40.9 (*) 81.1 - 91.4 %   MCV 85.3  78.0 - 100.0 fL   MCH 27.4  26.0 - 34.0 pg   MCHC 32.2  30.0 - 36.0 g/dL   RDW 78.2  95.6 - 21.3 %   Platelets 457 (*) 150 - 400 K/uL   Neutrophils Relative % 44  43 - 77 %   Neutro Abs 3.5  1.7 - 7.7 K/uL   Lymphocytes Relative 41  12 - 46 %   Lymphs Abs 3.2  0.7 - 4.0 K/uL   Monocytes Relative 9  3 - 12 %   Monocytes Absolute 0.7  0.1 - 1.0 K/uL   Eosinophils Relative 6 (*) 0 - 5 %   Eosinophils Absolute 0.4  0.0 - 0.7 K/uL   Basophils Relative 1  0 - 1 %   Basophils Absolute 0.0  0.0 - 0.1 K/uL  ETHANOL     Status: None   Collection Time    08/30/13 12:35 AM      Result Value Ref Range   Alcohol, Ethyl (B) <11  0 - 11 mg/dL   Comment:            LOWEST DETECTABLE LIMIT FOR     SERUM ALCOHOL IS 11 mg/dL     FOR MEDICAL PURPOSES ONLY  I-STAT CHEM 8, ED     Status: Abnormal   Collection Time    08/30/13 12:43 AM      Result Value Ref Range   Sodium 141  137 - 147 mEq/L   Potassium 4.3  3.7 - 5.3 mEq/L   Chloride 104  96 - 112 mEq/L   BUN 18  6 - 23 mg/dL   Creatinine, Ser 1.61  0.50 - 1.35 mg/dL   Glucose, Bld 096 (*) 70 - 99 mg/dL   Calcium, Ion 0.45 (*) 1.12 - 1.23 mmol/L   TCO2 24  0 - 100 mmol/L   Hemoglobin 13.6  13.0 - 17.0 g/dL   HCT 40.9  81.1 - 91.4 %  CBG MONITORING, ED     Status: None   Collection Time    08/30/13  8:05 AM      Result Value Ref Range   Glucose-Capillary 85  70 - 99 mg/dL  CBG MONITORING, ED     Status: Abnormal   Collection Time    08/30/13  1:05 PM      Result Value Ref Range   Glucose-Capillary 100 (*) 70 - 99 mg/dL   Labs are reviewed and are pertinent for no medical reasons  Current  Facility-Administered Medications  Medication Dose Route Frequency Provider Last Rate Last Dose  . benztropine (COGENTIN) tablet 1 mg  1 mg Oral QHS Dagmar Hait, MD      . carbamazepine (EQUETRO) 12 hr capsule 200 mg  200 mg Oral BID Dagmar Hait, MD   200 mg at 08/30/13 1057  . carvedilol (COREG) tablet 6.25 mg  6.25 mg Oral BID WC Dagmar Hait, MD   6.25 mg at 08/30/13 0924  . diphenhydrAMINE (BENADRYL) tablet 25 mg  25 mg Oral QHS Dagmar Hait, MD      . FLUoxetine (PROZAC) capsule 20 mg  20 mg Oral Daily Dagmar Hait, MD   20 mg at 08/30/13 1054  . metFORMIN (GLUCOPHAGE) tablet 1,000 mg  1,000 mg Oral BID WC Dagmar Hait, MD   1,000 mg at 08/30/13 0924  . pantoprazole (PROTONIX) EC tablet 20 mg  20 mg Oral BID Dagmar Hait, MD   20 mg at 08/30/13 1054   Current Outpatient Prescriptions  Medication Sig Dispense Refill  . benztropine (COGENTIN) 1 MG tablet Take 1 tablet (1 mg total) by mouth at bedtime.  30 tablet  0  . carbamazepine (EQUETRO) 200 MG CP12 12 hr capsule Take 1 capsule (  200 mg total) by mouth 2 (two) times daily.  60 each  0  . carvedilol (COREG) 6.25 MG tablet Take 1 tablet (6.25 mg total) by mouth 2 (two) times daily with a meal.      . diphenhydrAMINE (BENADRYL) 25 MG tablet Take 1 tablet (25 mg total) by mouth at bedtime.  30 tablet  0  . fenofibrate micronized (LOFIBRA) 134 MG capsule Take 1 capsule (134 mg total) by mouth daily before breakfast.      . FLUoxetine (PROZAC) 20 MG capsule Take 1 capsule (20 mg total) by mouth daily.  30 capsule  0  . fluPHENAZine (PROLIXIN) 5 MG tablet Take 1 tablet (5 mg total) by mouth at bedtime.  30 tablet  0  . metFORMIN (GLUCOPHAGE) 500 MG tablet Take 1,000 mg by mouth 2 (two) times daily with a meal.      . omeprazole (PRILOSEC) 20 MG capsule Take 20 mg by mouth daily.      . simvastatin (ZOCOR) 20 MG tablet Take 1 tablet (20 mg total) by mouth every evening.  30 tablet       Psychiatric Specialty Exam:     Blood pressure 93/59, pulse 72, temperature 97.7 F (36.5 C), temperature source Oral, resp. rate 17, SpO2 98.00%.There is no weight on file to calculate BMI.  General Appearance: Casual  Eye Contact::  Good  Speech:  Clear and Coherent  Volume:  Normal  Mood:  Depressed and Hopeless  Affect:  Congruent  Thought Process:  Goal Directed and Intact  Orientation:  Full (Time, Place, and Person)  Thought Content:  Rumination  Suicidal Thoughts:  Yes.  with intent/plan  Homicidal Thoughts:  No  Memory:  Immediate;   Fair Recent;   Fair Remote;   Fair  Judgement:  Poor  Insight:  Fair  Psychomotor Activity:  Decreased  Concentration:  Fair  Recall:  [employer] of Knowledge:Fair  Language: Good  Akathisia:  No  Handed:  Right  AIMS (if indicated):     Assets:  Housing Resilience Social Support  Sleep:   good    Musculoskeletal: Strength & Muscle Tone: within normal limits Gait & Station: normal Patient leans: N/A  Treatment Plan Summary: Transport to inpatient to [facility] for mood stability when bed available   Nanine Means, PMH-NP 08/30/2013 1:15 PM Personally evaluated the patient, and participated in the assessment and plan Madie Reno A. Dub Mikes, M.D.

## 2013-08-30 NOTE — ED Notes (Signed)
[facility] here to transport to [facility]. Belongings bag x1 given to driver. Denies pain. No complaints voiced. Ambulatory without difficulty. 

## 2013-08-30 NOTE — BH Assessment (Signed)
Assessment Note  Joshua BreachShianne Franco is a 23 y.o. male with SI and a plan to drink gasoline.  Patient reports that he cannot contract for safety.  Patient reports increased depression due to experiencing relationship issues with his current girlfriend  Patient was previously diagnosed with MR, diabetic, Bipolar. Patient reports that he is under a lot of stress at work and with his girlfriend.  Patient currently resides in a Group Home.  Patient reports a previous history of cutting himself.      Patient denies previous physical, sexual or emotional abuse.  Patient denies HI and psychosis. Patient denies substance abuse.  Patient BAL is <11.  Patient UDS is negative.     Axis I: Bipolar, Depressed Axis II: Deferred Axis III:  Past Medical History  Diagnosis Date  . Diabetes mellitus without complication   . Bipolar 1 disorder   . PTSD (post-traumatic stress disorder)   . MR (mental retardation)   . Obesity   . GERD (gastroesophageal reflux disease)   . Hyperlipidemia   . Hyperactive   . HTN (hypertension)   . Anxiety disorder   . Obesity hypoventilation syndrome 05/06/2013  . 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   Axis IV: economic problems, occupational problems, other psychosocial or environmental problems, problems related to social environment, problems with access to health care services and problems with primary support group Axis V: 31-40 impairment in reality testing  Past Medical History:  Past Medical History  Diagnosis Date  . Diabetes mellitus without complication   . Bipolar 1 disorder   . PTSD (post-traumatic stress disorder)   . MR (mental retardation)   . Obesity   . GERD (gastroesophageal reflux disease)   . Hyperlipidemia   . Hyperactive   . HTN (hypertension)   . Anxiety disorder   . Obesity hypoventilation syndrome 05/06/2013  . 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    Past Surgical History  Procedure Laterality Date  . Hand surgery Left     2008    Family History:   Family History  Problem Relation Age of Onset  . Diabetes Father   . Diabetes Maternal Grandmother     Social History:  reports that he has been smoking Cigarettes.  He has a 10 pack-year smoking history. He has never used smokeless tobacco. He reports that he uses illicit drugs (Methamphetamines). He reports that he does not drink alcohol.  Additional Social History:     CIWA: CIWA-Ar BP: 145/79 mmHg Pulse Rate: 78 COWS:    Allergies:  Allergies  Allergen Reactions  . Amoxicillin Other (See Comments)    Throat swelling  . Penicillins Other (See Comments)    Racing heart     Home Medications:  (Not in a hospital admission)  OB/GYN Status:  No LMP for male patient.  General Assessment Data Location of Assessment: [name] Is this a Tele or Face-to-Face Assessment?: Face-to-Face Is this an Initial Assessment or a Re-assessment for this encounter?: Initial Assessment Living Arrangements: Other (Comment) Can pt return to current living arrangement?: Yes Admission Status: Voluntary Is patient capable of signing voluntary admission?: Yes Transfer from: Acute Hospital Referral Source: Self/Family/Friend  Medical Screening Exam [facility] Walk-in ONLY) Medical Exam completed: Yes  [facility] Living Arrangements: Other (Comment) Name of Psychiatrist: Unable to remember.  Name of Therapist: Unable to remember.   Education Status Is patient currently in school?: No Current Grade: NA Highest grade of school patient has completed: NA Name of school: NA Contact person: NA  Risk to self  Suicidal Ideation: Yes-Currently Present Suicidal Intent: Yes-Currently Present Is patient at risk for suicide?: Yes Suicidal Plan?: Yes-Currently Present Specify Current Suicidal Plan: Drinking gasoline (Putting his head through a window.) Access to Means: Yes Specify Access to Suicidal Means: Gasoline and a window What has been your use of drugs/alcohol within the last 12 months?:  None Reported Previous Attempts/Gestures: Yes How many times?: 1 Other Self Harm Risks: A Triggers for Past Attempts: None known Family Suicide History: No Recent stressful life event(s): Other (Comment) Persecutory voices/beliefs?: No Depression: Yes Depression Symptoms: Tearfulness;Isolating;Fatigue;Guilt;Feeling worthless/self pity Substance abuse history and/or treatment for substance abuse?: No Suicide prevention information given to non-admitted patients: Not applicable  Risk to Others Homicidal Ideation: No Thoughts of Harm to Others: No Current Homicidal Intent: No Current Homicidal Plan: No Access to Homicidal Means: No Identified Victim: None History of harm to others?: No Assessment of Violence: None Noted Violent Behavior Description: NA Does patient have access to weapons?: No Criminal Charges Pending?: No Does patient have a court date: No  Psychosis Hallucinations: None noted Delusions: None noted  Mental Status Report Appear/Hygiene: Disheveled Eye Contact: Fair Motor Activity: Freedom of movement Speech: Logical/coherent Level of Consciousness: Alert Mood: Depressed;Anxious;Suspicious Affect: Anxious Anxiety Level: Minimal Thought Processes: Coherent;Relevant Judgement: Unimpaired Orientation: Person;Place;Time;Situation Obsessive Compulsive Thoughts/Behaviors: Minimal  Cognitive Functioning Concentration: Decreased Memory: Recent Intact;Remote Intact IQ: Average Insight: Fair Impulse Control: Poor Appetite: Fair Weight Loss: 0 Weight Gain: 0 Sleep: No Change Total Hours of Sleep: 8 Vegetative Symptoms: Decreased grooming  ADLScreening [facility] Assessment Services) Patient's cognitive ability adequate to safely complete daily activities?: Yes Patient able to express need for assistance with ADLs?: Yes Independently performs ADLs?: Yes (appropriate for developmental age)  Prior Inpatient Therapy Prior Inpatient Therapy: Yes Prior Therapy  Dates: 01-2013 Prior Therapy Facilty/Provider(s): [facility] Reason for Treatment: SI  Prior Outpatient Therapy Prior Outpatient Therapy: Yes Prior Therapy Dates:  (Unable to remember.) Prior Therapy Facilty/Provider(s): Unable to remember Reason for Treatment: Unable to remember  ADL Screening (condition at time of admission) Patient's cognitive ability adequate to safely complete daily activities?: Yes Patient able to express need for assistance with ADLs?: Yes Independently performs ADLs?: Yes (appropriate for developmental age)         Values / Beliefs Cultural Requests During Hospitalization: None Spiritual Requests During Hospitalization: None        Additional Information 1:1 In Past 12 Months?: No CIRT Risk: No Elopement Risk: No Does patient have medical clearance?: No     Disposition:  Disposition Initial Assessment Completed for this Encounter: Yes Disposition of Patient: Inpatient treatment program Type of inpatient treatment program: Adult  On Site Evaluation by:   Reviewed with Physician:    Clyde Canterbury 08/30/2013 4:08 AM

## 2013-08-30 NOTE — Tx Team (Signed)
Initial Interdisciplinary Treatment Plan  PATIENT STRENGTHS: (choose at least two) Motivation for treatment/growth Supportive family/friends Work skills  PATIENT STRESSORS: stated his girlfriend is pregnant   PROBLEM LIST: Problem List/Patient Goals Date to be addressed Date deferred Reason deferred Estimated date of resolution  M/R 08-30-13           Suicide w/ a plan to drink gasoline.  08-30-13           Major depression , recurrent severe.  08-30-13                              DISCHARGE CRITERIA:  Adequate post-discharge living arrangements Improved stabilization in mood, thinking, and/or behavior Reduction of life-threatening or endangering symptoms to within safe limits Verbal commitment to aftercare and medication compliance  PRELIMINARY DISCHARGE PLAN: Return to previous living arrangement Return to previous work or school arrangements  PATIENT/FAMIILY INVOLVEMENT: This treatment plan has been presented to and reviewed with the patient, Joshua Franco, and/or family member, .  The patient and family have been given the opportunity to ask questions and make suggestions.  Joshua Franco 08/30/2013, 6:29 PM

## 2013-08-30 NOTE — ED Notes (Signed)
[facility]

## 2013-08-30 NOTE — Progress Notes (Addendum)
Patient ID: Joshua BreachShianne Franco, male   DOB: 10/05/1990, 23 y.o.   MRN: [id number] 08-30-13 @ 1730 nursing admission note: [facility] His ph # is [phone]. This pt is mild m/r and is a poor historian. He was able to contract for SI on admission and denied any hi/av.  He came to this facility because he had threatened to drink gasoline. He had no complaints of pain, is allergic to amoxicillin and PCN. His lbm was 08-30-13. He has been to this facility before, was escorted to the 400 hall and went to dinner in the cafeteria. rn will attempt to complete the admission when the patient returns from dinner. Pt returned from dinner and the admission is complete.  Pt was polite/cooperative on admission.   Emergency contact ke[facility] cell ph # [phone]

## 2013-08-30 NOTE — Progress Notes (Signed)
[facility] Group Notes:  (Nursing/MHT/Case Management/Adjunct)  Date:  08/30/2013  Time:  8:00 p.m.   Type of Therapy:  Psychoeducational Skills  Participation Level:  Active  Participation Quality:  Attentive  Affect:  Anxious  Cognitive:  Lacking  Insight:  Limited  Engagement in Group:  Lacking  Modes of Intervention:  Education  Summary of Progress/Problems: The patient shared in group this evening that he had a bad day overall. The patient briefly mentioned that he had a bad experience at the group home today, but was relieved to be in the hospital. In terms of the theme for the day, he was unable to state who his support system is going to be.   Westly PamBenjamin S Lashawn Bromwell 08/30/2013, 9:53 PM

## 2013-08-30 NOTE — Progress Notes (Signed)
Per, NP Drenda FreezeFran patient meets criteria for inpatient hospitalization. [facility]  Writer will refer patient to other facilities.

## 2013-08-31 ENCOUNTER — Encounter (HOSPITAL_COMMUNITY): Payer: Self-pay | Admitting: Psychiatry

## 2013-08-31 DIAGNOSIS — F314 Bipolar disorder, current episode depressed, severe, without psychotic features: Principal | ICD-10-CM

## 2013-08-31 DIAGNOSIS — R45851 Suicidal ideations: Secondary | ICD-10-CM

## 2013-08-31 LAB — GLUCOSE, CAPILLARY: Glucose-Capillary: 103 mg/dL — ABNORMAL HIGH (ref 70–99)

## 2013-08-31 MED ORDER — NICOTINE 21 MG/24HR TD PT24
21.0000 mg | MEDICATED_PATCH | Freq: Every day | TRANSDERMAL | Status: DC
  Administered 2013-08-31 – 2013-09-04 (×4): 21 mg via TRANSDERMAL
  Filled 2013-08-31 (×8): qty 1

## 2013-08-31 MED ORDER — TRAZODONE HCL 50 MG PO TABS
50.0000 mg | ORAL_TABLET | Freq: Every evening | ORAL | Status: DC | PRN
  Administered 2013-09-01 – 2013-09-04 (×3): 50 mg via ORAL
  Filled 2013-08-31 (×3): qty 1
  Filled 2013-08-31: qty 3
  Filled 2013-08-31: qty 1

## 2013-08-31 NOTE — Progress Notes (Signed)
Patient ID: Joshua BreachShianne Franco, male   DOB: 05/08/1991, 23 y.o.   MRN: [id number] Psychoeducational Group Note  Date:  08/31/2013 Time:  0900am  Group Topic/Focus:  Making Healthy Choices:   The focus of this group is to help patients identify negative/unhealthy choices they were using prior to admission and identify positive/healthier coping strategies to replace them upon discharge.  Participation Level:  Active  Participation Quality:  Appropriate  Affect:  Excited  Cognitive:  Lacking and due to low IQ  Insight:  Monopolizing  Engagement in Group:  Monopolizing  Additional Comments:  Inventory group   Valente DavidWeaver, Malgorzata Albert Brooks 08/31/2013,10:00 AM

## 2013-08-31 NOTE — Progress Notes (Addendum)
Patient ID: Joshua Franco, male   DOB: 08-May-1991, 23 y.o.   MRN: [id number] Patient ID: Shyne Lehrke, male DOB: April 07, 1991, 23 y.o. MRN: [id number]  D. The patient is very engaging and social on the unit. His mood and affect are not congruent with what he reports. He stated he is depressed and had been suicidal with a plan to drink gasoline. He is  Interacting well with his peers in the milieu. He is not showing any s/s of depression and denied any hi/av.  A. RN: met with this patient to assess him. He is attending his groups. RN explained his medications to him while they were being administered. He requested a nicotine patch and it was administered.  An order was obtained for a CBG QD. It was 103 at 1723 done today. R. On his inventory sheet he wrote: slept poor, appetite good, energy normal, attention good with his depression and hopelessness both at 10. He stated he is having some passive SI, but is able to contract. No physical problems and no pain. RN will monitor and Q 15 min ck's continue.

## 2013-08-31 NOTE — Progress Notes (Signed)
Patient ID: Joshua Franco, male   DOB: 07/01/1990, 23 y.o.   MRN: [id number] D. The patient is bright and social on the unit. His mood and affect are not congruent with what he reports. He stated he is depressed and had been suicidal with a plan to drink gasoline. He was smiling and laughing as he reported this. Interacting well in the milieu.  A. Met with the patient to assess. Encouraged to attend evening group. Reviewed and administered HS medication. R. The patient is able to verbally contract for safety. Attended and participated in evening group. Compliant with medication.

## 2013-08-31 NOTE — Progress Notes (Signed)
Patient ID: Ernst BreachShianne Loring, male   DOB: 06/03/1990, 23 y.o.   MRN: [id number] Psychoeducational Group Note  Date:  08/31/2013 Time:  0930am  Group Topic/Focus:  Making Healthy Choices:   The focus of this group is to help patients identify negative/unhealthy choices they were using prior to admission and identify positive/healthier coping strategies to replace them upon discharge.  Participation Level:  Active  Participation Quality:  Appropriate  Affect:  Appropriate  Cognitive:  Lacking  Insight:  Monopolizing  Engagement in Group:  Monopolizing  Additional Comments:  Healthy support   Valente DavidWeaver, Konnor Jorden Brooks 08/31/2013,10:01 AM

## 2013-08-31 NOTE — [facility] Group Notes (Signed)
[facility] LCSW Group Therapy Note 08/31/2013 / 11 AM  Type of Therapy and Topic:  Group Therapy: Avoiding Self-Sabotaging and Enabling Behaviors  Participation Level:  Minimal  Description of Group:     Learn how to identify obstacles, self-sabotaging and enabling behaviors, what are they, why do we do them and what needs do these behaviors meet? Discuss unhealthy relationships and how to have positive healthy boundaries with those that sabotage and enable. Explore aspects of self-sabotage and enabling in yourself and how to limit these self-destructive behaviors in everyday life.  Therapeutic Goals: 1. Patient will identify one obstacle that relates to self-sabotage and enabling behaviors 2. Patient will identify one personal self-sabotaging or enabling behavior they did prior to admission 3. Patient will demonstrate ability to communicate their needs through discussion and/or role plays.   Summary of Patient Progress: The main focus of today's process group was to explain to the adolescent what "self-sabotage" means and use Motivational Interviewing to discuss what benefits, negative or positive, were involved in a self-identified self-sabotaging behavior. We then talked about reasons the patient may want to change the behavior and her current desire to change. Pt's were given opportunity to identify their self sabotaging behaviors and identify where they are in cycle of change. Joshua Franco was in and out of group room; returned at one point with lotion on face as if it were war paint; or it could have been like the shading for football as he expressed desire to play football. Patient did not participate.    Therapeutic Modalities:   Cognitive Behavioral Therapy Person-Centered Therapy Motivational Interviewing   Carney Bernatherine C Harrill, LCSW
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
## 2013-08-31 NOTE — Progress Notes (Signed)
Did not attend group 

## 2013-08-31 NOTE — Progress Notes (Signed)
Patient ID: Joshua Franco, male   DOB: 08/27/1990, 23 y.o.   MRN: [id number] D. The patient spent the whole evening resting in bed with eyes closed. A. Attempt made to awaken patient for group. Administered HS medication. R. The patient did not attend evening group. Compliant with medication.

## 2013-08-31 NOTE — [facility] Suicide Risk Assessment (Signed)
   Nursing information obtained from:  Patient Demographic factors:  Male;Adolescent or young adult;Caucasian Current Mental Status:  Suicidal ideation indicated by patient Loss Factors:  Financial problems / change in socioeconomic status Historical Factors:  Prior suicide attempts;Family history of suicide;Family history of mental illness or substance abuse Risk Reduction Factors:  Sense of responsibility to family;Religious beliefs about death;Positive social support Total Time spent with patient: 30 minutes  CLINICAL FACTORS:   Severe Anxiety and/or Agitation Bipolar Disorder:   Depressive phase Depression:   Aggression Anhedonia Hopelessness Impulsivity Insomnia Unstable or Poor Therapeutic Relationship  Psychiatric Specialty Exam: Physical Exam  Psychiatric: His mood appears anxious. His speech is rapid and/or pressured. He is agitated and aggressive. Cognition and memory are normal. He expresses impulsivity. He exhibits a depressed mood. He expresses suicidal ideation.    Review of Systems  Constitutional: Negative.   HENT: Negative.   Eyes: Negative.   Respiratory: Negative.   Cardiovascular: Negative.   Gastrointestinal: Negative.   Genitourinary: Negative.   Musculoskeletal: Negative.   Skin: Negative.   Endo/Heme/Allergies: Negative.   Psychiatric/Behavioral: Positive for depression and suicidal ideas. The patient is nervous/anxious and has insomnia.     Blood pressure 119/86, pulse 74, temperature 97.6 F (36.4 C), temperature source Oral, resp. rate 18, height 5\' 7"  (1.702 m), weight 114.306 kg (252 lb), SpO2 97.00%.Body mass index is 39.46 kg/(m^2).  General Appearance: Fairly Groomed  Patent attorneyye Contact::  Good  Speech:  Pressured  Volume:  Increased  Mood:  Anxious and Depressed  Affect:  Depressed  Thought Process:  Disorganized  Orientation:  Full (Time, Place, and Person)  Thought Content:  Negative  Suicidal Thoughts:  Yes.  without intent/plan  Homicidal  Thoughts:  No  Memory:  Immediate;   Fair Recent;   Fair Remote;   Fair  Judgement:  Poor  Insight:  Lacking  Psychomotor Activity:  Decreased  Concentration:  Fair  Recall:  [employer]Fair  Fund of Knowledge:Fair  Language: Good  Akathisia:  No  Handed:  Right  AIMS (if indicated):     Assets:  Communication Skills Desire for Improvement Physical Health  Sleep:  Number of Hours: 5   Musculoskeletal: Strength & Muscle Tone: within normal limits Gait & Station: normal Patient leans: N/A  COGNITIVE FEATURES THAT CONTRIBUTE TO RISK:  Closed-mindedness Polarized thinking    SUICIDE RISK:   Mild:  Suicidal ideation of limited frequency, intensity, duration, and specificity.  There are no identifiable plans, no associated intent, mild dysphoria and related symptoms, good self-control (both objective and subjective assessment), few other risk factors, and identifiable protective factors, including available and accessible social support.  PLAN OF CARE:1. Admit for crisis management and stabilization. 2. Medication management to reduce current symptoms to base line and improve the     patient's overall level of functioning 3. Treat health problems as indicated. 4. Develop treatment plan to decrease risk of relapse upon discharge and the need for     readmission. 5. Psycho-social education regarding relapse prevention and self care. 6. Health care follow up as needed for medical problems. 7. Restart home medications where appropriate.   I certify that inpatient services furnished can reasonably be expected to improve the patient's condition.  Thedore MinsMojeed Orvella Digiulio, MD 08/31/2013, 3:20 PM

## 2013-09-01 DIAGNOSIS — F3130 Bipolar disorder, current episode depressed, mild or moderate severity, unspecified: Secondary | ICD-10-CM

## 2013-09-01 LAB — GLUCOSE, CAPILLARY
Glucose-Capillary: 102 mg/dL — ABNORMAL HIGH (ref 70–99)
Glucose-Capillary: 106 mg/dL — ABNORMAL HIGH (ref 70–99)

## 2013-09-01 MED ORDER — HYDROXYZINE HCL 25 MG PO TABS: 25.0000 mg | ORAL_TABLET | Freq: Four times a day (QID) | ORAL | Status: DC | PRN

## 2013-09-01 MED ORDER — OLANZAPINE 5 MG PO TBDP
5.0000 mg | ORAL_TABLET | Freq: Once | ORAL | Status: AC
  Administered 2013-09-01: 5 mg via ORAL
  Filled 2013-09-01 (×2): qty 1

## 2013-09-01 NOTE — Progress Notes (Signed)
D: Pt presents with flat affect and depressed mood. Pt rates depression 10/10. Pt reports passive SI. Pt verbally contracts for safety, and verbalized to writer what it means to contract for safety. Pt has minimal interaction on the milieu and isolates in his room this morning. Pt reports seeing images of his dead father. Pt denies any auditory hallucinations at this time. A: Medications administered as ordered per MD. Verbal support given. Pt encouraged to attend groups. 15 minute checks performed for safety. R: Pt safety maintained at this time.
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
## 2013-09-01 NOTE — Tx Team (Signed)
Interdisciplinary Treatment Plan Update (Adult)  Date: 09/01/2013  Time Reviewed:  9:45 AM  Progress in Treatment: Attending groups: Yes Participating in groups:  Yes Taking medication as prescribed:  Yes Tolerating medication:  Yes Family/Significant othe contact made: CSW assessing  Patient understands diagnosis:  Yes Discussing patient identified problems/goals with staff:  Yes Medical problems stabilized or resolved:  Yes Denies suicidal/homicidal ideation: Yes Issues/concerns per patient self-inventory:  Yes Other:  New problem(s) identified: N/A  Discharge Plan or Barriers: CSW assessing for appropriate referrals.  Reason for Continuation of Hospitalization: Anxiety Depression Medication Stabilization Psychosis  Comments: N/A  Estimated length of stay: 5-7 days  For review of initial/current patient goals, please see plan of care.  Attendees: Patient:     Family:     Physician:  Dr. Mojeed Akintayo 09/01/2013 11:16 AM   Nursing:   Linsey Squires, RN 09/01/2013 11:16 AM   Clinical Social Worker:  Bodee Lafoe Horton, LCSW 09/01/2013 11:16 AM   Other: Patrice White, RN 09/01/2013 11:16 AM   Other:     Other:     Other:     Other:    Other:    Other:    Other:    Other:    Other:     Scribe for Treatment Team:   Horton, Retha Bither Nicole, 09/01/2013 11:16 AM        
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
## 2013-09-01 NOTE — [facility] Group Notes (Signed)
[facility]   09/01/2013  8:45 AM  Participation Quality:  Did Not Attend - pt sleeping in his room  Joshua IvanChelsea Horton, LCSW 09/01/2013 10:33 AM

## 2013-09-01 NOTE — Progress Notes (Signed)
The focus of this group is to help patients review their daily goal of treatment and discuss progress on daily workbooks. Pt attended the evening group session and responded to all discussion prompts from the Writer. Pt shared that today was a good day on the unit, the highlights of which were going to the courtyard and getting a new roommate. Pt's only additional request from Nursing Staff this evening was to be allowed to shave, which was taken care of following group. When asked how he appeared when well, Pt responded, "You know I'm well when I'm smiling and playing spades and basketball." Pt's affect was appropriate.

## 2013-09-01 NOTE — Progress Notes (Signed)
D   Pt is laughing and socializing   He acted very childlike and silly when approached and introduced to his nurse   He admits to feeling stressed out and reports passive suicidal ideation but contracts for safety   He reports feeling depressed and anxious A   Verbal support given  Medications administered and effectiveness monitored   Q 15 min checks R   Pt safe at present

## 2013-09-01 NOTE — Progress Notes (Signed)
[facility]  09/01/2013 11:58 AM Joshua BreachShianne Franco  MRN:  [id number] Subjective:  "I can' t do this man. There are just too many people here. This is really hard for me with all these people and these situations. I just can't do this much anymore. I feel like I want to kill myself. I'm stressed out so much. I need to relax, but I really can't. I don't know if I want to go back to the same group home. I'm not really sure but I can't be here either."   Objective: Pt is seen in the dayroom socializing with others. When assessing, pt appears to be very anxious and is fidgety. Pt reports SI stating that he cannot be around large groups and that the people in the groups in the cafeteria and on the hallway are stressing him out. Pt states that he has a lot of anxiety about going back to the group home as well. Pt reports SI without plan, contracts for safety inpatient but cannot contract if outpatient. Pt denies HI. Pt reports AH with commands to kill self, but denies VH.    Diagnosis:   DSM5: Depressive Disorders: Major Depressive Disorder - with Psychotic Features (296.24) Total Time spent with patient: 20 minutes  Axis I: Bipolar, Depressed Axis II: Deferred Axis III:  Past Medical History  Diagnosis Date  . Diabetes mellitus without complication   . Bipolar 1 disorder   . PTSD (post-traumatic stress disorder)   . MR (mental retardation)   . Obesity   . GERD (gastroesophageal reflux disease)   . Hyperlipidemia   . Hyperactive   . HTN (hypertension)   . Anxiety disorder   . Obesity hypoventilation syndrome 05/06/2013  . 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 Depression    Axis IV: other psychosocial or environmental problems and problems related to social environment Axis V: 21-30 behavior considerably influenced by delusions or hallucinations OR serious impairment in judgment, communication OR inability to function in almost all areas  ADL's:  Intact  Sleep: Fair  Appetite:   Good  Suicidal Ideation:  SI without plan Homicidal Ideation:  Denies AEB (as evidenced by):  Psychiatric Specialty Exam: Physical Exam  Review of Systems  Constitutional: Negative.   HENT: Negative.   Eyes: Negative.   Respiratory: Negative.   Cardiovascular: Negative.   Gastrointestinal: Negative.   Genitourinary: Negative.   Musculoskeletal: Negative.   Skin: Negative.   Neurological: Negative.   Endo/Heme/Allergies: Negative.   Psychiatric/Behavioral: Positive for depression, suicidal ideas and hallucinations. The patient is nervous/anxious and has insomnia.     Blood pressure 133/78, pulse 87, temperature 97.8 F (36.6 C), temperature source Oral, resp. rate 16, height 5\' 7"  (1.702 m), weight 114.306 kg (252 lb), SpO2 97.00%.Body mass index is 39.46 kg/(m^2).  General Appearance: Bizarre  Eye Contact::  Minimal  Speech:  Pressured  Volume:  Normal  Mood:  Anxious  Affect:  Appropriate  Thought Process:  Circumstantial and Disorganized  Orientation:  Full (Time, Place, and Person)  Thought Content:  Rumination  Suicidal Thoughts:  Yes.  without intent/plan  Homicidal Thoughts:  No  Memory:  Immediate;   Fair Recent;   Fair Remote;   Fair  Judgement:  Impaired  Insight:  Lacking  Psychomotor Activity:  Increased  Concentration:  Good  Recall:  Good  Fund of Knowledge:Fair  Language: Fair  Akathisia:  NA  Handed:    AIMS (if indicated):     Assets:  Desire for Improvement Resilience  Sleep:  Number of Hours: 6.75   Musculoskeletal: Strength & Muscle Tone: within normal limits Gait & Station: normal Patient leans: N/A  Current Medications: Current Facility-Administered Medications  Medication Dose Route Frequency Provider Last Rate Last Dose  . acetaminophen (TYLENOL) tablet 650 mg  650 mg Oral Q6H PRN Nanine Means, NP      . alum & mag hydroxide-simeth (MAALOX/MYLANTA) 200-200-20 MG/5ML suspension 30 mL  30 mL Oral Q4H PRN Nanine Means, NP      .  benztropine (COGENTIN) tablet 1 mg  1 mg Oral QHS Nanine Means, NP   1 mg at 08/31/13 2157  . carbamazepine (EQUETRO) 12 hr capsule 200 mg  200 mg Oral BID Nanine Means, NP   200 mg at 09/01/13 0801  . carvedilol (COREG) tablet 6.25 mg  6.25 mg Oral BID WC Nanine Means, NP   6.25 mg at 09/01/13 0801  . fenofibrate tablet 160 mg  160 mg Oral Daily Nanine Means, NP   160 mg at 09/01/13 0801  . FLUoxetine (PROZAC) capsule 40 mg  40 mg Oral Daily Nanine Means, NP   40 mg at 09/01/13 0801  . fluPHENAZine (PROLIXIN) tablet 5 mg  5 mg Oral QHS Nanine Means, NP   5 mg at 08/31/13 2157  . magnesium hydroxide (MILK OF MAGNESIA) suspension 30 mL  30 mL Oral Daily PRN Nanine Means, NP      . metFORMIN (GLUCOPHAGE) tablet 1,000 mg  1,000 mg Oral BID WC Nanine Means, NP   1,000 mg at 09/01/13 0801  . nicotine (NICODERM CQ - dosed in mg/24 hours) patch 21 mg  21 mg Transdermal Daily Konica Stankowski   21 mg at 09/01/13 0801  . pantoprazole (PROTONIX) EC tablet 20 mg  20 mg Oral BID Nanine Means, NP   20 mg at 09/01/13 0801  . simvastatin (ZOCOR) tablet 20 mg  20 mg Oral q1800 Nanine Means, NP   20 mg at 08/31/13 1559  . traZODone (DESYREL) tablet 50 mg  50 mg Oral QHS PRN Keiyon Plack        Lab Results:  Results for orders placed during the hospital encounter of 08/30/13 (from the past 48 hour(s))  GLUCOSE, CAPILLARY     Status: Abnormal   Collection Time    08/30/13  5:05 PM      Result Value Ref Range   Glucose-Capillary 121 (*) 70 - 99 mg/dL  GLUCOSE, CAPILLARY     Status: Abnormal   Collection Time    08/31/13  5:25 PM      Result Value Ref Range   Glucose-Capillary 103 (*) 70 - 99 mg/dL  GLUCOSE, CAPILLARY     Status: Abnormal   Collection Time    09/01/13  5:57 AM      Result Value Ref Range   Glucose-Capillary 102 (*) 70 - 99 mg/dL    Physical Findings: AIMS: Facial and Oral Movements Muscles of Facial Expression: None, normal Lips and Perioral Area: None, normal Jaw: None,  normal Tongue: None, normal,Extremity Movements Upper (arms, wrists, hands, fingers): None, normal Lower (legs, knees, ankles, toes): None, normal, Trunk Movements Neck, shoulders, hips: None, normal, Overall Severity Severity of abnormal movements (highest score from questions above): None, normal Incapacitation due to abnormal movements: None, normal Patient's awareness of abnormal movements (rate only patient's report): No Awareness, Dental Status Current problems with teeth and/or dentures?: No Does patient usually wear dentures?: No  CIWA:  CIWA-Ar Total: 0 COWS:     Treatment  Plan Summary: Daily contact with patient to assess and evaluate symptoms and progress in treatment Medication management  Plan: Review of chart, vital signs, medications, and notes.  1-Individual and group therapy  2-Medication management for depression and anxiety: Medications reviewed with the patient and she stated no untoward effects.  -Add Vistaril 25mg  q6h PRN anxiety -Zyprexa 5mg  once for severe anxiety  3-Coping skills for depression, anxiety  4-Continue crisis stabilization and management  5-Address health issues--monitoring vital signs, stable  6-Treatment plan in progress to prevent relapse of depression and anxiety  Medical Decision Making Problem Points:  Established problem, worsening (2), Review of last therapy session (1) and Review of psycho-social stressors (1) Data Points:  Review or order clinical lab tests (1) Review or order medicine tests (1) Review of medication regiment & side effects (2) Review of new medications or change in dosage (2)  I certify that inpatient services furnished can reasonably be expected to improve the patient's condition.   Beau FannyJohn C Withrow, FNP-BC 09/01/2013, 11:58 AM Patient seen, evaluated and I agree with notes by Nurse Practitioner. Thedore MinsMojeed Zarai Orsborn, MD

## 2013-09-02 LAB — GLUCOSE, CAPILLARY
GLUCOSE-CAPILLARY: 99 mg/dL (ref 70–99)
Glucose-Capillary: 145 mg/dL — ABNORMAL HIGH (ref 70–99)

## 2013-09-02 MED ORDER — BENZTROPINE MESYLATE 0.5 MG PO TABS
0.5000 mg | ORAL_TABLET | Freq: Two times a day (BID) | ORAL | Status: DC
  Administered 2013-09-02 – 2013-09-05 (×6): 0.5 mg via ORAL
  Filled 2013-09-02 (×3): qty 1
  Filled 2013-09-02: qty 6
  Filled 2013-09-02: qty 1
  Filled 2013-09-02: qty 6
  Filled 2013-09-02 (×4): qty 1

## 2013-09-02 MED ORDER — FLUPHENAZINE HCL 5 MG PO TABS
5.0000 mg | ORAL_TABLET | Freq: Two times a day (BID) | ORAL | Status: DC
  Administered 2013-09-02 – 2013-09-05 (×6): 5 mg via ORAL
  Filled 2013-09-02: qty 1
  Filled 2013-09-02: qty 6
  Filled 2013-09-02 (×2): qty 1
  Filled 2013-09-02: qty 6
  Filled 2013-09-02 (×5): qty 1

## 2013-09-02 NOTE — Progress Notes (Signed)
Adult Psychoeducational Group Note  Date:  09/02/2013 Time:  9:15 PM  Group Topic/Focus:  Wrap-Up Group:   The focus of this group is to help patients review their daily goal of treatment and discuss progress on daily workbooks.  Participation Level:  Active  Participation Quality:  Appropriate  Affect:  Appropriate  Cognitive:  Alert and Oriented  Insight: Appropriate  Engagement in Group:  Developing/Improving  Modes of Intervention:  Clarification, Exploration and Support  Additional Comments: Patient stated that one positive is that he was able to speak to his kids. 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  Joshua Franco Joshua Franco 09/02/2013, 9:15 PM

## 2013-09-02 NOTE — Progress Notes (Signed)
D: Pt presents with flat affect and depressed mood. Pt appears withdrawn at times throughout the morning. Pt has periods of isolating in his room. Pt has minimal interaction on the milieu and little engagement. Pt rates depression 8/10. Pt endorse AH telling him to kill himself. Pt reports passive SI. Pt verbally contracts not to harm himself and to report worsening SI. A: Medications administered as ordered per MD. Verbal support given, pt encouraged to attend groups and 15 minute checks performed for safety. R: Pt safety maintained at this time.

## 2013-09-02 NOTE — Progress Notes (Signed)
D   Pt is laughing and socializing   He acted very childlike and silly when approached and introduced to his nurse   He admits to feeling stressed out and reports passive suicidal ideation but contracts for safety   He reports feeling depressed and anxious   Pt requested an smoking patch but was told he would get in in the morning due to the potential for it to disturb his sleep A   Verbal support given  Medications administered and effectiveness monitored   Q 15 min checks R   Pt safe at present   He verbalized understanding

## 2013-09-02 NOTE — Care Management Utilization Note (Signed)
   Per State Regulation 482.30  This chart was reviewed for necessity with respect to the patient's Admission/ Duration of stay.  Next review date: 09/05/13  Nicolasa Duckingrystal Morrison RN, BSN

## 2013-09-02 NOTE — Progress Notes (Signed)
Patient ID: Joshua BreachShianne Standifer, male   DOB: 09/12/1990, 23 y.o.   MRN: [id number] [facility]  09/02/2013 11:16 AM Joshua BreachShianne Shackleford  MRN:  [id number] Subjective:   Patient states "I came here for making threats to hurt myself. I still want to drink gasoline and set myself on fire. I hear voices telling me to kill myself. I hate the day program I go to. People talk about me behind my back. I am very depressed."   Objective:  Patient assessed in his room today. He appears withdrawn making no eye contact with Provider. Rates his depression at nine and his anxiety at ten. Patient has reported feeling very stressed out from social interactions. However, he has also been observed socializing in the dayroom in no apparent distress. Nursing staff report that he has been isolating in his room with minimal interaction on the unit so far today. Patient rates his depression at nine and his anxiety at ten. The patient remains compliant with his medications and denies adverse effects.   Diagnosis:   DSM5: Depressive Disorders: Major Depressive Disorder - with Psychotic Features (296.24) Total Time spent with patient: 20 minutes  Axis I: Bipolar, Depressed Axis II: Deferred Axis III:  Past Medical History  Diagnosis Date  . Diabetes mellitus without complication   . Bipolar 1 disorder   . PTSD (post-traumatic stress disorder)   . MR (mental retardation)   . Obesity   . GERD (gastroesophageal reflux disease)   . Hyperlipidemia   . Hyperactive   . HTN (hypertension)   . Anxiety disorder   . Obesity hypoventilation syndrome 05/06/2013  . 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 Depression    Axis IV: other psychosocial or environmental problems and problems related to social environment Axis V: 21-30 behavior considerably influenced by delusions or hallucinations OR serious impairment in judgment, communication OR inability to function in almost all areas  ADL's:  Intact  Sleep: Good  Appetite:   Good  Suicidal Ideation:  Passive SI with plan Homicidal Ideation:  Denies AEB (as evidenced by):  Psychiatric Specialty Exam: Physical Exam  Review of Systems  Constitutional: Negative.   HENT: Negative.   Eyes: Negative.   Respiratory: Negative.   Cardiovascular: Negative.   Gastrointestinal: Negative.   Genitourinary: Negative.   Musculoskeletal: Negative.   Skin: Negative.   Neurological: Negative.   Endo/Heme/Allergies: Negative.   Psychiatric/Behavioral: Positive for depression, suicidal ideas and hallucinations. Negative for memory loss and substance abuse. The patient is nervous/anxious and has insomnia.     Blood pressure 114/75, pulse 84, temperature 97.8 F (36.6 C), temperature source Oral, resp. rate 16, height 5\' 7"  (1.702 m), weight 114.306 kg (252 lb), SpO2 97.00%.Body mass index is 39.46 kg/(m^2).  General Appearance: Disheveled  Eye Contact::  Poor  Speech:  Pressured  Volume:  Normal  Mood:  Dysphoric  Affect:  Appropriate and Blunt  Thought Process:  Circumstantial and Disorganized  Orientation:  Full (Time, Place, and Person)  Thought Content:  Hallucinations: Auditory and Paranoid Ideation  Suicidal Thoughts:  Yes.  with intent/plan  Homicidal Thoughts:  No  Memory:  Immediate;   Fair Recent;   Fair Remote;   Fair  Judgement:  Impaired  Insight:  Lacking  Psychomotor Activity:  Increased  Concentration:  Good  Recall:  Good  Fund of Knowledge:Fair  Language: Fair  Akathisia:  NA  Handed:    AIMS (if indicated):     Assets:  Desire for Improvement Resilience  Sleep:  Number  of Hours: 6   Musculoskeletal: Strength & Muscle Tone: within normal limits Gait & Station: normal Patient leans: N/A  Current Medications: Current Facility-Administered Medications  Medication Dose Route Frequency Provider Last Rate Last Dose  . acetaminophen (TYLENOL) tablet 650 mg  650 mg Oral Q6H PRN Nanine Means, NP      . alum & mag hydroxide-simeth  (MAALOX/MYLANTA) 200-200-20 MG/5ML suspension 30 mL  30 mL Oral Q4H PRN Nanine Means, NP      . benztropine (COGENTIN) tablet 1 mg  1 mg Oral QHS Nanine Means, NP   1 mg at 09/01/13 2121  . carbamazepine (EQUETRO) 12 hr capsule 200 mg  200 mg Oral BID Nanine Means, NP   200 mg at 09/02/13 0750  . carvedilol (COREG) tablet 6.25 mg  6.25 mg Oral BID WC Nanine Means, NP   6.25 mg at 09/02/13 0750  . fenofibrate tablet 160 mg  160 mg Oral Daily Nanine Means, NP   160 mg at 09/02/13 0750  . FLUoxetine (PROZAC) capsule 40 mg  40 mg Oral Daily Nanine Means, NP   40 mg at 09/02/13 0750  . fluPHENAZine (PROLIXIN) tablet 5 mg  5 mg Oral QHS Nanine Means, NP   5 mg at 09/01/13 2121  . hydrOXYzine (ATARAX/VISTARIL) tablet 25 mg  25 mg Oral Q6H PRN Beau Fanny, FNP      . magnesium hydroxide (MILK OF MAGNESIA) suspension 30 mL  30 mL Oral Daily PRN Nanine Means, NP      . metFORMIN (GLUCOPHAGE) tablet 1,000 mg  1,000 mg Oral BID WC Nanine Means, NP   1,000 mg at 09/02/13 0750  . nicotine (NICODERM CQ - dosed in mg/24 hours) patch 21 mg  21 mg Transdermal Daily Finneus Kaneshiro   21 mg at 09/01/13 0801  . pantoprazole (PROTONIX) EC tablet 20 mg  20 mg Oral BID Nanine Means, NP   20 mg at 09/02/13 0750  . simvastatin (ZOCOR) tablet 20 mg  20 mg Oral q1800 Nanine Means, NP   20 mg at 09/01/13 1823  . traZODone (DESYREL) tablet 50 mg  50 mg Oral QHS PRN Trenia Tennyson   50 mg at 09/01/13 2123    Lab Results:  Results for orders placed during the hospital encounter of 08/30/13 (from the past 48 hour(s))  GLUCOSE, CAPILLARY     Status: Abnormal   Collection Time    08/31/13  5:25 PM      Result Value Ref Range   Glucose-Capillary 103 (*) 70 - 99 mg/dL  GLUCOSE, CAPILLARY     Status: Abnormal   Collection Time    09/01/13  5:57 AM      Result Value Ref Range   Glucose-Capillary 102 (*) 70 - 99 mg/dL  GLUCOSE, CAPILLARY     Status: Abnormal   Collection Time    09/01/13  4:58 PM      Result Value Ref  Range   Glucose-Capillary 106 (*) 70 - 99 mg/dL   Comment 1 Documented in Chart     Comment 2 Notify RN    GLUCOSE, CAPILLARY     Status: None   Collection Time    09/02/13  6:16 AM      Result Value Ref Range   Glucose-Capillary 99  70 - 99 mg/dL   Comment 1 Notify RN      Physical Findings: AIMS: Facial and Oral Movements Muscles of Facial Expression: None, normal Lips and Perioral Area: None, normal Jaw: None,  normal Tongue: None, normal,Extremity Movements Upper (arms, wrists, hands, fingers): None, normal Lower (legs, knees, ankles, toes): None, normal, Trunk Movements Neck, shoulders, hips: None, normal, Overall Severity Severity of abnormal movements (highest score from questions above): None, normal Incapacitation due to abnormal movements: None, normal Patient's awareness of abnormal movements (rate only patient's report): No Awareness, Dental Status Current problems with teeth and/or dentures?: No Does patient usually wear dentures?: No  CIWA:  CIWA-Ar Total: 0 COWS:     Treatment Plan Summary: Daily contact with patient to assess and evaluate symptoms and progress in treatment Medication management  Plan: Review of chart, vital signs, medications, and notes.  1-Individual and group therapy  2-Medication management for depression and anxiety: Medications reviewed with the patient and she stated no untoward effects. Continue Equetro 200 mg BID for improved mood stability, Prozac 40 mg daily for depression, Increase Prolixin to 5 mg bid for psychosis, Change Cogentin to 0.5 mg bid for EPS prevention.  3-Coping skills for depression, anxiety  4-Continue crisis stabilization and management  5-Address health issues--monitoring vital signs, stable. Continue Metformin 1,000 mg BID for Type 2 Diabetes with daily glucose check.   6-Treatment plan in progress to prevent relapse of depression and anxiety  Medical Decision Making Problem Points:  Established problem,  worsening (2), Review of last therapy session (1) and Review of psycho-social stressors (1) Data Points:  Review or order clinical lab tests (1) Review of medication regiment & side effects (2) Review of new medications or change in dosage (2)  I certify that inpatient services furnished can reasonably be expected to improve the patient's condition.   Fransisca KaufmannLaura Davis, NP-C 09/02/2013, 11:16 AM Patient seen, evaluated and I agree with notes by Nurse Practitioner. Thedore MinsMojeed Yarexi Pawlicki, MD

## 2013-09-02 NOTE — [facility] Group Notes (Signed)
[facility]  09/02/2013 12:57 PM  Type of Therapy:  Group Therapy  Participation Level:  Did Not Attend    Raye SorrowHannah N Jamiesha Victoria 09/02/2013, 12:57 PM

## 2013-09-03 LAB — GLUCOSE, CAPILLARY
GLUCOSE-CAPILLARY: 134 mg/dL — AB (ref 70–99)
Glucose-Capillary: 132 mg/dL — ABNORMAL HIGH (ref 70–99)

## 2013-09-03 NOTE — Progress Notes (Signed)
Patient ID: Joshua BreachShianne Franco, male   DOB: 10/25/1990, 23 y.o.   MRN: [id number] [facility]  09/03/2013 11:28 AM Joshua BreachShianne Brandt  MRN:  [id number] Subjective:   Patient states "I am still depressed and anxious. I think about killing myself. My day program gets on my nerves. I have problems with my family. The medicine is helping to slow down my racing thoughts. My mother may be visiting today. That makes me a little more anxious. I am starting to hear less voices now."   Objective:  Lucretia KernShianne appears to be more active on the unit today. He is easier to engage in conversation today. Patient reports decrease symptoms of anxiety, depression, and psychosis. His Prolixin was increased yesterday to address voices that he reports tell him to harm himself. Nursing staff report that he has been interacting more with other on the unit. Due to his cognitive impairment, the patient often interacts in a childlike manner. Today he talked about feeling suicidal but at the same time appeared to be laughing about it. Patient is compliant with his scheduled medications and denies adverse effects. Lucretia KernShianne will be going back to his previous group home upon discharge per social worker and will [facility] [place].   Diagnosis:   DSM5: Depressive Disorders: Major Depressive Disorder - with Psychotic Features (296.24) Total Time spent with patient: 20 minutes Axis I: Bipolar, Depressed Axis II: Deferred Axis III:  Past Medical History  Diagnosis Date  . Diabetes mellitus without complication   . Bipolar 1 disorder   . PTSD (post-traumatic stress disorder)   . MR (mental retardation)   . Obesity   . GERD (gastroesophageal reflux disease)   . Hyperlipidemia   . Hyperactive   . HTN (hypertension)   . Anxiety disorder   . Obesity hypoventilation syndrome 05/06/2013  . 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 Depression    Axis IV: other psychosocial or environmental problems and problems related to  social environment Axis V: 31-40 impairment in reality testing  ADL's:  Intact  Sleep: Good  Appetite:  Good  Suicidal Ideation:  Passive SI with plan Homicidal Ideation:  Denies AEB (as evidenced by):  Psychiatric Specialty Exam: Physical Exam  Review of Systems  Constitutional: Negative.   HENT: Negative.   Eyes: Negative.   Respiratory: Negative.   Cardiovascular: Negative.   Gastrointestinal: Negative.   Genitourinary: Negative.   Musculoskeletal: Negative.   Skin: Negative.   Neurological: Negative.   Endo/Heme/Allergies: Negative.   Psychiatric/Behavioral: Positive for depression, suicidal ideas and hallucinations. Negative for memory loss and substance abuse. The patient is nervous/anxious and has insomnia.     Blood pressure 113/81, pulse 72, temperature 97.2 F (36.2 C), temperature source Oral, resp. rate 20, height 5\' 7"  (1.702 m), weight 114.306 kg (252 lb), SpO2 97.00%.Body mass index is 39.46 kg/(m^2).  General Appearance: Casual  Eye Contact::  Fair  Speech:  Pressured  Volume:  Normal  Mood:  Dysphoric  Affect:  Appropriate and Blunt  Thought Process:  Circumstantial and Disorganized  Orientation:  Full (Time, Place, and Person)  Thought Content:  Hallucinations: Auditory and Paranoid Ideation  Suicidal Thoughts:  Yes.  with intent/plan  Homicidal Thoughts:  No  Memory:  Immediate;   Fair Recent;   Fair Remote;   Fair  Judgement:  Impaired  Insight:  Lacking  Psychomotor Activity:  Increased  Concentration:  Good  Recall:  Fair  Fund of Knowledge:Fair  Language: Fair  Akathisia:  NA  Handed:  AIMS (if indicated):     Assets:  Desire for Improvement Physical Health Resilience Social Support  Sleep:  Number of Hours: 6.5   Musculoskeletal: Strength & Muscle Tone: within normal limits Gait & Station: normal Patient leans: N/A  Current Medications: Current Facility-Administered Medications  Medication Dose Route Frequency Provider  Last Rate Last Dose  . acetaminophen (TYLENOL) tablet 650 mg  650 mg Oral Q6H PRN Nanine MeansJamison Lord, NP      . alum & mag hydroxide-simeth (MAALOX/MYLANTA) 200-200-20 MG/5ML suspension 30 mL  30 mL Oral Q4H PRN Nanine MeansJamison Lord, NP      . benztropine (COGENTIN) tablet 0.5 mg  0.5 mg Oral BID Fransisca KaufmannLaura Davis, NP   0.5 mg at 09/03/13 0840  . carbamazepine (EQUETRO) 12 hr capsule 200 mg  200 mg Oral BID Nanine MeansJamison Lord, NP   200 mg at 09/03/13 0839  . carvedilol (COREG) tablet 6.25 mg  6.25 mg Oral BID WC Nanine MeansJamison Lord, NP   6.25 mg at 09/03/13 0839  . fenofibrate tablet 160 mg  160 mg Oral Daily Nanine MeansJamison Lord, NP   160 mg at 09/03/13 0839  . FLUoxetine (PROZAC) capsule 40 mg  40 mg Oral Daily Nanine MeansJamison Lord, NP   40 mg at 09/03/13 0839  . fluPHENAZine (PROLIXIN) tablet 5 mg  5 mg Oral BID AC Fransisca KaufmannLaura Davis, NP   5 mg at 09/03/13 [id number]  . hydrOXYzine (ATARAX/VISTARIL) tablet 25 mg  25 mg Oral Q6H PRN Beau FannyJohn C Withrow, FNP      . magnesium hydroxide (MILK OF MAGNESIA) suspension 30 mL  30 mL Oral Daily PRN Nanine MeansJamison Lord, NP      . metFORMIN (GLUCOPHAGE) tablet 1,000 mg  1,000 mg Oral BID WC Nanine MeansJamison Lord, NP   1,000 mg at 09/03/13 0839  . nicotine (NICODERM CQ - dosed in mg/24 hours) patch 21 mg  21 mg Transdermal Daily Zeeva Courser   21 mg at 09/03/13 0838  . pantoprazole (PROTONIX) EC tablet 20 mg  20 mg Oral BID Nanine MeansJamison Lord, NP   20 mg at 09/03/13 0839  . simvastatin (ZOCOR) tablet 20 mg  20 mg Oral q1800 Nanine MeansJamison Lord, NP   20 mg at 09/02/13 1707  . traZODone (DESYREL) tablet 50 mg  50 mg Oral QHS PRN Roseanna Koplin   50 mg at 09/01/13 2123    Lab Results:  Results for orders placed during the hospital encounter of 08/30/13 (from the past 48 hour(s))  GLUCOSE, CAPILLARY     Status: Abnormal   Collection Time    09/01/13  4:58 PM      Result Value Ref Range   Glucose-Capillary 106 (*) 70 - 99 mg/dL   Comment 1 Documented in Chart     Comment 2 Notify RN    GLUCOSE, CAPILLARY     Status: None   Collection Time     09/02/13  6:16 AM      Result Value Ref Range   Glucose-Capillary 99  70 - 99 mg/dL   Comment 1 Notify RN    GLUCOSE, CAPILLARY     Status: Abnormal   Collection Time    09/02/13  4:40 PM      Result Value Ref Range   Glucose-Capillary 145 (*) 70 - 99 mg/dL   Comment 1 Documented in Chart     Comment 2 Notify RN    GLUCOSE, CAPILLARY     Status: Abnormal   Collection Time    09/03/13  6:21 AM  Result Value Ref Range   Glucose-Capillary 134 (*) 70 - 99 mg/dL    Physical Findings: AIMS: Facial and Oral Movements Muscles of Facial Expression: None, normal Lips and Perioral Area: None, normal Jaw: None, normal Tongue: None, normal,Extremity Movements Upper (arms, wrists, hands, fingers): None, normal Lower (legs, knees, ankles, toes): None, normal, Trunk Movements Neck, shoulders, hips: None, normal, Overall Severity Severity of abnormal movements (highest score from questions above): None, normal Incapacitation due to abnormal movements: None, normal Patient's awareness of abnormal movements (rate only patient's report): No Awareness, Dental Status Current problems with teeth and/or dentures?: No Does patient usually wear dentures?: No  CIWA:  CIWA-Ar Total: 0 COWS:     Treatment Plan Summary: Daily contact with patient to assess and evaluate symptoms and progress in treatment Medication management  Plan: Review of chart, vital signs, medications, and notes.  1-Individual and group therapy  2-Medication management for depression and anxiety: Medications reviewed with the patient and she stated no untoward effects. Continue Equetro 200 mg BID for improved mood stability, Prozac 40 mg daily for depression,  Prolixin 5 mg bid for psychosis,  Cogentin to 0.5 mg bid for EPS prevention.  3-Coping skills for depression, anxiety  4-Continue crisis stabilization and management  5-Address health issues--monitoring vital signs, stable. Continue Metformin 1,000 mg BID for Type 2  Diabetes with daily glucose check.   6-Treatment plan in progress to prevent relapse of depression and anxiety  Medical Decision Making Problem Points:  Established problem, stable/improving (1), Review of last therapy session (1) and Review of psycho-social stressors (1) Data Points:  Review or order clinical lab tests (1) Review of medication regiment & side effects (2) Review of new medications or change in dosage (2)  I certify that inpatient services furnished can reasonably be expected to improve the patient's condition.   Fransisca Kaufmann, NP-C 09/03/2013, 11:28 AM  Patient seen, evaluated and I agree with notes by Nurse Practitioner. Thedore Mins, MD

## 2013-09-03 NOTE — [facility] Group Notes (Signed)
[facility]   09/03/2013 9:47 AM  Participation Quality:  Attentive and Sharing  Mood/Affect:  Depressed and flat  Depression Rating:  Did not rate  Anxiety Rating:  Did not rate  Thoughts of Suicide:  Yes  Reporting mother has called, which is a trigger causing the SI Will you contract for safety?   Yes  Current AVH:  No  Plan for Discharge/Comments:  Joshua KernShianne reports he will follow back up with his current provider [facility] in [place]. He shares he will also go back to his group home.  Joshua Franco was quiet, but flat in group.   Transportation Means: group home  Supports:  Family and group home.  Joshua SorrowHannah N Rhenda Franco

## 2013-09-03 NOTE — Progress Notes (Signed)
D: Pt presents with flat affect and depressed mood. Pt reports hearing voices telling him to kill himself. Pt reports passive SI. Pt contracts for safety at this time. Pt has minimal interaction on the unit. Per pt, his symptoms are starting to decrease but he continues to feel depressed d/t auditory hallucinations. A: Medications administered as ordered per MD. Verbal support given. Pt encouraged to attend groups. 15 minute checks performed for safety. R: Pt safety maintained at this time.
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
## 2013-09-03 NOTE — Progress Notes (Signed)
The focus of this group is to help patients review their daily goal of treatment and discuss progress on daily workbooks. Pt attended the evening group and responded to all discussion prompts from the Writer. Pt shared that today was a good day on the unit, the highlight of which was going to the gym this morning to play volleyball. Pt reported having no additional needs from Nursing Staff this evening beyond his medication. Pt shared that he planned to stay on his medications and move [place] upon discharge in order to stay well. Pt's affect was appropriate.

## 2013-09-03 NOTE — Progress Notes (Signed)
Patient ID: Joshua BreachShianne Franco, male   DOB: 10/10/1990, 23 y.o.   MRN: [id number] D: Pt. Visible on unit in the hallway and dayroom. Pt. Exhibits signs of attention seeking, but can be easily appeased. Pt. Reports "voices off and on" telling him to harm himself. A: Writer introduces self to client and encourages him to contact staff if voices propose an actual plan to hurt self. Writer reviewed medication administration schedule. Staff will monitor q715min for safety. R: pt. Contracts for safety. Pt. Is safe on the unit, attends group.

## 2013-09-04 DIAGNOSIS — F4310 Post-traumatic stress disorder, unspecified: Secondary | ICD-10-CM

## 2013-09-04 DIAGNOSIS — F411 Generalized anxiety disorder: Secondary | ICD-10-CM

## 2013-09-04 LAB — GLUCOSE, CAPILLARY
GLUCOSE-CAPILLARY: 139 mg/dL — AB (ref 70–99)
Glucose-Capillary: 84 mg/dL (ref 70–99)
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
## 2013-09-04 NOTE — Progress Notes (Signed)
Patient ID: Joshua BreachShianne Buescher, male   DOB: 07/11/1990, 23 y.o.   MRN: [id number] [facility]  09/04/2013 11:01 AM Joshua BreachShianne Schellinger  MRN:  [id number] Subjective:   Patient states "I am still hearing voices but not feeling like killing myself anymore. I want to go back to my group home.''  Objective:  Patient is seen and chart is reviewed. Patient endorsed decreased anxiety, psychosis, paranoia and depressive symptoms. He rates his anxiety and depression as  Between 4-5/10. He denies suicidal/homicidal ideation, intent or plan today. He has been sociable on the unit interacting with peers and staffs. He is also compliant with his medications and has not verbalized any adverse reactions so far. Diagnosis:   DSM5: Depressive Disorders: Major Depressive Disorder - with Psychotic Features (296.24) Total Time spent with patient: 20 minutes  Axis I: Bipolar 1 Disorder recent episode Depressed           PTSD           Anxiety disorder, NOS Axis II: MR (mental retardation) Axis III:  Past Medical History  Diagnosis Date  . Diabetes mellitus without complication   . Obesity   . GERD (gastroesophageal reflux disease)   . Hyperlipidemia   . Hyperactive   . HTN (hypertension)   . Obesity hypoventilation syndrome 05/06/2013  . 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   Axis IV: other psychosocial or environmental problems and problems related to social environment Axis V: 50-60 Moderate symptoms  ADL's:  Intact  Sleep: Good  Appetite:  Good  Suicidal Ideation:  Passive SI with plan Homicidal Ideation:  Denies AEB (as evidenced by):  Psychiatric Specialty Exam: Physical Exam  Psychiatric: His speech is normal and behavior is normal. Thought content normal. His mood appears anxious. Cognition and memory are normal. He expresses impulsivity.    Review of Systems  Constitutional: Negative.   HENT: Negative.   Eyes: Negative.   Respiratory: Negative.   Cardiovascular: Negative.    Gastrointestinal: Negative.   Genitourinary: Negative.   Musculoskeletal: Negative.   Skin: Negative.   Neurological: Negative.   Endo/Heme/Allergies: Negative.   Psychiatric/Behavioral: Positive for hallucinations. Negative for memory loss and substance abuse. The patient is nervous/anxious.     Blood pressure 120/81, pulse 71, temperature 97.4 F (36.3 C), temperature source Oral, resp. rate 20, height 5\' 7"  (1.702 m), weight 114.306 kg (252 lb), SpO2 97.00%.Body mass index is 39.46 kg/(m^2).  General Appearance: fairly groomed  Patent attorneyye Contact::  good  Speech:  Pressured  Volume:  Normal  Mood:  Dysphoric  Affect:  bright  Thought Process:  Circumstantial   Orientation:  Full (Time, Place, and Person)  Thought Content:  Hallucinations: Auditory and Paranoid Ideation  Suicidal Thoughts:  denies  Homicidal Thoughts:  No  Memory:  Immediate;   Fair Recent;   Fair Remote;   Fair  Judgement:  Impaired  Insight:  Lacking  Psychomotor Activity:  Increased  Concentration:  Good  Recall:  Good  Fund of Knowledge:Fair  Language: Fair  Akathisia:  NA  Handed:    AIMS (if indicated):     Assets:  Desire for Improvement Resilience  Sleep:  Number of Hours: 6.75   Musculoskeletal: Strength & Muscle Tone: within normal limits Gait & Station: normal Patient leans: N/A  Current Medications: Current Facility-Administered Medications  Medication Dose Route Frequency Provider Last Rate Last Dose  . acetaminophen (TYLENOL) tablet 650 mg  650 mg Oral Q6H PRN Nanine MeansJamison Lord, NP      . alum &  mag hydroxide-simeth (MAALOX/MYLANTA) 200-200-20 MG/5ML suspension 30 mL  30 mL Oral Q4H PRN Nanine MeansJamison Lord, NP      . benztropine (COGENTIN) tablet 0.5 mg  0.5 mg Oral BID Fransisca KaufmannLaura Davis, NP   0.5 mg at 09/04/13 0851  . carbamazepine (EQUETRO) 12 hr capsule 200 mg  200 mg Oral BID Nanine MeansJamison Lord, NP   200 mg at 09/04/13 0850  . carvedilol (COREG) tablet 6.25 mg  6.25 mg Oral BID WC Nanine MeansJamison Lord, NP   6.25 mg  at 09/04/13 0851  . fenofibrate tablet 160 mg  160 mg Oral Daily Nanine MeansJamison Lord, NP   160 mg at 09/04/13 0850  . FLUoxetine (PROZAC) capsule 40 mg  40 mg Oral Daily Nanine MeansJamison Lord, NP   40 mg at 09/04/13 0851  . fluPHENAZine (PROLIXIN) tablet 5 mg  5 mg Oral BID AC Fransisca KaufmannLaura Davis, NP   5 mg at 09/04/13 [id number]  . hydrOXYzine (ATARAX/VISTARIL) tablet 25 mg  25 mg Oral Q6H PRN Beau FannyJohn C Withrow, FNP      . magnesium hydroxide (MILK OF MAGNESIA) suspension 30 mL  30 mL Oral Daily PRN Nanine MeansJamison Lord, NP      . metFORMIN (GLUCOPHAGE) tablet 1,000 mg  1,000 mg Oral BID WC Nanine MeansJamison Lord, NP   1,000 mg at 09/04/13 0850  . nicotine (NICODERM CQ - dosed in mg/24 hours) patch 21 mg  21 mg Transdermal Daily Luanna Weesner   21 mg at 09/04/13 0853  . pantoprazole (PROTONIX) EC tablet 20 mg  20 mg Oral BID Nanine MeansJamison Lord, NP   20 mg at 09/04/13 0850  . simvastatin (ZOCOR) tablet 20 mg  20 mg Oral q1800 Nanine MeansJamison Lord, NP   20 mg at 09/03/13 1702  . traZODone (DESYREL) tablet 50 mg  50 mg Oral QHS PRN Rheda Kassab   50 mg at 09/03/13 2120    Lab Results:  Results for orders placed during the hospital encounter of 08/30/13 (from the past 48 hour(s))  GLUCOSE, CAPILLARY     Status: Abnormal   Collection Time    09/02/13  4:40 PM      Result Value Ref Range   Glucose-Capillary 145 (*) 70 - 99 mg/dL   Comment 1 Documented in Chart     Comment 2 Notify RN    GLUCOSE, CAPILLARY     Status: Abnormal   Collection Time    09/03/13  6:21 AM      Result Value Ref Range   Glucose-Capillary 134 (*) 70 - 99 mg/dL  GLUCOSE, CAPILLARY     Status: Abnormal   Collection Time    09/03/13  5:03 PM      Result Value Ref Range   Glucose-Capillary 132 (*) 70 - 99 mg/dL   Comment 1 Notify RN    GLUCOSE, CAPILLARY     Status: None   Collection Time    09/04/13  6:18 AM      Result Value Ref Range   Glucose-Capillary 84  70 - 99 mg/dL   Comment 1 Notify RN      Physical Findings: AIMS: Facial and Oral Movements Muscles of Facial  Expression: None, normal Lips and Perioral Area: None, normal Jaw: None, normal Tongue: None, normal,Extremity Movements Upper (arms, wrists, hands, fingers): None, normal Lower (legs, knees, ankles, toes): None, normal, Trunk Movements Neck, shoulders, hips: None, normal, Overall Severity Severity of abnormal movements (highest score from questions above): None, normal Incapacitation due to abnormal movements: None, normal Patient's awareness of abnormal movements (  rate only patient's report): No Awareness, Dental Status Current problems with teeth and/or dentures?: No Does patient usually wear dentures?: No  CIWA:  CIWA-Ar Total: 0 COWS:     Treatment Plan Summary: Daily contact with patient to assess and evaluate symptoms and progress in treatment Medication management  Plan: Review of chart, vital signs, medications, and notes.  1-Individual and group therapy  2-Medication management for depression and anxiety: Medications reviewed with the patient and she stated no untoward effects. - Continue Equetro 200 mg BID for improved mood stability - Prozac 40 mg daily for depression. -Continue Prolixin to 5 mg bid for psychosis. -Continue Cogentin to 0.5 mg bid for EPS prevention.  3-Coping skills for depression, anxiety  4-Continue crisis stabilization and management  5-Address health issues--monitoring vital signs, stable. Continue Metformin 1,000 mg BID for Type 2 Diabetes with daily glucose check.   6-Treatment plan in progress to prevent relapse of depression and anxiety 7-Tegretol level on 09/05/13  Medical Decision Making Problem Points:  Established problem, worsening (2), Review of last therapy session (1) and Review of psycho-social stressors (1) Data Points:  Review or order clinical lab tests (1) Review of medication regiment & side effects (2) Review of new medications or change in dosage (2)  I certify that inpatient services furnished can reasonably be expected to  improve the patient's condition.   Durwood Dittus,MD 09/04/2013, 11:01 AM
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
## 2013-09-05 LAB — CARBAMAZEPINE LEVEL, TOTAL: CARBAMAZEPINE LVL: 5.6 ug/mL (ref 4.0–12.0)

## 2013-09-05 LAB — GLUCOSE, CAPILLARY: GLUCOSE-CAPILLARY: 111 mg/dL — AB (ref 70–99)

## 2013-09-05 MED ORDER — BENZTROPINE MESYLATE 0.5 MG PO TABS: 0.5000 mg | ORAL_TABLET | Freq: Two times a day (BID) | ORAL | Status: DC

## 2013-09-05 MED ORDER — SIMVASTATIN 20 MG PO TABS: 20.0000 mg | ORAL_TABLET | Freq: Every evening | ORAL | Status: DC

## 2013-09-05 MED ORDER — METFORMIN HCL 500 MG PO TABS: 1000.0000 mg | ORAL_TABLET | Freq: Two times a day (BID) | ORAL | Status: DC

## 2013-09-05 MED ORDER — FLUPHENAZINE HCL 5 MG PO TABS: 5.0000 mg | ORAL_TABLET | Freq: Two times a day (BID) | ORAL | Status: DC

## 2013-09-05 MED ORDER — CARBAMAZEPINE ER 200 MG PO CP12: 200.0000 mg | ORAL_CAPSULE | Freq: Two times a day (BID) | ORAL | Status: DC

## 2013-09-05 MED ORDER — FENOFIBRATE MICRONIZED 134 MG PO CAPS: 134.0000 mg | ORAL_CAPSULE | Freq: Every day | ORAL | Status: DC

## 2013-09-05 MED ORDER — CARVEDILOL 6.25 MG PO TABS: 6.2500 mg | ORAL_TABLET | Freq: Two times a day (BID) | ORAL | Status: DC

## 2013-09-05 MED ORDER — FLUOXETINE HCL 40 MG PO CAPS: 40.0000 mg | ORAL_CAPSULE | Freq: Every day | ORAL | Status: DC

## 2013-09-05 MED ORDER — TRAZODONE HCL 50 MG PO TABS: 50.0000 mg | ORAL_TABLET | Freq: Every evening | ORAL | Status: DC | PRN

## 2013-09-05 MED ORDER — CARBAMAZEPINE ER 100 MG PO CP12
200.0000 mg | ORAL_CAPSULE | Freq: Two times a day (BID) | ORAL | Status: DC
  Filled 2013-09-05: qty 12

## 2013-09-05 MED ORDER — PANTOPRAZOLE SODIUM 20 MG PO TBEC: 20.0000 mg | DELAYED_RELEASE_TABLET | Freq: Two times a day (BID) | ORAL | Status: DC

## 2013-09-05 NOTE — [facility] Suicide Risk Assessment (Signed)
   Demographic Factors:  Male, Low socioeconomic status, Unemployed and lives in a group home  Total Time spent with patient: 20 minutes  Psychiatric Specialty Exam: Physical Exam  Psychiatric: He has a normal mood and affect. His speech is normal and behavior is normal. Judgment and thought content normal. Cognition and memory are normal.    Review of Systems  Constitutional: Negative.   HENT: Negative.   Eyes: Negative.   Respiratory: Negative.   Cardiovascular: Negative.   Gastrointestinal: Negative.   Genitourinary: Negative.   Musculoskeletal: Negative.   Skin: Negative.   Neurological: Negative.   Endo/Heme/Allergies: Negative.   Psychiatric/Behavioral: Negative.     Blood pressure 94/75, pulse 88, temperature 97.4 F (36.3 C), temperature source Oral, resp. rate 20, height 5\' 7"  (1.702 m), weight 114.306 kg (252 lb), SpO2 97.00%.Body mass index is 39.46 kg/(m^2).  General Appearance: Fairly Groomed  Patent attorneyye Contact::  Good  Speech:  Clear and Coherent  Volume:  Normal  Mood:  Euthymic  Affect:  Appropriate  Thought Process:  Goal Directed  Orientation:  Full (Time, Place, and Person)  Thought Content:  Negative  Suicidal Thoughts:  No  Homicidal Thoughts:  No  Memory:  Immediate;   Fair Recent;   Fair Remote;   Fair  Judgement:  Fair  Insight:  Fair  Psychomotor Activity:  Normal  Concentration:  Good  Recall:  [employer]Fair  Fund of Knowledge:Fair  Language: Good  Akathisia:  No  Handed:  Right  AIMS (if indicated):     Assets:  Communication Skills Desire for Improvement Physical Health  Sleep:  Number of Hours: 6.75    Musculoskeletal: Strength & Muscle Tone: within normal limits Gait & Station: normal Patient leans: N/A   Mental Status Per Nursing Assessment::   On Admission:  Suicidal ideation indicated by patient  Current Mental Status by Physician: patient denies suicidal ideation, intent or plan  Loss Factors: Financial problems/change in  socioeconomic status  Historical Factors: Impulsivity  Risk Reduction Factors:   Living with another person, especially a relative and Positive social support  Continued Clinical Symptoms:  Resolving mood and psychotic symptoms  Cognitive Features That Contribute To Risk:  Closed-mindedness Polarized thinking    Suicide Risk:  Minimal: No identifiable suicidal ideation.  Patients presenting with no risk factors but with morbid ruminations; may be classified as minimal risk based on the severity of the depressive symptoms  Discharge Diagnoses:   AXIS I:  Bipolar disorder with severe depression              Post traumatic stress disorder AXIS II:  Borderline IQ AXIS III:   Past Medical History  Diagnosis Date  . Diabetes mellitus without complication   . Obesity   . GERD (gastroesophageal reflux disease)   . Hyperlipidemia   . Hyperactive   . HTN (hypertension)   . Obesity hypoventilation syndrome 05/06/2013   AXIS IV:  other psychosocial or environmental problems and problems related to social environment AXIS V:  61-70 mild symptoms  Plan Of Care/Follow-up recommendations:  Activity:  as tolerated Diet:  healthy Tests:  Tegretol level: 5.6 Other:  patient to keep his follow up appointment  Is patient on multiple antipsychotic therapies at discharge:  No   Has Patient had three or more failed trials of antipsychotic monotherapy by history:  No  Recommended Plan for Multiple Antipsychotic Therapies: NA    Thedore MinsMojeed Shantanique Hodo, MD 09/05/2013, 10:10 AM

## 2013-09-05 NOTE — Tx Team (Signed)
Interdisciplinary Treatment Plan Update (Adult)  Date: 09/01/2013  Time Reviewed:  9:45 AM  Progress in Treatment: Attending groups: Yes Participating in groups:  Yes Taking medication as prescribed:  Yes Tolerating medication:  Yes Family/Significant othe contact made: Completed with group home manager Patient understands diagnosis:  Yes Discussing patient identified problems/goals with staff:  Yes Medical problems stabilized or resolved:  Yes Denies suicidal/homicidal ideation: Yes Issues/concerns per patient self-inventory:  Yes Other:  New problem(s) identified: N/A  Discharge Plan or Barriers: patient will return to his group and follow up with [facility] in [place]  Reason for Continuation of Hospitalization: Goals met.  Comments: N/A  Estimated length of stay: DC today.  For review of initial/current patient goals, please see plan of care.  Attendees: Patient:     Family:     Physician:  Dr. Corena Pilgrim 09/05/13  Nursing:   Toniann Fail, RN 09/05/13  Clinical Social Worker:  Vidal Schwalbe, [place] 09/05/13  Other: Marye Round, RN 09/05/13  Other:  Mickel Baas NP 09/05/13  Other:  Cassandria Santee, SW 09/05/13  Other:     Other:    Other:    Other:    Other:    Other:    Other:     Scribe for Treatment Team:   Vidal Schwalbe , 09/05/13 11:16 AM

## 2013-09-05 NOTE — Discharge Summary (Signed)
Physician Discharge Summary Note  Patient:  Joshua Franco is an 23 y.o., male MRN:  [id number] DOB:  11/13/1990 Patient phone:  [phone] (home)  Patient address:   [address] [place] [address],  Total Time spent with patient: 20 minutes  Date of Admission:  08/30/2013 Date of Discharge: 09/05/13  Reason for Admission:  Depression with SI  Discharge Diagnoses: Principal Problem:   Bipolar disorder with severe depression   Psychiatric Specialty Exam: Physical Exam  Psychiatric: He has a normal mood and affect. His speech is normal and behavior is normal. Judgment and thought content normal. Cognition and memory are normal.    Review of Systems  Constitutional: Negative.   HENT: Negative.   Eyes: Negative.   Respiratory: Negative.   Cardiovascular: Negative.   Gastrointestinal: Negative.   Genitourinary: Negative.   Musculoskeletal: Negative.   Skin: Negative.   Neurological: Negative.   Endo/Heme/Allergies: Negative.   Psychiatric/Behavioral: Negative.     Blood pressure 120/81, pulse 71, temperature 97.4 F (36.3 C), temperature source Oral, resp. rate 20, height 5\' 7"  (1.702 m), weight 114.306 kg (252 lb), SpO2 97.00%.Body mass index is 39.46 kg/(m^2).  General Appearance: Fairly Groomed  Patent attorney::  Good  Speech:  Clear and Coherent  Volume:  Normal  Mood:  Euthymic  Affect:  Appropriate  Thought Process:  Goal Directed  Orientation:  Full (Time, Place, and Person)  Thought Content:  Negative  Suicidal Thoughts:  No  Homicidal Thoughts:  No  Memory:  Immediate;   Fair Recent;   Fair Remote;   Fair  Judgement:  Fair  Insight:  Fair  Psychomotor Activity:  Normal  Concentration:  Good  Recall:  [employer] of Knowledge:Fair  Language: Good  Akathisia:  No  Handed:  Right  AIMS (if indicated):     Assets:  Communication Skills Desire for Improvement Physical Health  Sleep:  Number of Hours: 6.75    Past Psychiatric History: See  H&P Diagnosis:  Hospitalizations:  Outpatient Care:  Substance Abuse Care:  Self-Mutilation:  Suicidal Attempts:  Violent Behaviors:   Musculoskeletal: Strength & Muscle Tone: within normal limits Gait & Station: normal Patient leans: N/A  DSM5: AXIS I: Bipolar disorder with severe depression  Post traumatic stress disorder  AXIS II: Borderline IQ  AXIS III:  Past Medical History   Diagnosis  Date   .  Diabetes mellitus without complication    .  Obesity    .  GERD (gastroesophageal reflux disease)    .  Hyperlipidemia    .  Hyperactive    .  HTN (hypertension)    .  Obesity hypoventilation syndrome  05/06/2013   AXIS IV: other psychosocial or environmental problems and problems related to social environment  AXIS V: 61-70 mild symptoms  Level of Care:  OP  Hospital Course:  Joshua Franco is a 23 year old male with his tory of borderline intellectual functioning, Bipolar disorder and PTSD. He is admitted with depression and suicidal ideation with plan. Patient states he has been feeling increasingly depressed and stressed out over the past couple weeks. Patient reports that his girlfriend is pregnant with his daughter and has been having relationship difficulties at his current group home. Patient presents with sadness, anhedonia, and increased feelings of hopelessness with a plan to drink gasoline, set himself on fire or to jump in front of a moving car. Patient noted to have some superficial self inflicted scratches to left forearm. He endorsed mood swings, emotional  instability but denies psychosis or delusional thinking. He is requesting for his medications to be adjusted because he does not think his current medications are working.  Patient was admitted to the 400 for further assessment and medication management. The majority of his medications from the group home were continued. His Prolixin was increased to 5 mg BID and Prozac was also increased to 40 mg daily. During the  first part of his admission the patient was extremely depressed and suicidal. Joshua Franco began to make progress after his medications were adjusted. Patient then began to express decreased psychosis and suicidal thoughts. Joshua Franco became more active on the unit and had an animated affect. Patient expressed frustration with others in the group home. The patient had a history of threatening suicide in the past. Despite making gestures the patient had not actually harmed himself and would tell staff at group home about his feelings. Patient expressed hope for the future and even talked about findings another girlfriend. He was found stable by the treatment team to [facility] with [facility] [place]. The patient adamantly denied any suicidal or homicidal thoughts prior to discharge. Shiane left [facility] in stable condition with group home staff with all belongings returned to him.   Consults:  psychiatry  Significant Diagnostic Studies:  Admission labs completed   Discharge Vitals:   Blood pressure 120/81, pulse 71, temperature 97.4 F (36.3 C), temperature source Oral, resp. rate 20, height 5\' 7"  (1.702 m), weight 114.306 kg (252 lb), SpO2 97.00%. Body mass index is 39.46 kg/(m^2). Lab Results:   Results for orders placed during the hospital encounter of 08/30/13 (from the past 72 hour(s))  GLUCOSE, CAPILLARY     Status: Abnormal   Collection Time    09/02/13  4:40 PM      Result Value Ref Range   Glucose-Capillary 145 (*) 70 - 99 mg/dL   Comment 1 Documented in Chart     Comment 2 Notify RN    GLUCOSE, CAPILLARY     Status: Abnormal   Collection Time    09/03/13  6:21 AM      Result Value Ref Range   Glucose-Capillary 134 (*) 70 - 99 mg/dL  GLUCOSE, CAPILLARY     Status: Abnormal   Collection Time    09/03/13  5:03 PM      Result Value Ref Range   Glucose-Capillary 132 (*) 70 - 99 mg/dL   Comment 1 Notify RN    GLUCOSE, CAPILLARY     Status: None   Collection  Time    09/04/13  6:18 AM      Result Value Ref Range   Glucose-Capillary 84  70 - 99 mg/dL   Comment 1 Notify RN    GLUCOSE, CAPILLARY     Status: Abnormal   Collection Time    09/04/13 11:55 AM      Result Value Ref Range   Glucose-Capillary 139 (*) 70 - 99 mg/dL  CARBAMAZEPINE LEVEL, TOTAL     Status: None   Collection Time    09/05/13  6:10 AM      Result Value Ref Range   Carbamazepine Lvl 5.6  4.0 - 12.0 ug/mL   Comment: [facility]  GLUCOSE, CAPILLARY     Status: Abnormal   Collection Time    09/05/13  6:14 AM      Result Value Ref Range   Glucose-Capillary 111 (*) 70 - 99 mg/dL  Comment 1 Notify RN      Physical Findings: AIMS: Facial and Oral Movements Muscles of Facial Expression: None, normal Lips and Perioral Area: None, normal Jaw: None, normal Tongue: None, normal,Extremity Movements Upper (arms, wrists, hands, fingers): None, normal Lower (legs, knees, ankles, toes): None, normal, Trunk Movements Neck, shoulders, hips: None, normal, Overall Severity Severity of abnormal movements (highest score from questions above): None, normal Incapacitation due to abnormal movements: None, normal Patient's awareness of abnormal movements (rate only patient's report): No Awareness, Dental Status Current problems with teeth and/or dentures?: No Does patient usually wear dentures?: No  CIWA:  CIWA-Ar Total: 0 COWS:     Psychiatric Specialty Exam: See Psychiatric Specialty Exam and Suicide Risk Assessment completed by Attending Physician prior to discharge.  Discharge destination:  Other:  Group Home  Is patient on multiple antipsychotic therapies at discharge:  No   Has Patient had three or more failed trials of antipsychotic monotherapy by history:  No  Recommended Plan for Multiple Antipsychotic Therapies: NA  Discharge Orders   Future Orders Complete By Expires   Discharge instructions  As directed        Medication List    STOP  taking these medications       diphenhydrAMINE 25 MG tablet  Commonly known as:  BENADRYL     omeprazole 20 MG capsule  Commonly known as:  PRILOSEC      TAKE these medications     Indication   benztropine 0.5 MG tablet  Commonly known as:  COGENTIN  Take 1 tablet (0.5 mg total) by mouth 2 (two) times daily.   Indication:  Extrapyramidal Reaction caused by Medications     carbamazepine 200 MG Cp12 12 hr capsule  Commonly known as:  EQUETRO  Take 1 capsule (200 mg total) by mouth 2 (two) times daily.   Indication:  Manic-Depression     carvedilol 6.25 MG tablet  Commonly known as:  COREG  Take 1 tablet (6.25 mg total) by mouth 2 (two) times daily with a meal.   Indication:  High Blood Pressure of Unknown Cause     fenofibrate micronized 134 MG capsule  Commonly known as:  LOFIBRA  Take 1 capsule (134 mg total) by mouth daily before breakfast.   Indication:  Elevation of Both Cholesterol and Triglycerides in Blood     FLUoxetine 40 MG capsule  Commonly known as:  PROZAC  Take 1 capsule (40 mg total) by mouth daily.   Indication:  Depression, PTSD     fluPHENAZine 5 MG tablet  Commonly known as:  PROLIXIN  Take 1 tablet (5 mg total) by mouth 2 (two) times daily before a meal.   Indication:  Psychosis     metFORMIN 500 MG tablet  Commonly known as:  GLUCOPHAGE  Take 2 tablets (1,000 mg total) by mouth 2 (two) times daily with a meal.   Indication:  Type 2 Diabetes     pantoprazole 20 MG tablet  Commonly known as:  PROTONIX  Take 1 tablet (20 mg total) by mouth 2 (two) times daily.   Indication:  Gastroesophageal Reflux Disease     simvastatin 20 MG tablet  Commonly known as:  ZOCOR  Take 1 tablet (20 mg total) by mouth every evening.   Indication:  Nonfamilial Heterozygous Hypercholesterolemia     traZODone 50 MG tablet  Commonly known as:  DESYREL  Take 1 tablet (50 mg total) by mouth at bedtime as needed for sleep.   Indication:  Trouble Sleeping            Follow-up Information   [facility]  On 09/08/2013. (At 10:30am.  Go see your therapist, Amber, to set up your medication management appointment.  )    Contact information:   [address]  [place] [phone]      Follow-up recommendations:   Activity: as tolerated  Diet: healthy  Tests: Tegretol level: 5.6  Other: patient to keep his follow up appointment   Comments:   Take all your medications as prescribed by your mental healthcare provider.  Report any adverse effects and or reactions from your medicines to your outpatient provider promptly.  Patient is instructed and cautioned to not engage in alcohol and or illegal drug use while on prescription medicines.  In the event of worsening symptoms, patient is instructed to call the crisis hotline, 911 and or go to the nearest ED for appropriate evaluation and treatment of symptoms.  Follow-up with your primary care provider for your other medical issues, concerns and or health care needs.   Total Discharge Time:  Greater than 30 minutes.  Signed: Fransisca KaufmannLaura Davis NP-C 09/05/2013, 9:21 AM  Patient seen, evaluated and I agree with notes by Nurse Practitioner. Thedore MinsMojeed Malin Sambrano, MD

## 2013-09-05 NOTE — Progress Notes (Signed)
Patient discharged per physician order; patient denies SI/HI and A/V hallucinations; patient received samples, prescriptions, and copy of AVS and FL2 by Linsey RN after it was reviewed with him; patient had no other questions or concerns at this time; patient left the unit with group home represenative

## 2013-09-05 NOTE — [facility] Group Notes (Cosign Needed)
[facility]   09/05/2013 10:53 AM  Participation Quality:  Did not attend.  D/C today.  [facility]  Will be picked up by Group home adminstrator, Mellody DanceKeith at 1pm.  Has an appointment with [facility] [place] on Monday, 4/20 @ 10:30am.    Simona Huhina Daimon Kean

## 2013-09-05 NOTE — Progress Notes (Signed)
[facility] :  Will you be returning to the same living situation after discharge: Yes,  [name]  At discharge, do you have transportation home?:Yes,  Group home adminstrator, Dorathy KinsmanKeith  Do you have the ability to pay for your medications:Yes,  MCD/MCR  Release of information consent forms completed and in the chart;  Patient's signature needed at discharge.  Patient to Follow up at: Follow-up Information   [facility]  On 09/08/2013. (At 10:30am.  Go see your therapist, Amber, to set up your medication management appointment.  )    Contact information:   [address]  [place] [phone]      [facility] . (Will provide FL2 for your group home administrator when you discharge today.  )    Contact information:   [address]  [place] [phone]      Patient denies SI/HI:   Yes,  Yes    Safety Planning and Suicide Prevention discussed:  Yes,  Yes  Simona Huhina Ridhima Golberg 09/05/2013, 10:24 AM

## 2013-09-05 NOTE — [facility] Suicide Risk Assessment (Signed)
[facility] INPATIENT:  Family/Significant Other Suicide Prevention Education  Suicide Prevention Education:  Patient Discharged to Other Healthcare Facility:  Suicide Prevention Education Not Provided: {PT. DISCHARGED TO OTHER HEALTHCARE FACILITY:SUICIDE PREVENTION EDUCATION NOT PROVIDED (CHL):  The patient is discharging to another healthcare facility for continuation of treatment.  The patient's medical information, including suicide ideations and risk factors, are a part of the medical information shared with the receiving healthcare facility.  Joshua Franco will be returning to a group home setting for mental health and ALF with around the clock care from providers.   Joshua Franco 09/05/2013, 10:24 AM

## 2013-09-06 ENCOUNTER — Emergency Department (EMERGENCY_DEPARTMENT_HOSPITAL)
Admission: EM | Admit: 2013-09-06 | Discharge: 2013-09-08 | Disposition: A | Discharge status: Other Acute Inpatient Hospital | Payer: Medicare Other | Source: Home / Self Care | Attending: Emergency Medicine | Admitting: Emergency Medicine

## 2013-09-06 ENCOUNTER — Encounter (HOSPITAL_COMMUNITY): Payer: Self-pay | Admitting: Emergency Medicine

## 2013-09-06 DIAGNOSIS — F319 Bipolar disorder, unspecified: Secondary | ICD-10-CM

## 2013-09-06 DIAGNOSIS — Z79899 Other long term (current) drug therapy: Secondary | ICD-10-CM

## 2013-09-06 DIAGNOSIS — F411 Generalized anxiety disorder: Secondary | ICD-10-CM

## 2013-09-06 DIAGNOSIS — T6592XA Toxic effect of unspecified substance, intentional self-harm, initial encounter: Secondary | ICD-10-CM | POA: Insufficient documentation

## 2013-09-06 DIAGNOSIS — X789XXA Intentional self-harm by unspecified sharp object, initial encounter: Secondary | ICD-10-CM | POA: Insufficient documentation

## 2013-09-06 DIAGNOSIS — E785 Hyperlipidemia, unspecified: Secondary | ICD-10-CM

## 2013-09-06 DIAGNOSIS — E662 Morbid (severe) obesity with alveolar hypoventilation: Secondary | ICD-10-CM | POA: Insufficient documentation

## 2013-09-06 DIAGNOSIS — F314 Bipolar disorder, current episode depressed, severe, without psychotic features: Secondary | ICD-10-CM | POA: Diagnosis present

## 2013-09-06 DIAGNOSIS — E119 Type 2 diabetes mellitus without complications: Secondary | ICD-10-CM | POA: Insufficient documentation

## 2013-09-06 DIAGNOSIS — Z880 Allergy status to penicillin: Secondary | ICD-10-CM

## 2013-09-06 DIAGNOSIS — F4311 Post-traumatic stress disorder, acute: Secondary | ICD-10-CM

## 2013-09-06 DIAGNOSIS — T520X4A Toxic effect of petroleum products, undetermined, initial encounter: Secondary | ICD-10-CM

## 2013-09-06 DIAGNOSIS — IMO0002 Reserved for concepts with insufficient information to code with codable children: Secondary | ICD-10-CM

## 2013-09-06 DIAGNOSIS — F17200 Nicotine dependence, unspecified, uncomplicated: Secondary | ICD-10-CM

## 2013-09-06 DIAGNOSIS — R45851 Suicidal ideations: Secondary | ICD-10-CM

## 2013-09-06 DIAGNOSIS — K219 Gastro-esophageal reflux disease without esophagitis: Secondary | ICD-10-CM | POA: Insufficient documentation

## 2013-09-06 LAB — SALICYLATE LEVEL: Salicylate Lvl: <2 mg/dL — ABNORMAL LOW (ref 2.8–20.0)

## 2013-09-06 LAB — COMPREHENSIVE METABOLIC PANEL
ALT: 30 U/L (ref 0–53)
AST: 20 U/L (ref 0–37)
Albumin: 3.7 g/dL (ref 3.5–5.2)
Alkaline Phosphatase: 55 U/L (ref 39–117)
BUN: 19 mg/dL (ref 6–23)
CALCIUM: 10.1 mg/dL (ref 8.4–10.5)
CO2: 23 meq/L (ref 19–32)
Chloride: 102 mEq/L (ref 96–112)
Creatinine, Ser: 0.83 mg/dL (ref 0.50–1.35)
GFR calc non Af Amer: >90 mL/min (ref >90)
GLUCOSE: 130 mg/dL — AB (ref 70–99)
Potassium: 4.2 mEq/L (ref 3.7–5.3)
SODIUM: 138 meq/L (ref 137–147)
Total Protein: 7 g/dL (ref 6.0–8.3)

## 2013-09-06 LAB — CBC
HCT: 36.7 % — ABNORMAL LOW (ref 39.0–52.0)
HEMOGLOBIN: 12.2 g/dL — AB (ref 13.0–17.0)
MCH: 27.4 pg (ref 26.0–34.0)
MCHC: 33.2 g/dL (ref 30.0–36.0)
MCV: 82.3 fL (ref 78.0–100.0)
Platelets: 476 10*3/uL — ABNORMAL HIGH (ref 150–400)
RBC: 4.46 MIL/uL (ref 4.22–5.81)
RDW: 14.4 % (ref 11.5–15.5)
WBC: 9.3 10*3/uL (ref 4.0–10.5)

## 2013-09-06 LAB — RAPID URINE DRUG SCREEN, HOSP PERFORMED
Amphetamines: NOT DETECTED
BARBITURATES: NOT DETECTED
Benzodiazepines: NOT DETECTED
Cocaine: NOT DETECTED
OPIATES: NOT DETECTED
Tetrahydrocannabinol: NOT DETECTED

## 2013-09-06 LAB — ACETAMINOPHEN LEVEL: Acetaminophen (Tylenol), Serum: <15 ug/mL (ref 10–30)

## 2013-09-06 LAB — ETHANOL: Alcohol, Ethyl (B): <11 mg/dL (ref 0–11)

## 2013-09-06 MED ORDER — TRAZODONE HCL 50 MG PO TABS
50.0000 mg | ORAL_TABLET | Freq: Every evening | ORAL | Status: DC | PRN
  Administered 2013-09-06 – 2013-09-07 (×2): 50 mg via ORAL
  Filled 2013-09-06 (×2): qty 1

## 2013-09-06 MED ORDER — CARVEDILOL 6.25 MG PO TABS
6.2500 mg | ORAL_TABLET | Freq: Two times a day (BID) | ORAL | Status: DC
  Administered 2013-09-07 – 2013-09-08 (×3): 6.25 mg via ORAL
  Filled 2013-09-06 (×5): qty 1

## 2013-09-06 MED ORDER — FLUPHENAZINE HCL 5 MG PO TABS
5.0000 mg | ORAL_TABLET | Freq: Two times a day (BID) | ORAL | Status: DC
  Administered 2013-09-07 – 2013-09-08 (×3): 5 mg via ORAL
  Filled 2013-09-06 (×5): qty 1

## 2013-09-06 MED ORDER — CARBAMAZEPINE ER 200 MG PO CP12
200.0000 mg | ORAL_CAPSULE | Freq: Two times a day (BID) | ORAL | Status: DC
  Administered 2013-09-06 – 2013-09-07 (×3): 200 mg via ORAL
  Filled 2013-09-06 (×5): qty 1

## 2013-09-06 MED ORDER — SIMVASTATIN 20 MG PO TABS
20.0000 mg | ORAL_TABLET | Freq: Every evening | ORAL | Status: DC
  Administered 2013-09-06 – 2013-09-07 (×2): 20 mg via ORAL
  Filled 2013-09-06 (×3): qty 1

## 2013-09-06 MED ORDER — BENZTROPINE MESYLATE 1 MG PO TABS
0.5000 mg | ORAL_TABLET | Freq: Two times a day (BID) | ORAL | Status: DC
  Administered 2013-09-06 – 2013-09-08 (×4): 0.5 mg via ORAL
  Filled 2013-09-06 (×4): qty 1

## 2013-09-06 MED ORDER — METFORMIN HCL 500 MG PO TABS
1000.0000 mg | ORAL_TABLET | Freq: Two times a day (BID) | ORAL | Status: DC
Start: 2013-09-07 — End: 2013-09-08
  Administered 2013-09-07 – 2013-09-08 (×3): 1000 mg via ORAL
  Filled 2013-09-06 (×5): qty 2

## 2013-09-06 MED ORDER — FLUOXETINE HCL 20 MG PO CAPS
40.0000 mg | ORAL_CAPSULE | Freq: Every day | ORAL | Status: DC
  Administered 2013-09-06 – 2013-09-08 (×3): 40 mg via ORAL
  Filled 2013-09-06 (×3): qty 2

## 2013-09-06 MED ORDER — PANTOPRAZOLE SODIUM 20 MG PO TBEC
20.0000 mg | DELAYED_RELEASE_TABLET | Freq: Two times a day (BID) | ORAL | Status: DC
  Administered 2013-09-06 – 2013-09-08 (×4): 20 mg via ORAL
  Filled 2013-09-06 (×5): qty 1
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
## 2013-09-07 ENCOUNTER — Encounter (HOSPITAL_COMMUNITY): Payer: Self-pay | Admitting: Registered Nurse

## 2013-09-07 DIAGNOSIS — R45851 Suicidal ideations: Secondary | ICD-10-CM

## 2013-09-07 DIAGNOSIS — F3130 Bipolar disorder, current episode depressed, mild or moderate severity, unspecified: Secondary | ICD-10-CM

## 2013-09-07 LAB — CBG MONITORING, ED: Glucose-Capillary: 97 mg/dL (ref 70–99)
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
## 2013-09-07 NOTE — ED Notes (Signed)
Watching tv in activity room 

## 2013-09-07 NOTE — ED Notes (Signed)
sleeping

## 2013-09-07 NOTE — ED Notes (Signed)
Eating breakfast 

## 2013-09-07 NOTE — ED Notes (Signed)
Up tot he bathroom to shower and change scrubs 

## 2013-09-07 NOTE — ED Notes (Signed)
Dr Tenny Crawoss and shuvon NP into see

## 2013-09-07 NOTE — ED Provider Notes (Signed)
Medical screening examination/treatment/procedure(s) were performed by non-physician practitioner and as supervising physician I was immediately available for consultation/collaboration.   EKG Interpretation None        Roselie Cirigliano, MD 09/07/13 2338 
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
## 2013-09-07 NOTE — Progress Notes (Signed)
The following contacted with beds:  [facility] per [facility] beds available and can fax, referral faxed  [facility]- per Christiane HaJonathan can fax referral but need some sort of documentation showing some IQ score or how severe MR along with referral to know how well he would program at their facility.  Will search chart to see if any documentation exists and will fax if found.   Tomi BambergerMariya Savahanna Almendariz Disposition MHT

## 2013-09-08 ENCOUNTER — Inpatient Hospital Stay (HOSPITAL_COMMUNITY)
Admission: AD | Admit: 2013-09-08 | Discharge: 2013-09-19 | DRG: 885 | Disposition: A | Discharge status: Home / Self Care | Payer: Medicare Other | Source: Intra-hospital | Attending: Psychiatry | Admitting: Psychiatry

## 2013-09-08 ENCOUNTER — Encounter (HOSPITAL_COMMUNITY): Payer: Self-pay | Admitting: *Deleted

## 2013-09-08 ENCOUNTER — Encounter (HOSPITAL_COMMUNITY): Payer: Self-pay | Admitting: Psychiatry

## 2013-09-08 DIAGNOSIS — F411 Generalized anxiety disorder: Secondary | ICD-10-CM | POA: Diagnosis present

## 2013-09-08 DIAGNOSIS — F79 Unspecified intellectual disabilities: Secondary | ICD-10-CM | POA: Diagnosis present

## 2013-09-08 DIAGNOSIS — Z833 Family history of diabetes mellitus: Secondary | ICD-10-CM

## 2013-09-08 DIAGNOSIS — E7800 Pure hypercholesterolemia, unspecified: Secondary | ICD-10-CM | POA: Diagnosis present

## 2013-09-08 DIAGNOSIS — I10 Essential (primary) hypertension: Secondary | ICD-10-CM | POA: Diagnosis present

## 2013-09-08 DIAGNOSIS — R45850 Homicidal ideations: Secondary | ICD-10-CM

## 2013-09-08 DIAGNOSIS — F319 Bipolar disorder, unspecified: Secondary | ICD-10-CM | POA: Diagnosis present

## 2013-09-08 DIAGNOSIS — F259 Schizoaffective disorder, unspecified: Principal | ICD-10-CM | POA: Diagnosis present

## 2013-09-08 DIAGNOSIS — K219 Gastro-esophageal reflux disease without esophagitis: Secondary | ICD-10-CM | POA: Diagnosis present

## 2013-09-08 DIAGNOSIS — R45851 Suicidal ideations: Secondary | ICD-10-CM

## 2013-09-08 DIAGNOSIS — F17200 Nicotine dependence, unspecified, uncomplicated: Secondary | ICD-10-CM | POA: Diagnosis present

## 2013-09-08 DIAGNOSIS — E785 Hyperlipidemia, unspecified: Secondary | ICD-10-CM | POA: Diagnosis present

## 2013-09-08 DIAGNOSIS — E119 Type 2 diabetes mellitus without complications: Secondary | ICD-10-CM | POA: Diagnosis present

## 2013-09-08 DIAGNOSIS — F4311 Post-traumatic stress disorder, acute: Secondary | ICD-10-CM

## 2013-09-08 DIAGNOSIS — Z79899 Other long term (current) drug therapy: Secondary | ICD-10-CM

## 2013-09-08 DIAGNOSIS — F4310 Post-traumatic stress disorder, unspecified: Secondary | ICD-10-CM | POA: Diagnosis present

## 2013-09-08 DIAGNOSIS — F3130 Bipolar disorder, current episode depressed, mild or moderate severity, unspecified: Secondary | ICD-10-CM | POA: Diagnosis present

## 2013-09-08 HISTORY — DX: Anxiety disorder, unspecified: F41.9

## 2013-09-08 HISTORY — DX: Schizophrenia, unspecified: F20.9

## 2013-09-08 LAB — GLUCOSE, CAPILLARY: Glucose-Capillary: 114 mg/dL — ABNORMAL HIGH (ref 70–99)

## 2013-09-08 MED ORDER — CARVEDILOL 6.25 MG PO TABS
6.2500 mg | ORAL_TABLET | Freq: Two times a day (BID) | ORAL | Status: DC
  Administered 2013-09-08 – 2013-09-19 (×22): 6.25 mg via ORAL
  Filled 2013-09-08 (×2): qty 1
  Filled 2013-09-08: qty 2
  Filled 2013-09-08 (×8): qty 1
  Filled 2013-09-08: qty 2
  Filled 2013-09-08 (×4): qty 1
  Filled 2013-09-08: qty 2
  Filled 2013-09-08 (×7): qty 1
  Filled 2013-09-08: qty 2
  Filled 2013-09-08 (×5): qty 1

## 2013-09-08 MED ORDER — SIMVASTATIN 20 MG PO TABS
20.0000 mg | ORAL_TABLET | Freq: Every evening | ORAL | Status: DC
  Administered 2013-09-08 – 2013-09-18 (×11): 20 mg via ORAL
  Filled 2013-09-08 (×14): qty 1

## 2013-09-08 MED ORDER — BENZTROPINE MESYLATE 0.5 MG PO TABS
0.5000 mg | ORAL_TABLET | Freq: Two times a day (BID) | ORAL | Status: DC
  Administered 2013-09-08 – 2013-09-15 (×14): 0.5 mg via ORAL
  Filled 2013-09-08 (×20): qty 1

## 2013-09-08 MED ORDER — FLUOXETINE HCL 20 MG PO CAPS
40.0000 mg | ORAL_CAPSULE | Freq: Every day | ORAL | Status: DC
  Administered 2013-09-09 – 2013-09-10 (×2): 40 mg via ORAL
  Filled 2013-09-08 (×3): qty 2

## 2013-09-08 MED ORDER — METFORMIN HCL 500 MG PO TABS
1000.0000 mg | ORAL_TABLET | Freq: Two times a day (BID) | ORAL | Status: DC
  Administered 2013-09-08 – 2013-09-19 (×22): 1000 mg via ORAL
  Filled 2013-09-08 (×30): qty 2

## 2013-09-08 MED ORDER — PANTOPRAZOLE SODIUM 20 MG PO TBEC
20.0000 mg | DELAYED_RELEASE_TABLET | Freq: Two times a day (BID) | ORAL | Status: DC
  Administered 2013-09-08 – 2013-09-19 (×22): 20 mg via ORAL
  Filled 2013-09-08 (×30): qty 1

## 2013-09-08 MED ORDER — TRAZODONE HCL 50 MG PO TABS
50.0000 mg | ORAL_TABLET | Freq: Every evening | ORAL | Status: DC | PRN
  Administered 2013-09-09: 50 mg via ORAL
  Filled 2013-09-08: qty 1

## 2013-09-08 MED ORDER — CARBAMAZEPINE ER 200 MG PO CP12
200.0000 mg | ORAL_CAPSULE | Freq: Two times a day (BID) | ORAL | Status: DC
  Administered 2013-09-08: 200 mg via ORAL
  Filled 2013-09-08 (×2): qty 1

## 2013-09-08 MED ORDER — CARBAMAZEPINE ER 100 MG PO CP12
200.0000 mg | ORAL_CAPSULE | Freq: Two times a day (BID) | ORAL | Status: DC
  Administered 2013-09-08 – 2013-09-10 (×4): 200 mg via ORAL
  Filled 2013-09-08 (×6): qty 2

## 2013-09-08 MED ORDER — FLUPHENAZINE HCL 5 MG PO TABS
5.0000 mg | ORAL_TABLET | Freq: Two times a day (BID) | ORAL | Status: DC
  Administered 2013-09-08 – 2013-09-10 (×4): 5 mg via ORAL
  Filled 2013-09-08 (×9): qty 1
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
## 2013-09-08 NOTE — Plan of Care (Signed)
[facility] Observation Crisis Plan  Reason for Crisis Plan:  Crisis Stabilization   Plan of Care:  Referral for Telepsychiatry/Psychiatric Consult  Family Support:      Current Living Environment: group home     Insurance:   Hospital Account   Name Acct ID Class Status Primary Coverage   Ernst BreachJackson, Adi [id number] BEHAVIORAL HEALTH OBSERVATION Open MEDICARE - MEDICARE PART A AND B        Guarantor Account (for Hospital Account [id number])   Name Relation to Pt Service Area Active? Acct Type   Ernst BreachJackson, Terrie Self [facility] Yes Behavioral Health   Address Phone       [address] [place], [place] [address] [phone])          Coverage Information (for Hospital Account [id number])   1. MEDICARE/MEDICARE PART A AND B   F/O Payor/Plan Precert #   MEDICARE/MEDICARE PART A AND B    Subscriber Subscriber #   Ernst BreachJackson, Alim [id number]   Address Phone   [address] [place], [place] [phone]        2. [place]   F/O Payor/Plan Precert #   [facility]    Subscriber Subscriber #   Ernst BreachJackson, [place] [id number] O   Address Phone   [address] [place], [place] [address] [phone]          Legal Guardian: self    Primary Care Provider:  No primary provider on file.  Current Outpatient Providers:  [facility]  Psychiatrist:     Counselor/Therapist:     Compliant with Medications:  Yes  Additional Information:   Loren RacerKimberly J Ludy Messamore 4/20/20153:21 PM

## 2013-09-08 NOTE — Consult Note (Signed)
[facility] Consult   Reason for Consult:  Suicidal ideation Referring Physician:  EDP  Joshua Franco is an 23 y.o. male. Total Time spent with patient: 15 minutes  Assessment: AXIS I:  Bipolar, Depressed AXIS II:  Deferred AXIS III:   Past Medical History  Diagnosis Date  . Diabetes mellitus without complication   . Bipolar 1 disorder   . PTSD (post-traumatic stress disorder)   . MR (mental retardation)   . Obesity   . GERD (gastroesophageal reflux disease)   . Hyperlipidemia   . Hyperactive   . HTN (hypertension)   . Anxiety disorder   . Obesity hypoventilation syndrome 05/06/2013  . 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 Depression    AXIS IV:  other psychosocial or environmental problems and problems related to social environment AXIS V:  moderate  Plan:  Recommend psychiatric observation unit.  Dr. De Nurse assessed the patient and concurs with the plan.  Subjective:   Joshua Franco is a 23 y.o. male patient admitted with Bipolar, Depressed.  HPI:  Patient lives in a group home and was talking to his mother who would not stop talking about his dad even though it was upsetting him.  His mother lives in a group home also, not the same one.  He told her he was going to kill himself if she didn't stop talking about his dad who died when he was 61.  Denies homicidal ideations and drug use, no hallucinations.  His is calm and appropriate on assessment, superficial scratches to his left forearm.  HPI Elements:   Location:  Bipolar Depressed. Quality:  suicide attempt. Severity:  drank gasoline. Timing:  yesterday. Review of Systems  Constitutional: Negative for chills and diaphoresis.  Gastrointestinal: Negative for nausea, vomiting, abdominal pain, diarrhea and constipation.  Musculoskeletal: Negative.   Neurological: Negative for headaches.  Psychiatric/Behavioral: Positive for depression and suicidal ideas. Negative for hallucinations. The patient is not  nervous/anxious and does not have insomnia.     Past Psychiatric History: Past Medical History  Diagnosis Date  . Diabetes mellitus without complication   . Bipolar 1 disorder   . PTSD (post-traumatic stress disorder)   . MR (mental retardation)   . Obesity   . GERD (gastroesophageal reflux disease)   . Hyperlipidemia   . Hyperactive   . HTN (hypertension)   . Anxiety disorder   . Obesity hypoventilation syndrome 05/06/2013  . 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 Depression     reports that he has been smoking Cigarettes.  He has a 5 pack-year smoking history. He has never used smokeless tobacco. He reports that he does not drink alcohol or use illicit drugs. Family History  Problem Relation Age of Onset  . Diabetes Father   . Diabetes Maternal Grandmother    Family History Substance Abuse: No Family Supports: No Living Arrangements: Other (Comment) ([facility] group home) Can pt return to current living arrangement?: Yes (Confirmed w/ Lanny Hurst there at 06:10 on 04/19.) Abuse/Neglect [facility]) Physical Abuse: Denies Verbal Abuse: Denies Sexual Abuse: Denies Allergies:   Allergies  Allergen Reactions  . Amoxicillin Other (See Comments) and Swelling    Throat swelling  . Penicillins Other (See Comments) and Swelling    Racing heart     ACT Assessment Complete:  Yes:    Educational Status    Risk to Self: Risk to self Suicidal Ideation: Yes-Currently Present Suicidal Intent: Yes-Currently Present Is patient at risk for suicide?: Yes Suicidal Plan?: Yes-Currently Present Specify Current Suicidal Plan:  Cut wrists  Access to Means: Yes Specify Access to Suicidal Means: Sharps (will use screws) anything available. What has been your use of drugs/alcohol within the last 12 months?: Denies use to this clinician. Previous Attempts/Gestures: Yes How many times?: 2 Other Self Harm Risks: Cutting Triggers for Past Attempts: Family contact Intentional Self Injurious Behavior:  Cutting Comment - Self Injurious Behavior: Will cut to relieve stress Family Suicide History: No Recent stressful life event(s): Conflict (Comment) (Conflict with mother) Persecutory voices/beliefs?: Yes Depression: Yes Depression Symptoms: Despondent;Loss of interest in usual pleasures;Feeling worthless/self pity;Fatigue;Guilt Substance abuse history and/or treatment for substance abuse?: No Suicide prevention information given to non-admitted patients: Not applicable  Risk to Others: Risk to Others Homicidal Ideation: Yes-Currently Present Thoughts of Harm to Others: Yes-Currently Present Comment - Thoughts of Harm to Others: Wants to harm mother w/ a gun Current Homicidal Intent: Yes-Currently Present Current Homicidal Plan: Yes-Currently Present Describe Current Homicidal Plan: Get a gun from uncle and kill mother Access to Homicidal Means: No (Claims he knows where uncle keeps gun.) Identified Victim: Mother History of harm to others?: No Assessment of Violence: None Noted Violent Behavior Description: Pt denies getting into fights. Does patient have access to weapons?: No Criminal Charges Pending?: No Does patient have a court date: No  Abuse: Abuse/Neglect Assessment (Assessment to be complete while patient is alone) Physical Abuse: Denies Verbal Abuse: Denies Sexual Abuse: Denies Exploitation of patient/patient's resources: Denies Self-Neglect: Denies  Prior Inpatient Therapy: Prior Inpatient Therapy Prior Inpatient Therapy: Yes Prior Therapy Dates: 08-30-13, Sept. '14 Prior Therapy Facilty/Provider(s): [facility] Reason for Treatment: SI  Prior Outpatient Therapy: Prior Outpatient Therapy Prior Outpatient Therapy: Yes Prior Therapy Dates: Pt unable to remember Prior Therapy Facilty/Provider(s): Unable to remember Reason for Treatment: Unable to remember  Additional Information: Additional Information 1:1 In Past 12 Months?: No CIRT Risk: No Elopement Risk: No Does  patient have medical clearance?: Yes                  Objective: Blood pressure 106/73, pulse 66, temperature 97.5 F (36.4 C), temperature source Oral, resp. rate 16, height 5' 10"  (1.778 m), weight 261 lb (118.389 kg), SpO2 96.00%.Body mass index is 37.45 kg/(m^2). Results for orders placed during the hospital encounter of 09/06/13 (from the past 72 hour(s))  URINE RAPID DRUG SCREEN (HOSP PERFORMED)     Status: None   Collection Time    09/06/13  8:32 PM      Result Value Ref Range   Opiates NONE DETECTED  NONE DETECTED   Cocaine NONE DETECTED  NONE DETECTED   Benzodiazepines NONE DETECTED  NONE DETECTED   Amphetamines NONE DETECTED  NONE DETECTED   Tetrahydrocannabinol NONE DETECTED  NONE DETECTED   Barbiturates NONE DETECTED  NONE DETECTED   Comment:            DRUG SCREEN FOR MEDICAL PURPOSES     ONLY.  IF CONFIRMATION IS NEEDED     FOR ANY PURPOSE, NOTIFY LAB     WITHIN 5 DAYS.                LOWEST DETECTABLE LIMITS     FOR URINE DRUG SCREEN     Drug Class       Cutoff (ng/mL)     Amphetamine      1000     Barbiturate      200     Benzodiazepine   916     Tricyclics  300     Opiates          300     Cocaine          300     THC              50  ACETAMINOPHEN LEVEL     Status: None   Collection Time    09/06/13  9:00 PM      Result Value Ref Range   Acetaminophen (Tylenol), Serum <15.0  10 - 30 ug/mL   Comment:            THERAPEUTIC CONCENTRATIONS VARY     SIGNIFICANTLY. A RANGE OF 10-30     ug/mL MAY BE AN EFFECTIVE     CONCENTRATION FOR MANY PATIENTS.     HOWEVER, SOME ARE BEST TREATED     AT CONCENTRATIONS OUTSIDE THIS     RANGE.     ACETAMINOPHEN CONCENTRATIONS     >150 ug/mL AT 4 HOURS AFTER     INGESTION AND >50 ug/mL AT 12     HOURS AFTER INGESTION ARE     OFTEN ASSOCIATED WITH TOXIC     REACTIONS.  CBC     Status: Abnormal   Collection Time    09/06/13  9:00 PM      Result Value Ref Range   WBC 9.3  4.0 - 10.5 K/uL   RBC 4.46   4.22 - 5.81 MIL/uL   Hemoglobin 12.2 (*) 13.0 - 17.0 g/dL   HCT 36.7 (*) 39.0 - 52.0 %   MCV 82.3  78.0 - 100.0 fL   MCH 27.4  26.0 - 34.0 pg   MCHC 33.2  30.0 - 36.0 g/dL   RDW 14.4  11.5 - 15.5 %   Platelets 476 (*) 150 - 400 K/uL  COMPREHENSIVE METABOLIC PANEL     Status: Abnormal   Collection Time    09/06/13  9:00 PM      Result Value Ref Range   Sodium 138  137 - 147 mEq/L   Potassium 4.2  3.7 - 5.3 mEq/L   Chloride 102  96 - 112 mEq/L   CO2 23  19 - 32 mEq/L   Glucose, Bld 130 (*) 70 - 99 mg/dL   BUN 19  6 - 23 mg/dL   Creatinine, Ser 0.83  0.50 - 1.35 mg/dL   Calcium 10.1  8.4 - 10.5 mg/dL   Total Protein 7.0  6.0 - 8.3 g/dL   Albumin 3.7  3.5 - 5.2 g/dL   AST 20  0 - 37 U/L   ALT 30  0 - 53 U/L   Alkaline Phosphatase 55  39 - 117 U/L   Total Bilirubin <0.2 (*) 0.3 - 1.2 mg/dL   GFR calc non Af Amer >90  >90 mL/min   GFR calc Af Amer >90  >90 mL/min   Comment: (NOTE)     The eGFR has been calculated using the CKD EPI equation.     This calculation has not been validated in all clinical situations.     eGFR's persistently <90 mL/min signify possible Chronic Kidney     Disease.  ETHANOL     Status: None   Collection Time    09/06/13  9:00 PM      Result Value Ref Range   Alcohol, Ethyl (B) <11  0 - 11 mg/dL   Comment:            LOWEST DETECTABLE LIMIT FOR  SERUM ALCOHOL IS 11 mg/dL     FOR MEDICAL PURPOSES ONLY  SALICYLATE LEVEL     Status: Abnormal   Collection Time    09/06/13  9:00 PM      Result Value Ref Range   Salicylate Lvl <2.4 (*) 2.8 - 20.0 mg/dL  CBG MONITORING, ED     Status: None   Collection Time    09/07/13  7:59 AM      Result Value Ref Range   Glucose-Capillary 97  70 - 99 mg/dL   Labs are reviewed no critical levels noted.  Reviewed medications no changes made  Current Facility-Administered Medications  Medication Dose Route Frequency Provider Last Rate Last Dose  . benztropine (COGENTIN) tablet 0.5 mg  0.5 mg Oral BID Tatyana A  Kirichenko, PA-C   0.5 mg at 09/08/13 0948  . carbamazepine (EQUETRO) 12 hr capsule 200 mg  200 mg Oral BID Tatyana A Kirichenko, PA-C   200 mg at 09/08/13 1000  . carvedilol (COREG) tablet 6.25 mg  6.25 mg Oral BID WC Tatyana A Kirichenko, PA-C   6.25 mg at 09/08/13 0759  . FLUoxetine (PROZAC) capsule 40 mg  40 mg Oral Daily Tatyana A Kirichenko, PA-C   40 mg at 09/08/13 0945  . fluPHENAZine (PROLIXIN) tablet 5 mg  5 mg Oral BID AC Tatyana A Kirichenko, PA-C   5 mg at 09/08/13 0759  . metFORMIN (GLUCOPHAGE) tablet 1,000 mg  1,000 mg Oral BID WC Tatyana A Kirichenko, PA-C   1,000 mg at 09/08/13 0759  . pantoprazole (PROTONIX) EC tablet 20 mg  20 mg Oral BID Tatyana A Kirichenko, PA-C   20 mg at 09/08/13 0946  . simvastatin (ZOCOR) tablet 20 mg  20 mg Oral QPM Tatyana A Kirichenko, PA-C   20 mg at 09/07/13 1839  . traZODone (DESYREL) tablet 50 mg  50 mg Oral QHS PRN Tatyana A Kirichenko, PA-C   50 mg at 09/07/13 2155   Current Outpatient Prescriptions  Medication Sig Dispense Refill  . benztropine (COGENTIN) 0.5 MG tablet Take 1 tablet (0.5 mg total) by mouth 2 (two) times daily.  60 tablet  0  . carbamazepine (EQUETRO) 200 MG CP12 12 hr capsule Take 1 capsule (200 mg total) by mouth 2 (two) times daily.  60 each  0  . carvedilol (COREG) 6.25 MG tablet Take 1 tablet (6.25 mg total) by mouth 2 (two) times daily with a meal.      . fenofibrate micronized (LOFIBRA) 134 MG capsule Take 1 capsule (134 mg total) by mouth daily before breakfast.      . FLUoxetine (PROZAC) 40 MG capsule Take 1 capsule (40 mg total) by mouth daily.  30 capsule  0  . fluPHENAZine (PROLIXIN) 5 MG tablet Take 1 tablet (5 mg total) by mouth 2 (two) times daily before a meal.  60 tablet  0  . metFORMIN (GLUCOPHAGE) 500 MG tablet Take 2 tablets (1,000 mg total) by mouth 2 (two) times daily with a meal.      . pantoprazole (PROTONIX) 20 MG tablet Take 1 tablet (20 mg total) by mouth 2 (two) times daily.  60 tablet  0  .  simvastatin (ZOCOR) 20 MG tablet Take 1 tablet (20 mg total) by mouth every evening.  30 tablet    . traZODone (DESYREL) 50 MG tablet Take 1 tablet (50 mg total) by mouth at bedtime as needed for sleep.  30 tablet  0    Psychiatric Specialty Exam:  Blood pressure 106/73, pulse 66, temperature 97.5 F (36.4 C), temperature source Oral, resp. rate 16, height 5' 10"  (1.778 m), weight 261 lb (118.389 kg), SpO2 96.00%.Body mass index is 37.45 kg/(m^2).  General Appearance: Casual  Eye Contact::  Good  Speech:  Clear and Coherent and Normal Rate  Volume:  Normal  Mood:  Depressed  Affect:  Congruent  Thought Process:  Circumstantial  Orientation:  Full (Time, Place, and Person)  Thought Content:  Rumination  Suicidal Thoughts:  Yes.  with intent/plan  Homicidal Thoughts:  No  Memory:  Immediate;   Good Recent;   Good  Judgement:  Poor  Insight:  Lacking and Shallow  Psychomotor Activity:  Normal  Concentration:  Poor  Recall:  Good  Fund of Knowledge:Fair  Language: Fair  Akathisia:  No  Handed:  Right  AIMS (if indicated):     Assets:  Communication Skills Housing  Sleep:      Musculoskeletal: Strength & Muscle Tone: within normal limits Gait & Station: normal Patient leans: N/A  Treatment Plan Summary: Daily contact with patient to assess and evaluate symptoms and progress in treatment Medication management  Disposition:  Admit to inpatient observation unit at [facility].  Waylan Boga, PMH-NP  09/08/2013 10:12 AM I have personally seen the patient and agreed with the findings and involved in the treatment plan. Merian Capron, MD

## 2013-09-08 NOTE — Progress Notes (Signed)
[facility] OBS Admission Note & SRA  09/08/2013 3:58 PM Joshua Franco  MRN:  [id number] Subjective:  Pt reports SI with plan to drink gasoline and cut himself "like I just did on my arm here".  Pt reports hearing voices telling him to hurt himself and hurt others. Expresses SI toward his mom and affirms that he will hurt her if he gets out soon. Pt also states that he is seeing people and strange creatures. Therefore, SI, HI, and AVH are all present. Pt will likely transfer to inpatient if symptoms do not improve.    [name] lives in a group home and was talking to his mother who would not stop talking about his dad even though it was upsetting him. His mother lives in a group home also, not the same one. He told her he was going to kill himself if she didn't stop talking about his dad who died when he was 76. Denies homicidal ideations and drug use, no hallucinations. His is calm and appropriate on assessment, superficial scratches to his left forearm.    Diagnosis:   DSM5: Depressive Disorders: Major Depressive Disorder - with Psychotic Features (296.24) Total Time spent with patient: 20 minutes  Axis I: Bipolar 1 Disorder recent episode Depressed           PTSD           Anxiety disorder, NOS Axis II: MR (mental retardation) Axis III:  Past Medical History  Diagnosis Date  . Diabetes mellitus without complication   . Obesity   . GERD (gastroesophageal reflux disease)   . Hyperlipidemia   . Hyperactive   . HTN (hypertension)   . Obesity hypoventilation syndrome 05/06/2013  . 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   Axis IV: other psychosocial or environmental problems and problems related to social environment Axis V:  Severe symptoms  ADL's:  Intact  Sleep: Good  Appetite:  Good  Suicidal Ideation:  Passive SI with plan Homicidal Ideation:  Denies AEB (as evidenced by):  Psychiatric Specialty Exam: Physical Exam  Psychiatric: His speech is normal and behavior is normal. Thought  content normal. His mood appears anxious. Cognition and memory are normal. He expresses impulsivity.    Review of Systems  Constitutional: Negative.   HENT: Negative.   Eyes: Negative.   Respiratory: Negative.   Cardiovascular: Negative.   Gastrointestinal: Negative.   Genitourinary: Negative.   Musculoskeletal: Negative.   Skin: Negative.   Neurological: Negative.   Endo/Heme/Allergies: Negative.   Psychiatric/Behavioral: Positive for depression, suicidal ideas and hallucinations. Negative for memory loss and substance abuse. The patient is nervous/anxious and has insomnia.     Blood pressure 110/73, pulse 87, temperature 98.1 F (36.7 C), temperature source Oral, resp. rate 20, height 5' 7.32" (1.71 m), weight 117.935 kg (260 lb).Body mass index is 40.33 kg/(m^2).  General Appearance: fairly groomed  Engineer, water::  good  Speech:  Pressured  Volume:  Normal  Mood:  Dysphoric  Affect:  bright  Thought Process:  Circumstantial   Orientation:  Full (Time, Place, and Person)  Thought Content:  Hallucinations: Auditory and Paranoid Ideation and Visual  Suicidal Thoughts:  denies  Homicidal Thoughts:  No  Memory:  Immediate;   Fair Recent;   Fair Remote;   Fair  Judgement:  Impaired  Insight:  Lacking  Psychomotor Activity:  Increased  Concentration:  Good  Recall:  Good  Fund of Knowledge:Fair  Language: Fair  Akathisia:  NA  Handed:    AIMS (if indicated):  Assets:  Desire for Improvement Resilience  Sleep:      Musculoskeletal: Strength & Muscle Tone: within normal limits Gait & Station: normal Patient leans: N/A  Current Medications: Current Facility-Administered Medications  Medication Dose Route Frequency Provider Last Rate Last Dose  . benztropine (COGENTIN) tablet 0.5 mg  0.5 mg Oral BID Waylan Boga, NP      . carbamazepine (EQUETRO) 12 hr capsule 200 mg  200 mg Oral BID Waylan Boga, NP      . carvedilol (COREG) tablet 6.25 mg  6.25 mg Oral BID WC  Waylan Boga, NP      . Derrill Memo ON 09/09/2013] FLUoxetine (PROZAC) capsule 40 mg  40 mg Oral Daily Waylan Boga, NP      . fluPHENAZine (PROLIXIN) tablet 5 mg  5 mg Oral BID AC Waylan Boga, NP      . metFORMIN (GLUCOPHAGE) tablet 1,000 mg  1,000 mg Oral BID WC Waylan Boga, NP      . pantoprazole (PROTONIX) EC tablet 20 mg  20 mg Oral BID Waylan Boga, NP      . simvastatin (ZOCOR) tablet 20 mg  20 mg Oral QPM Waylan Boga, NP      . traZODone (DESYREL) tablet 50 mg  50 mg Oral QHS PRN Waylan Boga, NP        Lab Results:  Results for orders placed during the hospital encounter of 09/06/13 (from the past 48 hour(s))  URINE RAPID DRUG SCREEN (HOSP PERFORMED)     Status: None   Collection Time    09/06/13  8:32 PM      Result Value Ref Range   Opiates NONE DETECTED  NONE DETECTED   Cocaine NONE DETECTED  NONE DETECTED   Benzodiazepines NONE DETECTED  NONE DETECTED   Amphetamines NONE DETECTED  NONE DETECTED   Tetrahydrocannabinol NONE DETECTED  NONE DETECTED   Barbiturates NONE DETECTED  NONE DETECTED   Comment:            DRUG SCREEN FOR MEDICAL PURPOSES     ONLY.  IF CONFIRMATION IS NEEDED     FOR ANY PURPOSE, NOTIFY LAB     WITHIN 5 DAYS.                LOWEST DETECTABLE LIMITS     FOR URINE DRUG SCREEN     Drug Class       Cutoff (ng/mL)     Amphetamine      1000     Barbiturate      200     Benzodiazepine   818     Tricyclics       403     Opiates          300     Cocaine          300     THC              50  ACETAMINOPHEN LEVEL     Status: None   Collection Time    09/06/13  9:00 PM      Result Value Ref Range   Acetaminophen (Tylenol), Serum <15.0  10 - 30 ug/mL   Comment:            THERAPEUTIC CONCENTRATIONS VARY     SIGNIFICANTLY. A RANGE OF 10-30     ug/mL MAY BE AN EFFECTIVE     CONCENTRATION FOR MANY PATIENTS.     HOWEVER, SOME ARE BEST TREATED  AT CONCENTRATIONS OUTSIDE THIS     RANGE.     ACETAMINOPHEN CONCENTRATIONS     >150 ug/mL AT 4 HOURS AFTER      INGESTION AND >50 ug/mL AT 12     HOURS AFTER INGESTION ARE     OFTEN ASSOCIATED WITH TOXIC     REACTIONS.  CBC     Status: Abnormal   Collection Time    09/06/13  9:00 PM      Result Value Ref Range   WBC 9.3  4.0 - 10.5 K/uL   RBC 4.46  4.22 - 5.81 MIL/uL   Hemoglobin 12.2 (*) 13.0 - 17.0 g/dL   HCT 36.7 (*) 39.0 - 52.0 %   MCV 82.3  78.0 - 100.0 fL   MCH 27.4  26.0 - 34.0 pg   MCHC 33.2  30.0 - 36.0 g/dL   RDW 14.4  11.5 - 15.5 %   Platelets 476 (*) 150 - 400 K/uL  COMPREHENSIVE METABOLIC PANEL     Status: Abnormal   Collection Time    09/06/13  9:00 PM      Result Value Ref Range   Sodium 138  137 - 147 mEq/L   Potassium 4.2  3.7 - 5.3 mEq/L   Chloride 102  96 - 112 mEq/L   CO2 23  19 - 32 mEq/L   Glucose, Bld 130 (*) 70 - 99 mg/dL   BUN 19  6 - 23 mg/dL   Creatinine, Ser 0.83  0.50 - 1.35 mg/dL   Calcium 10.1  8.4 - 10.5 mg/dL   Total Protein 7.0  6.0 - 8.3 g/dL   Albumin 3.7  3.5 - 5.2 g/dL   AST 20  0 - 37 U/L   ALT 30  0 - 53 U/L   Alkaline Phosphatase 55  39 - 117 U/L   Total Bilirubin <0.2 (*) 0.3 - 1.2 mg/dL   GFR calc non Af Amer >90  >90 mL/min   GFR calc Af Amer >90  >90 mL/min   Comment: (NOTE)     The eGFR has been calculated using the CKD EPI equation.     This calculation has not been validated in all clinical situations.     eGFR's persistently <90 mL/min signify possible Chronic Kidney     Disease.  ETHANOL     Status: None   Collection Time    09/06/13  9:00 PM      Result Value Ref Range   Alcohol, Ethyl (B) <11  0 - 11 mg/dL   Comment:            LOWEST DETECTABLE LIMIT FOR     SERUM ALCOHOL IS 11 mg/dL     FOR MEDICAL PURPOSES ONLY  SALICYLATE LEVEL     Status: Abnormal   Collection Time    09/06/13  9:00 PM      Result Value Ref Range   Salicylate Lvl <6.7 (*) 2.8 - 20.0 mg/dL  CBG MONITORING, ED     Status: None   Collection Time    09/07/13  7:59 AM      Result Value Ref Range   Glucose-Capillary 97  70 - 99 mg/dL    Physical  Findings: AIMS:  , ,  ,  ,    CIWA:    COWS:     Treatment Plan Summary: Daily contact with patient to assess and evaluate symptoms and progress in treatment Medication management  Plan: Review of chart, vital signs,  medications, and notes.  1-Medication management for depression and anxiety: Medications reviewed with the patient and she stated no untoward effects, unchanged. 2-Coping skills for depression, anxiety  3-Continue crisis stabilization and management  4-Address health issues--monitoring vital signs, stable  5-Treatment plan in progress to prevent relapse of depression and anxiety  Medical Decision Making Problem Points:  Established problem, worsening (2) and Review of psycho-social stressors (1) Data Points:  Review or order clinical lab tests (1) Review of medication regiment & side effects (2) Review of new medications or change in dosage (2)   Suicide Risk Admission Assessment     Nursing information obtained from:  Patient Demographic factors:  Male;Adolescent or young adult;Caucasian Current Mental Status:  Suicidal ideation indicated by patient Loss Factors:  Loss of significant relationship;Financial problems / change in socioeconomic status Historical Factors:  Prior suicide attempts;Family history of suicide;Family history of mental illness or substance abuse Risk Reduction Factors:  Sense of responsibility to family;Religious beliefs about death;Positive social support Total Time spent with patient: Greater than 30 minutes  CLINICAL FACTORS:   Depression:   Aggression Anhedonia Hopelessness Impulsivity More than one psychiatric diagnosis Currently Psychotic Unstable or Poor Therapeutic Relationship Previous Psychiatric Diagnoses and Treatments Medical Diagnoses and Treatments/Surgeries  Psychiatric Specialty Exam: SEE PSE ABOVE  COGNITIVE FEATURES THAT CONTRIBUTE TO RISK:  Polarized thinking    SUICIDE RISK:   Moderate:  Frequent suicidal  ideation with limited intensity, and duration, some specificity in terms of plans, no associated intent, good self-control, limited dysphoria/symptomatology, some risk factors present, and identifiable protective factors, including available and accessible social support.  PLAN OF CARE: SEE ABOVE.   Elyse Jarvis Withrow, FNP-BC 09/08/2013, 4:00 PM

## 2013-09-08 NOTE — Progress Notes (Signed)
Patient ID: Joshua BreachShianne Franco, male   DOB: 03/12/1991, 23 y.o.   MRN: [id number]  Patient silly, restless and walking in the unit. Pt smiled when speaking about the scratches on his left forearm. Pt grinned and states "I just did that so I could stay here again". Pt denies SI/HI. No distress noted at this time.

## 2013-09-08 NOTE — BH Assessment (Signed)
09/08/13  [facility] and per Sudie BaileyBernadine MD will review this am.

## 2013-09-09 DIAGNOSIS — F319 Bipolar disorder, unspecified: Secondary | ICD-10-CM | POA: Diagnosis present

## 2013-09-09 MED ORDER — PNEUMOCOCCAL VAC POLYVALENT 25 MCG/0.5ML IJ INJ
0.5000 mL | INJECTION | INTRAMUSCULAR | Status: AC
  Administered 2013-09-10: 0.5 mL via INTRAMUSCULAR
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
## 2013-09-09 NOTE — ED Provider Notes (Signed)
Medical screening examination/treatment/procedure(s) were performed by non-physician practitioner and as supervising physician I was immediately available for consultation/collaboration.   EKG Interpretation None        Lyanne CoKevin M Eleonore Shippee, MD 09/09/13 [phone]

## 2013-09-09 NOTE — Progress Notes (Addendum)
Patient laughing and joking with staff throughout morning. Patient talked about how much he enjoys being on the [address] Hall[address] on the Adult Unit. When NP came in for a brief assessment, patient pretended to hear voices. Shortly after NP left the room patient resumes laughing and joking with staff. Patient to transfer to the [address] on the Adult Unit.

## 2013-09-09 NOTE — BH Assessment (Signed)
[facility] Assessment Progress Note  Per Claudette Headonrad Withrow, NP, [facility] to the service of Thedore MinsMojeed Akintayo, MD, Rm 403-1.  Pt signed Voluntary Admission and Consent for Treatment.  Barrie Folkawn Placke, RN, pt's Observation Unit nurse, has been notified.  Doylene Canninghomas Shandiin Eisenbeis, MA Triage Specialist
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
## 2013-09-09 NOTE — Progress Notes (Signed)
[facility] Group Notes:  (Nursing/MHT/Case Management/Adjunct)  Date:  09/09/2013  Time:  10:41 PM  Type of Therapy:  Psychoeducational Skills  Participation Level:  Minimal  Participation Quality:  Supportive  Affect:  Depressed  Cognitive:  Disorganized  Insight:  Improving  Engagement in Group:  Off Topic  Modes of Intervention:  Support   Summary of Progress/Problems: Patient seemed depressed when he spoke in group. Patient went off topic several times and had to be redirected to stay on topic.   Joshua LavJohn W Deon Franco 09/09/2013, 10:41 PM

## 2013-09-09 NOTE — Progress Notes (Signed)
Patient states that he has suicidal ideations with a plan to drink gasoline if discharged. He reports no HI or psychosis. Medications administered as ordered.

## 2013-09-10 ENCOUNTER — Encounter (HOSPITAL_COMMUNITY): Payer: Self-pay | Admitting: Psychiatry

## 2013-09-10 DIAGNOSIS — R45851 Suicidal ideations: Secondary | ICD-10-CM

## 2013-09-10 DIAGNOSIS — F4310 Post-traumatic stress disorder, unspecified: Secondary | ICD-10-CM | POA: Diagnosis present

## 2013-09-10 DIAGNOSIS — F259 Schizoaffective disorder, unspecified: Principal | ICD-10-CM | POA: Diagnosis present

## 2013-09-10 MED ORDER — FLUOXETINE HCL 20 MG PO CAPS
20.0000 mg | ORAL_CAPSULE | Freq: Every day | ORAL | Status: DC
  Administered 2013-09-11 – 2013-09-19 (×9): 20 mg via ORAL
  Filled 2013-09-10 (×2): qty 1
  Filled 2013-09-10: qty 3
  Filled 2013-09-10 (×4): qty 1
  Filled 2013-09-10: qty 3
  Filled 2013-09-10 (×6): qty 1

## 2013-09-10 MED ORDER — ARIPIPRAZOLE 5 MG PO TABS
5.0000 mg | ORAL_TABLET | Freq: Two times a day (BID) | ORAL | Status: DC
  Administered 2013-09-10 – 2013-09-15 (×11): 5 mg via ORAL
  Filled 2013-09-10 (×13): qty 1

## 2013-09-10 MED ORDER — PRAZOSIN HCL 2 MG PO CAPS
2.0000 mg | ORAL_CAPSULE | Freq: Every day | ORAL | Status: DC
  Administered 2013-09-10 – 2013-09-18 (×9): 2 mg via ORAL
  Filled 2013-09-10: qty 1
  Filled 2013-09-10: qty 3
  Filled 2013-09-10 (×5): qty 1
  Filled 2013-09-10: qty 3
  Filled 2013-09-10 (×4): qty 1

## 2013-09-10 MED ORDER — IBUPROFEN 200 MG PO TABS
600.0000 mg | ORAL_TABLET | Freq: Four times a day (QID) | ORAL | Status: DC | PRN
  Administered 2013-09-10: 600 mg via ORAL
  Filled 2013-09-10: qty 3

## 2013-09-10 MED ORDER — FENOFIBRATE 160 MG PO TABS
160.0000 mg | ORAL_TABLET | Freq: Every day | ORAL | Status: DC
  Administered 2013-09-10 – 2013-09-19 (×10): 160 mg via ORAL
  Filled 2013-09-10 (×12): qty 1

## 2013-09-10 MED ORDER — ACETAMINOPHEN 325 MG PO TABS
650.0000 mg | ORAL_TABLET | Freq: Four times a day (QID) | ORAL | Status: DC | PRN
  Administered 2013-09-10: 650 mg via ORAL
  Filled 2013-09-10: qty 2

## 2013-09-10 MED ORDER — LITHIUM CARBONATE ER 450 MG PO TBCR
450.0000 mg | EXTENDED_RELEASE_TABLET | Freq: Two times a day (BID) | ORAL | Status: DC
  Administered 2013-09-10 – 2013-09-19 (×19): 450 mg via ORAL
  Filled 2013-09-10 (×7): qty 1
  Filled 2013-09-10: qty 6
  Filled 2013-09-10 (×4): qty 1
  Filled 2013-09-10: qty 6
  Filled 2013-09-10 (×6): qty 1
  Filled 2013-09-10: qty 6
  Filled 2013-09-10 (×3): qty 1
  Filled 2013-09-10: qty 6
  Filled 2013-09-10 (×2): qty 1

## 2013-09-10 NOTE — Progress Notes (Signed)
Patient Discharge Instructions:  Next Level Care Provider Has Access to the EMR, 09/10/13 [facility]  Records are available via [facility]/Epic access.  Jerelene ReddenSheena E , 09/10/2013, 2:12 PM

## 2013-09-10 NOTE — B[facility] Counselor (Signed)
Adult Psychosocial Assessment Update Interdisciplinary Team  Previous Behavior Health Hospital admissions/discharges:  Admissions Discharges  Date: 08/30/13 Date:  Date: 02/12/13 Date:  Date: 02/10/11 Date:  Date: 01/19/11 Date:  Date: 2 in 2010 Date:   Changes since the last Psychosocial Assessment (including adherence to outpatient mental health and/or substance abuse treatment, situational issues contributing to decompensation and/or relapse).  "I talked to my step mother who was doing crack with my 24 YO sister.  I got angry and lost it.  I had suicidal thoughts and drank gasoline.  That's why I am back.  I need a medication change to take care of the voices, depression and my suicidal thoughts."  Plans to return to the group home, that he says is a good place.  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  He is preoccupied with his girlfriend delivering today, though that has not been confirmed.             Discharge Plan 1. Will you be returning to the same living situation after discharge?   Yes:X No:      If no, what is your plan?           2. Would you like a referral for services when you are discharged? Yes:     If yes, for what services?  No:  X  Already open for services            Summary and Recommendations (to be completed by the evaluator) Patient here last week. Stabilize patient here.  [facility]                        Signature:  Ida RogueRodney B Nathaniel Wakeley, 09/10/2013 11:24 AM

## 2013-09-10 NOTE — [facility] Group Notes (Signed)
[facility] Group Therapy  09/10/2013  1:08 PM  Type of Therapy:  Mental [employer] Presentation   Participation Level:  Active  Participation Quality:  Appropriate  Affect:  Appropriate  Cognitive:  Alert  Insight:  Improving  Engagement in Therapy:  Engaged  Modes of Intervention:  Discussion, Education and Socialization   Summary of Progress/Problems:  Onalee HuaDavid from [employer] came to present his recovery story and play the guitar.  Gerell had a wet towel covering his eyes and head.  Tabitha asked about what mental health services and support groups are provided at [facility].  He stated that he was interested in peer support specialist and was provided more information about the certification.  He indicated that he was also interested in Vocational Rehab.  He thanked the speaker for coming to group.    Simona Huhina Yang   09/10/2013  1:08 PM

## 2013-09-10 NOTE — Progress Notes (Signed)
Adult Psychoeducational Group Note  Date:  09/10/2013 Time:  11:00 AM  Group Topic/Focus:  Crisis Planning:   The purpose of this group is to help patients create a crisis plan for use upon discharge or in the future, as needed.  Participation Level:  Active  Participation Quality:  Appropriate  Affect:  Appropriate  Cognitive:  Appropriate  Insight: Appropriate  Engagement in Group:  Engaged  Modes of Intervention:  Discussion  Additional Comments:  Pt attended group this morning. Pt was appropriate and participate in group.
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
## 2013-09-10 NOTE — [facility] Group Notes (Signed)
[facility] LCS[facility] After[facility] Discharge Planning Group Note   09/10/2013 11:19 AM  Participation Quality:  Engaged  Mood/Affect:  Not Congruent  Depression Rating:  10  Anxiety Rating:  0  Thoughts of Suicide:  Yes Will you contract for safety?   Yes  Current AVH:  Yes  Plan for Discharge/Comments:  "I talked to my step mother who was doing crack with my 24 YO sister.  I got angry and lost it.  I had suicidal thoughts and drank gasoline.  That's why I am back.  I need a medication change to take care of the voices, depression and my suicidal thoughts."  Plans to return to the group home, that he says is a good place.  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  He is preoccupied with his girlfriend delivering today, though that has not been confirmed.  Transportation Means: [facility]  Supports: [facility]  Ida Rogueodney B Dermot Gremillion
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
## 2013-09-10 NOTE — Progress Notes (Signed)
Patient ID: Ernst BreachShianne Liggins, male   DOB: 03/29/1991, 23 y.o.   MR[facility] [id number] [facility]  09/10/2013 9:00 AM Ernst BreachShianne Hincapie  MRN:  [id number] Subjective:  Pt continues to report SI, HI, and AVH and [facility].    NWG:NFAOZHYHPI:Patient lives in a group home and was talking to his mother who would not stop talking about his dad even though it was upsetting him. His mother lives in a group home also, not the same one. He told her he was going to kill himself if she didn't stop talking about his dad who died when he was [age]. Denies homicidal ideations and drug use, no hallucinations. His is calm and appropriate on assessment, superficial scratches to his left forearm.    Diagnosis:   DSM5: Depressive Disorders: Major Depressive Disorder - with Psychotic Features (296.24) Total Time spent with patient: 20 minutes  Axis I: Bipolar 1 Disorder recent episode Depressed           PTSD           Anxiety disorder, NOS Axis II: MR (mental retardation) Axis III:  Past Medical History  Diagnosis Date  . Diabetes mellitus without complication   . Obesity   . GERD (gastroesophageal reflux disease)   . Hyperlipidemia   . Hyperactive   . HTN (hypertension)   . Obesity hypoventilation syndrome 05/06/2013  . 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   Axis IV: other psychosocial or environmental problems and problems related to social environment Axis V:  Severe symptoms  ADL's:  Intact  Sleep: Good  Appetite:  Good  Suicidal Ideation:  Passive SI with plan Homicidal Ideation:  Denies AEB (as evidenced by):  Psychiatric Specialty Exam: Physical Exam  Psychiatric: His speech is normal and behavior is normal. Thought content normal. His mood appears anxious. Cognition and memory are normal. He expresses impulsivity.    Review of Systems  Constitutional: Negative.   HENT: Negative.   Eyes: Negative.   Respiratory: Negative.   Cardiovascular: Negative.    Gastrointestinal: Negative.   Genitourinary: Negative.   Musculoskeletal: Negative.   Skin: Negative.   Neurological: Negative.   Endo/Heme/Allergies: Negative.   Psychiatric/Behavioral: Positive for depression, suicidal ideas and hallucinations. Negative for memory loss and substance abuse. The patient is nervous/anxious and has insomnia.     Blood pressure 114/75, pulse 94, temperature 97.6 F (36.4 C), temperature source Oral, resp. rate 20, height 5' 7.32" (1.71 m), weight 117.935 kg (260 lb).Body mass index is 40.33 kg/(m^2).  General Appearance: fairly groomed  Patent attorneyye Contact::  good  Speech:  Pressured  Volume:  Normal  Mood:  Dysphoric  Affect:  bright  Thought Process:  Circumstantial   Orientation:  Full (Time, Place, and Person)  Thought Content:  Hallucinations: Auditory and Paranoid Ideation and Visual  Suicidal Thoughts:  denies  Homicidal Thoughts:  No  Memory:  Immediate;   Fair Recent;   Fair Remote;   Fair  Judgement:  Impaired  Insight:  Lacking  Psychomotor Activity:  Increased  Concentration:  Good  Recall:  Good  Fund of Knowledge:Fair  Language: Fair  Akathisia:  NA  Handed:    AIMS (if indicated):     Assets:  Desire for Improvement Resilience  Sleep:  Number of Hours: 6.75   Musculoskeletal: Strength & Muscle Tone: within normal limits Gait & Station: normal Patient leans: N/A  Current Medications: Current Facility-Administered Medications  Medication Dose Route Frequency Provider Last Rate Last Dose  .  acetaminophen (TYLENOL) tablet 650 mg  650 mg Oral Q6H PRN Fransisca KaufmannLaura Davis, NP   650 mg at 09/10/13 1819  . ARIPiprazole (ABILIFY) tablet 5 mg  5 mg Oral BID PC Mojeed Akintayo   5 mg at 09/10/13 1705  . benztropine (COGENTIN) tablet 0.5 mg  0.5 mg Oral BID Nanine MeansJamison Lord, NP   0.5 mg at 09/10/13 1705  . carvedilol (COREG) tablet 6.25 mg  6.25 mg Oral BID WC Nanine MeansJamison Lord, NP   6.25 mg at 09/10/13 1704  . fenofibrate tablet 160 mg  160 mg Oral Daily  Mojeed Akintayo   160 mg at 09/10/13 1156  . [START ON 09/11/2013] FLUoxetine (PROZAC) capsule 20 mg  20 mg Oral Daily Mojeed Akintayo      . lithium carbonate (ESKALITH) CR tablet 450 mg  450 mg Oral BID PC Mojeed Akintayo   450 mg at 09/10/13 1705  . metFORMIN (GLUCOPHAGE) tablet 1,000 mg  1,000 mg Oral BID WC Nanine MeansJamison Lord, NP   1,000 mg at 09/10/13 1705  . pantoprazole (PROTONIX) EC tablet 20 mg  20 mg Oral BID Nanine MeansJamison Lord, NP   20 mg at 09/10/13 1705  . prazosin (MINIPRESS) capsule 2 mg  2 mg Oral QHS Mojeed Akintayo      . simvastatin (ZOCOR) tablet 20 mg  20 mg Oral QPM Nanine MeansJamison Lord, NP   20 mg at 09/10/13 1705    Lab Results:  No results found for this or any previous visit (from the past 48 hour(s)).  Physical Findings: AIMS: Facial and Oral Movements Muscles of Facial Expression: None, normal Lips and Perioral Area: None, normal Jaw: None, normal Tongue: None, normal,Extremity Movements Upper (arms, wrists, hands, fingers): None, normal Lower (legs, knees, ankles, toes): None, normal, Trunk Movements Neck, shoulders, hips: None, normal, Overall Severity Severity of abnormal movements (highest score from questions above): None, normal Incapacitation due to abnormal movements: None, normal Patient's awareness of abnormal movements (rate only patient's report): No Awareness, Dental Status Current problems with teeth and/or dentures?: No Does patient usually wear dentures?: No  CIWA:    COWS:     Treatment Plan Summary: Daily contact with patient to assess and evaluate symptoms and progress in treatment Medication management  Plan: Review of chart, vital signs, medications, and notes.  1-Medication management for depression and anxiety: Medications reviewed with the patient and she stated no untoward effects, unchanged. 2-Coping skills for depression, anxiety  3-Continue crisis stabilization and management  4-Address health issues--monitoring vital signs, stable   5-Treatment plan in progress to prevent relapse of depression and anxiety  Medical Decision Making Problem Points:  Established problem, worsening (2) and Review of psycho-social stressors (1) Data Points:  Review or order clinical lab tests (1) Review of medication regiment & side effects (2) Review of new medications or change in dosage (2)  Everardo AllJohn C Leiliana Foody, FNP-BC 09/10/2013, 9:00 AM

## 2013-09-10 NOTE — [facility] Suicide Risk Assessment (Signed)
   Nursing information obtained from:  Patient Demographic factors:  Male;Adolescent or young adult;Caucasian Current Mental Status:  Suicidal ideation indicated by patient Loss Factors:  Loss of significant relationship;Financial problems / change in socioeconomic status Historical Factors:  Prior suicide attempts;Family history of suicide;Family history of mental illness or substance abuse Risk Reduction Factors:  Sense of responsibility to family;Religious beliefs about death;Positive social support Total Time spent with patient: 30 minutes  CLINICAL FACTORS:   Severe Anxiety and/or Agitation Bipolar Disorder:   Mixed State Depression:   Anhedonia Delusional Hopelessness Impulsivity Insomnia Obsessive-Compulsive Disorder Currently Psychotic  Psychiatric Specialty Exam: Physical Exam  Psychiatric: His mood appears anxious. His speech is rapid and/or pressured and tangential. He is actively hallucinating. Thought content is paranoid. Cognition and memory are normal. He expresses impulsivity. He exhibits a depressed mood. He expresses suicidal ideation. He expresses suicidal plans.    Review of Systems  Constitutional: Negative.   HENT: Negative.   Eyes: Negative.   Respiratory: Negative.   Cardiovascular: Negative.   Gastrointestinal: Negative.   Genitourinary: Negative.   Musculoskeletal: Negative.   Skin: Negative.   Neurological: Negative.   Endo/Heme/Allergies: Negative.   Psychiatric/Behavioral: Positive for depression, suicidal ideas and hallucinations. The patient is nervous/anxious.     Blood pressure 129/84, pulse 69, temperature 97.1 F (36.2 C), temperature source Oral, resp. rate 20, height 5' 7.32" (1.71 m), weight 117.935 kg (260 lb).Body mass index is 40.33 kg/(m^2).  General Appearance: Fairly Groomed  Patent attorneyye Contact::  Good  Speech:  Pressured  Volume:  Increased  Mood:  Euphoric  Affect:  Full Range  Thought Process:  Circumstantial and Disorganized   Orientation:  Full (Time, Place, and Person)  Thought Content:  Delusions and Hallucinations: Auditory  Suicidal Thoughts:  Yes.  without intent/plan  Homicidal Thoughts:  No  Memory:  Immediate;   Fair Recent;   Fair Remote;   Fair  Judgement:  Poor  Insight:  Lacking  Psychomotor Activity:  Increased  Concentration:  Fair  Recall:  [employer]Fair  Fund of Knowledge:Fair  Language: Fair  Akathisia:  No  Handed:  Right  AIMS (if indicated):     Assets:  Communication Skills Desire for Improvement Physical Health  Sleep:  Number of Hours: 6.75   Musculoskeletal: Strength & Muscle Tone: within normal limits Gait & Station: normal Patient leans: N/A  COGNITIVE FEATURES THAT CONTRIBUTE TO RISK:  Closed-mindedness Polarized thinking    SUICIDE RISK:   Mild:  Suicidal ideation of limited frequency, intensity, duration, and specificity.  There are no identifiable plans, no associated intent, mild dysphoria and related symptoms, good self-control (both objective and subjective assessment), few other risk factors, and identifiable protective factors, including available and accessible social support.  PLAN OF CARE:1. Admit for crisis management and stabilization. 2. Medication management to reduce current symptoms to base line and improve the     patient's overall level of functioning 3. Treat health problems as indicated. 4. Develop treatment plan to decrease risk of relapse upon discharge and the need for     readmission. 5. Psycho-social education regarding relapse prevention and self care. 6. Health care follow up as needed for medical problems. 7. Restart home medications where appropriate.   I certify that inpatient services furnished can reasonably be expected to improve the patient's condition.  Thedore MinsMojeed Kaliegh Willadsen, MD 09/10/2013, 10:00 AM
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
## 2013-09-11 DIAGNOSIS — F4310 Post-traumatic stress disorder, unspecified: Secondary | ICD-10-CM

## 2013-09-11 LAB — T3, FREE: T3, Free: 2.9 pg/mL (ref 2.3–4.2)

## 2013-09-11 LAB — TSH: TSH: 2.68 u[IU]/mL (ref 0.350–4.500)

## 2013-09-11 MED ORDER — IBUPROFEN 200 MG PO TABS
600.0000 mg | ORAL_TABLET | Freq: Four times a day (QID) | ORAL | Status: DC | PRN
  Administered 2013-09-14: 600 mg via ORAL
  Filled 2013-09-11: qty 3

## 2013-09-11 MED ORDER — IBUPROFEN 200 MG PO TABS: 600.0000 mg | ORAL_TABLET | Freq: Three times a day (TID) | ORAL | Status: DC | PRN

## 2013-09-11 NOTE — Progress Notes (Signed)
[facility]  09/11/2013 11:27 AM Joshua BreachShianne Mclear  MRN:  [id number]  Subjective: Patient states "I am hearing voices telling me to drink gasoline and hurt my mother. I still want to drink more gasoline and set myself on fire. I hate the day program I go to. People talk about me behind my back, it makes me feel sad and worthless."   Objective:  Patient is seen and chart is reviewed. Patient continues to report feeling depressed, suicidal, anxious, psychotic and depressed. He is rating his depression and  anxiety at 10/10. He also reports that he was overwhelmed about his mother's constant phone call to his group home telling him about her addiction to cocaine. He believes his medications are not working and has agreed to a trial of Lithium and Abilify. Patient has been compliant with his current medication regimen and has not endorsed any adverse reactions. Per staff reports, patient continues to exhibit attention seeking behavior.   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 Diabetes mellitus without complication   . Bipolar 1 disorder   . PTSD (post-traumatic stress disorder)   . MR (mental retardation)   . Obesity   . GERD (gastroesophageal reflux disease)   . Hyperlipidemia   . Hyperactive   . HTN (hypertension)   . Anxiety disorder   . Obesity hypoventilation syndrome 05/06/2013  . 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 Depression    Axis IV: other  psychosocial or environmental problems, problems related to social environment and problems with primary support group  ADL's:  Intact  Sleep: Fair  Appetite:  Fair  Suicidal Ideation: yes Plan:  to drink gasoline Intent:  denies  Means:  denies Homicidal Ideation:  denies AEB (as evidenced by):  Psychiatric Specialty Exam: Physical Exam  Psychiatric: His mood appears anxious. His speech is rapid and/or pressured and tangential. He is actively hallucinating. Thought content is paranoid. Cognition and memory are normal. He expresses impulsivity. He exhibits a depressed mood. He expresses suicidal ideation. He expresses suicidal plans.    Review of Systems  Constitutional: Negative.   HENT: Negative.   Eyes: Negative.   Respiratory: Negative.   Cardiovascular: Negative.   Gastrointestinal: Negative.   Genitourinary: Negative.   Musculoskeletal: Negative.   Skin: Negative.   Neurological: Negative.   Endo/Heme/Allergies: Negative.   Psychiatric/Behavioral: Positive for depression, suicidal ideas and hallucinations. The patient is nervous/anxious.     Blood pressure 88/54, pulse 88, temperature 97.4 F (36.3 C), temperature source Oral, resp. rate 18, height 5' 7.32" (1.71 m), weight 117.935 kg (260 lb).Body mass index is 40.33 kg/(m^2).  General Appearance: Fairly Groomed  Patent attorneyye Contact::  Good  Speech:  Pressured  Volume:  Increased  Mood:  Euphoric  Affect:  Full Range  Thought Process:  Circumstantial and Disorganized  Orientation:  Full (Time, Place, and Person)  Thought Content:  Delusions and Hallucinations: Auditory  Suicidal Thoughts:  Yes.  with intent/plan  Homicidal Thoughts:  No  Memory:  Immediate;   Fair Recent;   Fair Remote;   Fair  Judgement:  Poor  Insight:  Lacking  Psychomotor Activity:  Increased  Concentration:  Fair  Recall:  [employer] of Knowledge:Fair  Language: Good  Akathisia:  No  Handed:  Right  AIMS (if indicated):     Assets:   Communication Skills Desire for Improvement Physical Health  Sleep:  Number of Hours: 6.75   Musculoskeletal: Strength & Muscle Tone: within normal limits Gait & Station: normal Patient leans: N/A  Current Medications: Current Facility-Administered Medications  Medication Dose Route Frequency Provider Last Rate Last Dose  . acetaminophen (TYLENOL) tablet 650 mg  650 mg Oral Q6H PRN Fransisca Kaufmann, NP   650 mg at 09/10/13 1819  . ARIPiprazole (ABILIFY) tablet 5 mg  5 mg Oral BID PC Joshua Franco   5 mg at 09/11/13 0843  . benztropine (COGENTIN) tablet 0.5 mg  0.5 mg Oral BID Nanine Means, NP   0.5 mg at 09/11/13 0843  . carvedilol (COREG) tablet 6.25 mg  6.25 mg Oral BID WC Nanine Means, NP   6.25 mg at 09/11/13 0843  . fenofibrate tablet 160 mg  160 mg Oral Daily Velma Agnes   160 mg at 09/11/13 0844  . FLUoxetine (PROZAC) capsule 20 mg  20 mg Oral Daily Godfrey Tritschler   20 mg at 09/11/13 0844  . ibuprofen (ADVIL,MOTRIN) tablet 600 mg  600 mg Oral Q6H PRN Kerry Hough, PA-C   600 mg at 09/10/13 2139  . lithium carbonate (ESKALITH) CR tablet 450 mg  450 mg Oral BID PC [place]   450 mg at 09/11/13 0844  . metFORMIN (GLUCOPHAGE) tablet 1,000 mg  1,000 mg Oral BID WC Nanine Means, NP   1,000 mg at 09/11/13 0843  . pantoprazole (PROTONIX) EC tablet 20 mg  20 mg Oral BID Nanine Means, NP   20 mg at 09/11/13 0844  . prazosin (MINIPRESS) capsule 2 mg  2 mg Oral QHS Ashland Osmer   2 mg at 09/10/13 2137  . simvastatin (ZOCOR) tablet 20 mg  20 mg Oral QPM Nanine Means, NP   20 mg at 09/10/13 1705    Lab Results:  Results for orders placed during the hospital encounter of 09/08/13 (from the past 48 hour(s))  TSH     Status: None   Collection Time    09/11/13  6:20 AM      Result Value Ref Range   TSH 2.680  0.350 - 4.500 uIU/mL   Comment: Please note change in reference range.     Performed at [facility]    Physical Findings: AIMS: Facial and Oral  Movements Muscles of Facial Expression: None, normal Lips and Perioral Area: None, normal Jaw: None, normal Tongue: None, normal,Extremity Movements Upper (arms, wrists, hands, fingers): None, normal Lower (legs, knees, ankles, toes): None, normal, Trunk Movements Neck, shoulders, hips: None, normal, Overall Severity Severity of abnormal movements (highest score from questions above): None, normal Incapacitation due to abnormal movements: None, normal Patient's awareness of abnormal movements (rate only patient's report): No Awareness, Dental Status Current problems with teeth and/or dentures?: No Does patient usually wear dentures?: No  CIWA:    COWS:     Treatment Plan Summary: Daily contact with patient to assess and evaluate symptoms and progress in treatment Medication management  Plan:1. Admit for crisis management and stabilization. 2. Medication management to reduce current symptoms to base line and improve the  patient's overall level of functioning: -Continue Abilify 5mg  bid for delusions/psychosis -Continue Lithium CR 450mg  bid  for mood/depression/ recurrent suicidal thoughts -Continue Prozac 20mg  daily for PTSD -Continue Prazosin 2mg  qhs for nightmares/PTSD 3. Treat health problems as indicated. 4. Develop treatment plan to decrease risk of relapse upon discharge and the need for  readmission. 5. Psycho-social education regarding relapse prevention and self care. 6. Health care follow up as needed for medical problems. 7. Restart home medications where appropriate.   Medical Decision Making Problem Points:  Established problem, worsening (2), Review of last therapy session (1) and Review of psycho-social stressors (1) Data Points:  Order Aims Assessment (2) Review or order clinical lab tests (1) Review of medication regiment & side effects (2) Review of new medications or change in dosage (2)  I certify that inpatient services furnished can reasonably be expected  to improve the patient's condition.   Philisha Weinel,MD 09/11/2013, 11:27 AM

## 2013-09-11 NOTE — [facility] Group Notes (Signed)
[facility] Group Notes:  (Counselor/Nursing/MHT/Case Management/Adjunct)  09/11/2013 1:15PM  Type of Therapy:  Group Therapy  Participation Level:  Active  Participation Quality:  Appropriate  Affect:  Flat  Cognitive:  Oriented  Insight:  Improving  Engagement in Group:  Limited  Engagement in Therapy:  Limited  Modes of Intervention:  Discussion, Exploration and Socialization  Summary of Progress/Problems: The topic for group was balance in life.  Pt participated in the discussion about when their life was in balance and out of balance and how this feels.  Pt discussed ways to get back in balance and short term goals they can work on to get where they want to be.  Lucretia KernShianne talked about how music helps calm him and find balance, especially if he is around judgmental people.  Input limited, but at the end of group stated that his step mother died and his sister is on the child and adolescent unit.  When asked how we could support him with this, he asked for prayers.  There was no accompanying emotion with this announcement, and he added that it make him feel unbalance.   Daryel Geraldorth, Jayshaun Phillips B 09/11/2013 2:50 PM

## 2013-09-11 NOTE — Progress Notes (Signed)
Patient ID: Joshua BreachShianne Franco, male   DOB: 03/25/1991, 23 y.o.   MRN: [id number]  D: Pt. Denies HI to this Clinical research associatewriter. Patient endorses passive SI but contracts for safety. Patient reports he is seeing "a dark place" and is hearing voices. Patient does report pain in right arm due to an IM vaccine administered yesterday. However, patient is seen in the milieu with no distress. Patient has requested no intervention from this Clinical research associatewriter. NP has assessed the area of the patient's concern as well. No signs of abnormality present.  A: Support and encouragement provided to the patient. Scheduled medications given to patient per physician's orders.  R: Patient is receptive and cooperative but minimal. Patient has been in bed mostly this morning. Q15 minute checks are maintained for safety.

## 2013-09-11 NOTE — Progress Notes (Signed)
Case discussed, and agree with plan 

## 2013-09-11 NOTE — [facility] Group Notes (Signed)
[facility] Group Notes:  (Nursing/MHT/Case Management/Adjunct)  Date:  09/11/2013  Time:  11:00 AM  Type of Therapy:  Nurse Education  Participation Level:  Did Not Attend  Participation Quality:  Did not attend  Affect:  Did not attend  Cognitive:  Did not attend  Insight:  None  Engagement in Group:  Did not attend  Modes of Intervention:  Discussion  Summary of Progress/Problems:  Cove Haydon E Janeann Paisley 09/11/2013, 11:00 AM

## 2013-09-12 NOTE — Care Management Utilization Note (Signed)
   Per State Regulation 482.30  This chart was reviewed for necessity with respect to the patient's Admission/ Duration of stay.  Next review date: 09/15/13  Nicolasa Duckingrystal Morrison RN, BSN

## 2013-09-12 NOTE — Progress Notes (Signed)
Adult Psychoeducational Group Note  Date:  09/12/2013 Time:  6:41 PM  Group Topic/Focus:  Early Warning Signs:   The focus of this group is to help patients identify signs or symptoms they exhibit before slipping into an unhealthy state or crisis.  Participation Level:  Active  Participation Quality:  Appropriate, Attentive, Sharing and Supportive  Affect:  Appropriate  Cognitive:  Appropriate  Insight: Appropriate  Engagement in Group:  Engaged  Modes of Intervention:  Discussion  Additional Comments:    Shivank Pinedo C Cleo Villamizar 09/12/2013, 6:41 PM 
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
## 2013-09-12 NOTE — Progress Notes (Signed)
D: Patient denies HI and reports on and off thoughts of SI; patient reports that he is hearing voices telling him to kill himself and reports that he is seeing dead bodies and after reporting this patient turned around and began to sing lyrics to a song in the dayroom; patient reported that he feels the lithium is making him drowsy but still wanted to take the medication and the physician Akintayo and NP Vernona RiegerLaura was made aware  A: Monitored q 15 minutes; patient encouraged to attend groups; patient educated about medications; patient given medications per physician orders; patient encouraged to express feelings and/or concerns  R: Patient is animated, laughing, and joking; patient is appropriate to circumstances and cooperative; patient is assertive; patient's interaction with staff and peers is very social and he can be intrusive at times; patient was able to set goal to talk with staff 1:1 when having feelings of SI; patient is taking medications as prescribed and tolerating medications; patient is attending all groups and engaging
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
## 2013-09-12 NOTE — Progress Notes (Signed)
D   Pt is cooperative and pleasant on approach   He interacts well with others   He reports still having voices but does not appear to be responding to internal stimuli   He reports he is depressed but his behavior is silly and childlike A   Verbal support given   Medications administered and effectiveness monitored    Q 15 min checks R   Pt safe at present
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
## 2013-09-12 NOTE — [facility] Group Notes (Signed)
[facility]   09/12/2013 10:40 AM  Participation Quality:  Engaged  Mood/Affect:  Excited  Depression Rating:  7  Anxiety Rating:  7  Thoughts of Suicide:  Yes Will you contract for safety?   Yes  Current AVH:  Yes  Plan for Discharge/Comments:  Lucretia KernShianne spoke excitedly and in  detail about his participation in [employer] last night.  He then talked about all his symptoms that continue.  Transportation Means: GH  Supports: [facility]  Ida Rogueodney B Jaryn Rosko

## 2013-09-12 NOTE — Progress Notes (Signed)
Patient ID: Joshua Franco, male   DOB: 12/05/1990, 23 y.o.   MRN: [id number] [facility]  09/12/2013 11:43 AM Joshua Franco  MRN:  [id number]  Subjective: Patient states "I am still hearing voices. But they are a little better because they are not all the time. I can ignore them more. I'm not seeing dead bodies as much either. I have thoughts to hurt myself. My stepmother died of a cocaine overdose yesterday."   Objective:  Patient is seen and chart is reviewed. Patient continues to report feeling depressed, suicidal, anxious, psychotic and depressed. He is rating his depression/anxiety at 8/10, which is slightly lower than yesterday. Patient is providing inconsistent stories about his stressors. He is now reporting it's his mother who upsets him but is not acting overly upset about a supposed recent death. When asked if the death can be confirmed with a family member the patient is not able to provide any contact information. Patient is observed interacting in the hall with peers and is childlike at times. He was prompted to not sit in the floor of the unit. Patient is complaint with his medications. He feels as though his new medications of Lithium/Abilify is helping to stabilize his mood.   Diagnosis:   DSM5: Schizophrenia Disorders:  Delusional Disorder (297.1)  Obsessive-Compulsive Disorders: recurrent obsessive thoughts, fixated on drinking gasoline Trauma-Stressor Disorders:  Posttraumatic Stress Disorder (309.81) Substance/Addictive Disorders:   Depressive Disorders:   Total Time spent with patient: 20 minutes  Axis I: Schizoaffective disorder, unspecified condition           PTSD Axis II: Borderline IQ Axis III:  Past Medical History  Diagnosis Date  . Diabetes mellitus without complication   . Bipolar 1 disorder   . PTSD (post-traumatic stress disorder)   . MR (mental retardation)   . Obesity   . GERD (gastroesophageal reflux disease)   . Hyperlipidemia   .  Hyperactive   . HTN (hypertension)   . Anxiety disorder   . Obesity hypoventilation syndrome 05/06/2013  . 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 Depression    Axis IV: other psychosocial or environmental problems, problems related to social environment and problems with primary support group  ADL's:  Intact  Sleep: Fair  Appetite:  Fair  Suicidal Ideation: yes Plan:  to drink gasoline Intent:  denies  Means:  denies Homicidal Ideation:  denies AEB (as evidenced by):  Psychiatric Specialty Exam: Physical Exam  Psychiatric: His mood appears anxious. His speech is rapid and/or pressured and tangential. He is actively hallucinating. Thought content is paranoid. Cognition and memory are normal. He expresses impulsivity. He exhibits a depressed mood. He expresses suicidal ideation. He expresses suicidal plans.    Review of Systems  Constitutional: Negative.   HENT: Negative.   Eyes: Negative.   Respiratory: Negative.   Cardiovascular: Negative.   Gastrointestinal: Negative.   Genitourinary: Negative.   Musculoskeletal: Negative.   Skin: Negative.   Neurological: Negative.   Endo/Heme/Allergies: Negative.   Psychiatric/Behavioral: Positive for depression, suicidal ideas and hallucinations. Negative for memory loss and substance abuse. The patient is nervous/anxious. The patient does not have insomnia.     Blood pressure 113/76, pulse 97, temperature 98.1 F (36.7 C), temperature source Oral, resp. rate 20, height 5' 7.32" (1.71 m), weight 117.935 kg (260 lb).Body mass index is 40.33 kg/(m^2).  General Appearance: Fairly Groomed  Patent attorneyye Contact::  Good  Speech:  Pressured  Volume:  Increased  Mood:  Euphoric  Affect:  Full Range  Thought Process:  Circumstantial and Disorganized  Orientation:  Full (Time, Place, and Person)  Thought Content:  Delusions and Hallucinations: Auditory  Suicidal Thoughts:  Yes.  with intent/plan  Homicidal Thoughts:  No  Memory:  Immediate;    Fair Recent;   Fair Remote;   Fair  Judgement:  Poor  Insight:  Lacking  Psychomotor Activity:  Increased  Concentration:  Fair  Recall:  [employer]Fair  Fund of Knowledge:Fair  Language: Good  Akathisia:  No  Handed:  Right  AIMS (if indicated):     Assets:  Communication Skills Desire for Improvement Physical Health  Sleep:  Number of Hours: 6.5   Musculoskeletal: Strength & Muscle Tone: within normal limits Gait & Station: normal Patient leans: N/A  Current Medications: Current Facility-Administered Medications  Medication Dose Route Frequency Provider Last Rate Last Dose  . acetaminophen (TYLENOL) tablet 650 mg  650 mg Oral Q6H PRN Fransisca KaufmannLaura Davis, NP   650 mg at 09/10/13 1819  . ARIPiprazole (ABILIFY) tablet 5 mg  5 mg Oral BID PC Alani Lacivita   5 mg at 09/12/13 0936  . benztropine (COGENTIN) tablet 0.5 mg  0.5 mg Oral BID Nanine MeansJamison Lord, NP   0.5 mg at 09/12/13 0825  . carvedilol (COREG) tablet 6.25 mg  6.25 mg Oral BID WC Nanine MeansJamison Lord, NP   6.25 mg at 09/12/13 0825  . fenofibrate tablet 160 mg  160 mg Oral Daily Macklen Wilhoite   160 mg at 09/12/13 0825  . FLUoxetine (PROZAC) capsule 20 mg  20 mg Oral Daily Khyli Swaim   20 mg at 09/12/13 0825  . ibuprofen (ADVIL,MOTRIN) tablet 600 mg  600 mg Oral Q6H PRN Coriana Angello      . lithium carbonate (ESKALITH) CR tablet 450 mg  450 mg Oral BID PC Cadel Stairs   450 mg at 09/12/13 0936  . metFORMIN (GLUCOPHAGE) tablet 1,000 mg  1,000 mg Oral BID WC Nanine MeansJamison Lord, NP   1,000 mg at 09/12/13 0825  . pantoprazole (PROTONIX) EC tablet 20 mg  20 mg Oral BID Nanine MeansJamison Lord, NP   20 mg at 09/12/13 0825  . prazosin (MINIPRESS) capsule 2 mg  2 mg Oral QHS Shacoya Burkhammer   2 mg at 09/11/13 2217  . simvastatin (ZOCOR) tablet 20 mg  20 mg Oral QPM Nanine MeansJamison Lord, NP   20 mg at 09/11/13 1701    Lab Results:  Results for orders placed during the hospital encounter of 09/08/13 (from the past 48 hour(s))  TSH     Status: None   Collection Time     09/11/13  6:20 AM      Result Value Ref Range   TSH 2.680  0.350 - 4.500 uIU/mL   Comment: Please note change in reference range.     [facility]  T3, FREE     Status: None   Collection Time    09/11/13  6:20 AM      Result Value Ref Range   T3, Free 2.9  2.3 - 4.2 pg/mL   Comment: [facility]    Physical Findings: AIMS: Facial and Oral Movements Muscles of Facial Expression: None, normal Lips and Perioral Area: None, normal Jaw: None, normal Tongue: None, normal,Extremity Movements Upper (arms, wrists, hands, fingers): None, normal Lower (legs, knees, ankles, toes): None, normal, Trunk Movements Neck, shoulders, hips: None, normal, Overall Severity Severity of abnormal movements (highest score from questions above): None, normal Incapacitation due to abnormal movements: None,  normal Patient's awareness of abnormal movements (rate only patient's report): No Awareness, Dental Status Current problems with teeth and/or dentures?: No Does patient usually wear dentures?: No  CIWA:    COWS:     Treatment Plan Summary: Daily contact with patient to assess and evaluate symptoms and progress in treatment Medication management  Plan:1. Continue crisis management and stabilization. 2. Medication management to reduce current symptoms to base line and improve the  patient's overall level of functioning: -Continue Abilify 5mg  bid for delusions/psychosis -Continue Lithium CR 450mg  bid for mood/depression/ recurrent suicidal thoughts -Continue Prozac 20mg  daily for PTSD -Continue Prazosin 2mg  qhs for nightmares/PTSD 3. Treat health problems as indicated. 4. Develop treatment plan to decrease risk of relapse upon discharge and the need for  readmission. 5. Psycho-social education regarding relapse prevention and self care. 6. Health care follow up as needed for medical problems. 7. Restart home medications where appropriate. 8. Lithium level on  09/16/13.   Medical Decision Making Problem Points:  Established problem, stable/improving (1), Review of last therapy session (1) and Review of psycho-social stressors (1) Data Points:  Order Aims Assessment (2) Review or order clinical lab tests (1) Review of medication regiment & side effects (2)  I certify that inpatient services furnished can reasonably be expected to improve the patient's condition.   Candace Cruise 09/12/2013, 11:43 AM  Patient seen, evaluated and I agree with notes by Nurse Practitioner. Thedore Mins, MD

## 2013-09-13 MED ORDER — HYDROXYZINE HCL 50 MG/ML IM SOLN
50.0000 mg | Freq: Three times a day (TID) | INTRAMUSCULAR | Status: DC | PRN
  Filled 2013-09-13: qty 1

## 2013-09-13 MED ORDER — HYDROXYZINE HCL 50 MG/ML IM SOLN: 50.0000 mg | Freq: Once | INTRAMUSCULAR | Status: DC

## 2013-09-13 MED ORDER — HYDROXYZINE HCL 50 MG PO TABS
50.0000 mg | ORAL_TABLET | Freq: Three times a day (TID) | ORAL | Status: DC | PRN
  Administered 2013-09-13 – 2013-09-19 (×6): 50 mg via ORAL
  Filled 2013-09-13 (×6): qty 1

## 2013-09-13 MED ORDER — HYDROXYZINE HCL 50 MG PO TABS
ORAL_TABLET | ORAL | Status: AC
  Administered 2013-09-13: 13:00:00
  Filled 2013-09-13: qty 1

## 2013-09-13 NOTE — Progress Notes (Addendum)
Pt is very hyper and loud and attention seeking. Pt told all group members that he has attempted SI three times and it has not worked. He did read in group about positive attitudes and told the nurse 5 positive attributes he likes about himself. He stated,"I am loveable, nice, caring, handsome and a true animal lover. Pt does contract for safety and denies SI and HI.e did state,"I like my group home but sometimes just want to die." Pt denies hearing any voices.1pm _pt stated he was feeling very nervous and needed medicine. Pt was given 50mg  of visteral. He asked the nurse if she would give him oxy. Pt also claims he punched a wall unwitnessed and does have a small abrasion on his right hand.Pt does have full range of motion of his hand and refused to have ice applied. Pt was given a badnaid for the abrasion and instructed top let the nurse know if it becomes painful. Pt is very attention seeking. At 2:15pm pt does appear more calm after receiving 50mg  of visteral. 5pm-Pt has top continue to be reminded to not get into other pts space.
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
## 2013-09-13 NOTE — Progress Notes (Signed)
Patient ID: Ernst BreachShianne Gatton, male   DOB: 02/02/1991, 23 y.o.   MRN: [id number] Psychoeducational Group Note  Date:  09/13/2013 Time 0910am  Group Topic/Focus:  Identifying Needs:   The focus of this group is to help patients identify their personal needs that have been historically problematic and identify healthy behaviors to address their needs.  Participation Level:  Active  Participation Quality:  Appropriate  Affect:  Appropriate  Cognitive:  Appropriate  Insight:  Supportive  Engagement in Group:  Supportive  Additional Comments:  Inventory group   Valente DavidWeaver, Rendy Lazard Brooks 09/13/2013,10:34 AM

## 2013-09-13 NOTE — Progress Notes (Signed)
Patient ID: Ernst BreachShianne Franco, male   DOB: 08/07/1990, 23 y.o.   MRN: [id number] Psychoeducational Group Note  Date:  09/13/2013 Time:0920am  Group Topic/Focus:  Identifying Needs:   The focus of this group is to help patients identify their personal needs that have been historically problematic and identify healthy behaviors to address their needs.  Participation Level:  Active  Participation Quality:  Appropriate  Affect:  Appropriate  Cognitive:  Appropriate  Insight:  Supportive  Engagement in Group:  Supportive  Additional Comments:  Healthy coping skills.   Valente DavidWeaver, Nicco Reaume Brooks 09/13/2013,10:35 AM

## 2013-09-13 NOTE — Progress Notes (Signed)
Patient ID: Joshua Franco, male   DOB: 02/17/1991, 23 y.o.   MRN: [id number] [facility]  09/13/2013 1:27 PM Joshua Franco  MRN:  [id number]  Subjective: Patient continues to endorse hearing voices but they are not that as loud as yesterday."   Objective:  Patient is seen and chart is reviewed. Patient continues to report feeling depressed, suicidal, anxious, psychotic and depressed. States depressive toughts are not as severe.  He is rating his depression/anxiety at 8/10, which is slightly lower than yesterday. Patient is providing inconsistent stories about his stressors. Interacts in the hallway. Patient is complaint with his medications. He feels as though his new medications of Lithium/Abilify is helping to stabilize his mood.   Diagnosis:   DSM5: Schizophrenia Disorders:  Delusional Disorder (297.1)  Obsessive-Compulsive Disorders: recurrent obsessive thoughts, fixated on drinking gasoline Trauma-Stressor Disorders:  Posttraumatic Stress Disorder (309.81) Substance/Addictive Disorders:   Depressive Disorders:   Total Time spent with patient: 20 minutes  Axis I: Schizoaffective disorder, unspecified condition           PTSD Axis II: Borderline IQ Axis III:  Past Medical History  Diagnosis Date  . Diabetes mellitus without complication   . Bipolar 1 disorder   . PTSD (post-traumatic stress disorder)   . MR (mental retardation)   . Obesity   . GERD (gastroesophageal reflux disease)   . Hyperlipidemia   . Hyperactive   . HTN (hypertension)   . Anxiety disorder   . Obesity hypoventilation syndrome 05/06/2013  . 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 Depression    Axis IV: other psychosocial or environmental problems, problems related to social environment and problems with primary support group  ADL's:  Intact  Sleep: Fair  Appetite:  Fair  Suicidal Ideation: yes Plan:  to drink gasoline Intent:  denies  Means:  denies Homicidal Ideation:  denies AEB (as  evidenced by):  Psychiatric Specialty Exam: Physical Exam  Psychiatric: His mood appears anxious. His speech is rapid and/or pressured and tangential. He is actively hallucinating. Thought content is paranoid. Cognition and memory are normal. He expresses impulsivity. He exhibits a depressed mood. He expresses suicidal ideation. He expresses suicidal plans.    Review of Systems  Constitutional: Negative.   HENT: Negative.   Eyes: Negative.   Respiratory: Negative.   Cardiovascular: Negative.   Gastrointestinal: Negative.   Genitourinary: Negative.   Musculoskeletal: Negative.   Skin: Negative.   Neurological: Negative.   Endo/Heme/Allergies: Negative.   Psychiatric/Behavioral: Positive for depression, suicidal ideas and hallucinations. Negative for memory loss and substance abuse. The patient is nervous/anxious. The patient does not have insomnia.     Blood pressure 94/65, pulse 109, temperature 97.4 F (36.3 C), temperature source Oral, resp. rate 17, height 5' 7.32" (1.71 m), weight 117.935 kg (260 lb), SpO2 96.00%.Body mass index is 40.33 kg/(m^2).  General Appearance: Fairly Groomed  Patent attorneyye Contact::  Good  Speech:  Pressured  Volume:  Increased  Mood:  Euphoric  Affect:  Full Range  Thought Process:  Circumstantial and Disorganized  Orientation:  Full (Time, Place, and Person)  Thought Content:  Delusions and Hallucinations: Auditory  Suicidal Thoughts:  Yes.  with intent/plan  Homicidal Thoughts:  No  Memory:  Immediate;   Fair Recent;   Fair Remote;   Fair  Judgement:  Poor  Insight:  Lacking  Psychomotor Activity:  Increased  Concentration:  Fair  Recall:  [employer]Fair  Fund of Knowledge:Fair  Language: Good  Akathisia:  No  Handed:  Right  AIMS (if indicated):     Assets:  Communication Skills Desire for Improvement Physical Health  Sleep:  Number of Hours: 6.5   Musculoskeletal: Strength & Muscle Tone: within normal limits Gait & Station: normal Patient leans:  N/A  Current Medications: Current Facility-Administered Medications  Medication Dose Route Frequency Provider Last Rate Last Dose  . acetaminophen (TYLENOL) tablet 650 mg  650 mg Oral Q6H PRN Fransisca KaufmannLaura Davis, NP   650 mg at 09/10/13 1819  . ARIPiprazole (ABILIFY) tablet 5 mg  5 mg Oral BID PC Mojeed Akintayo   5 mg at 09/13/13 0837  . benztropine (COGENTIN) tablet 0.5 mg  0.5 mg Oral BID Nanine MeansJamison Lord, NP   0.5 mg at 09/13/13 0837  . carvedilol (COREG) tablet 6.25 mg  6.25 mg Oral BID WC Nanine MeansJamison Lord, NP   6.25 mg at 09/13/13 0837  . fenofibrate tablet 160 mg  160 mg Oral Daily Mojeed Akintayo   160 mg at 09/13/13 0837  . FLUoxetine (PROZAC) capsule 20 mg  20 mg Oral Daily Mojeed Akintayo   20 mg at 09/13/13 0837  . hydrOXYzine (VISTARIL) injection 50 mg  50 mg Intramuscular Once Thresa RossNadeem Caragh Gasper, MD      . hydrOXYzine (VISTARIL) injection 50 mg  50 mg Intramuscular Q8H PRN Thresa RossNadeem Akhil Piscopo, MD      . ibuprofen (ADVIL,MOTRIN) tablet 600 mg  600 mg Oral Q6H PRN Mojeed Akintayo      . lithium carbonate (ESKALITH) CR tablet 450 mg  450 mg Oral BID PC Mojeed Akintayo   450 mg at 09/13/13 0838  . metFORMIN (GLUCOPHAGE) tablet 1,000 mg  1,000 mg Oral BID WC Nanine MeansJamison Lord, NP   1,000 mg at 09/13/13 [id number]  . pantoprazole (PROTONIX) EC tablet 20 mg  20 mg Oral BID Nanine MeansJamison Lord, NP   20 mg at 09/13/13 [id number]  . prazosin (MINIPRESS) capsule 2 mg  2 mg Oral QHS Mojeed Akintayo   2 mg at 09/12/13 2238  . simvastatin (ZOCOR) tablet 20 mg  20 mg Oral QPM Nanine MeansJamison Lord, NP   20 mg at 09/12/13 1821    Lab Results:  No results found for this or any previous visit (from the past 48 hour(s)).  Physical Findings: AIMS: Facial and Oral Movements Muscles of Facial Expression: None, normal Lips and Perioral Area: None, normal Jaw: None, normal Tongue: None, normal,Extremity Movements Upper (arms, wrists, hands, fingers): None, normal Lower (legs, knees, ankles, toes): None, normal, Trunk Movements Neck, shoulders, hips:  None, normal, Overall Severity Severity of abnormal movements (highest score from questions above): None, normal Incapacitation due to abnormal movements: None, normal Patient's awareness of abnormal movements (rate only patient's report): No Awareness, Dental Status Current problems with teeth and/or dentures?: No Does patient usually wear dentures?: No  CIWA:    COWS:     Treatment Plan Summary: Daily contact with patient to assess and evaluate symptoms and progress in treatment Medication management  Plan:1. Continue crisis management and stabilization. 2. Medication management to reduce current symptoms to base line and improve the  patient's overall level of functioning: -Continue Abilify 5mg  bid for delusions/psychosis -Continue Lithium CR 450mg  bid for mood/depression/ recurrent suicidal thoughts -Continue Prozac 20mg  daily for PTSD -Continue Prazosin 2mg  qhs for nightmares/PTSD 3. Treat health problems as indicated. 4. Develop treatment plan to decrease risk of relapse upon discharge and the need for  readmission. 5. Psycho-social education regarding relapse prevention and self care. 6. Health care follow up as needed for medical problems. 7. Restart  home medications where appropriate. 8. Lithium level on 09/16/13.   Medical Decision Making Problem Points:  Established problem, stable/improving (1), Review of last therapy session (1) and Review of psycho-social stressors (1) Data Points:  Order Aims Assessment (2) Review or order clinical lab tests (1) Review of medication regiment & side effects (2)  I certify that inpatient services furnished can reasonably be expected to improve the patient's condition.   Thresa Ross MD 09/13/2013, 1:27 PM

## 2013-09-14 DIAGNOSIS — F3130 Bipolar disorder, current episode depressed, mild or moderate severity, unspecified: Secondary | ICD-10-CM

## 2013-09-14 LAB — GLUCOSE, CAPILLARY: Glucose-Capillary: 111 mg/dL — ABNORMAL HIGH (ref 70–99)

## 2013-09-14 MED ORDER — TRAZODONE HCL 100 MG PO TABS
100.0000 mg | ORAL_TABLET | Freq: Every day | ORAL | Status: DC
Start: 2013-09-14 — End: 2013-09-16
  Administered 2013-09-14 – 2013-09-15 (×2): 100 mg via ORAL
  Filled 2013-09-14 (×3): qty 1

## 2013-09-14 NOTE — Progress Notes (Signed)
Pt attention seeking and inappropriate this evening in the dayroom. Bragging about his involvement with the gang, [id number] and states his nickname is "Killer." Poor boundaries, intrusive, silly and immature. Informed this Clinical research associatewriter that he had just talked to the head of the [employer] gang who wanted the doctor's phone number so that he could call to discuss changing patient's meds. Pt also telling stories about his plan to attend [employer] when he leaves the hospital as they have recruited him to play football. He is animated, social and shows no outward signs of depression or hallucinations even though he endorses seeing dead people. Medicated per orders. Given vistaril due to his unpredictable behavior. Redirected as needed. Denies SI/HI/AH. Currently resting in bed. Joshua Franco Joshua Franco

## 2013-09-14 NOTE — Progress Notes (Signed)
Adult Psychoeducational Group Note  Date:  09/14/2013 Time:  12:56 AM  Group Topic/Focus:  Wrap-Up Group:   The focus of this group is to help patients review their daily goal of treatment and discuss progress on daily workbooks.  Participation Level:  Active  Participation Quality:  Appropriate  Affect:  Appropriate  Cognitive:  Appropriate  Insight: Appropriate  Engagement in Group:  Engaged  Modes of Intervention:  Discussion  Additional Comments: The patient expressed that he learn in group to not harm yourself.  Laneta Simmersrthur L Shalisa Mcquade 09/14/2013, 12:56 AM

## 2013-09-14 NOTE — Progress Notes (Signed)
Patient ID: Ernst BreachShianne Brecheisen, male DOB: 10/08/1990, 23 y.o. MRN: [id number] Psychoeducational Group Note  Date: 09/14/2013   Time: 0945   Group Topic/Focus Conflict Resolution: Discussed what conflict is and positive and negative ways in which conflict can be resolved  :  Participation Level: Active   Participation Quality: Attentive   Affect: Appropriate  Cognitive: Appropriate  Insight: Improving   Engagement in Group: Sharing

## 2013-09-14 NOTE — Progress Notes (Signed)
[facility] Group Notes:  (Nursing/MHT/Case Management/Adjunct)  Date:  09/14/2013  Time:  8:00 p.m.   Type of Therapy:  Psychoeducational Skills  Participation Level:  Active  Participation Quality:  Attentive  Affect:  Depressed  Cognitive:  Appropriate  Insight:  Improving  Engagement in Group:  Developing/Improving  Modes of Intervention:  Education  Summary of Progress/Problems: The patient mentioned in group that his day did not start off properly since his blood pressure was low this morning. He later shared that he tried to stay to himself as much as possible. The patient also shared in group that his brother was killed today. Later in the evening, the patient expressed to this Thereasa Parkinauthor that his step mother called to tell him that his brother was killed and that somehow his mother argued with him over the phone. He also states that his mother is blaming him for his brother's death. As a theme for the day, the patient's support system will be comprised of some of his peers in this hospital (?).   Westly PamBenjamin S Treesa Mccully 09/14/2013, 11:55 PM

## 2013-09-14 NOTE — Progress Notes (Signed)
Pt claimed that approximately 0845, pt was in bathroom and claims that he slipped on some standing water that was on floor and then fell into sink hitting right cheek area. There was some blood present on the sink, however, pt does not complain of any pain and states that he is fine. NP and AC notified of fall and vital signs were taken. This fall was un-witnessed by staff or peers on the hall. Will continue to monitor.

## 2013-09-14 NOTE — Progress Notes (Signed)
[facility]  09/14/2013 10:37 AM Joshua BreachShianne Franco  MRN:  [id number] Subjective:  Patient stated he was a "little bit" suicidal because another patient upset him and would like to stay until Friday so he could get his medications regulated.  This provider explained to the client that he had stabilized a great deal compared to his admission state and five more days would not be an option.  [facility] and now claims to be hearing voices but does not appear to be responding to internal stimuli.  His sister is on the child/adolescent unit and his mother resides in a group home.  Pleasant, cooperative, denies depression.  Reports poor sleep-Trazodone ordered. Diagnosis:   DSM5:  Trauma-Stressor Disorders:  Posttraumatic Stress Disorder (309.81) Total Time spent with patient: 20 minutes  Axis I: Bipolar, Depressed and Post Traumatic Stress Disorder Axis II: Mental retardation, severity unknown Axis III:  Past Medical History  Diagnosis Date  . Diabetes mellitus without complication   . Bipolar 1 disorder   . PTSD (post-traumatic stress disorder)   . MR (mental retardation)   . Obesity   . GERD (gastroesophageal reflux disease)   . Hyperlipidemia   . Hyperactive   . HTN (hypertension)   . Anxiety disorder   . Obesity hypoventilation syndrome 05/06/2013  . 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 Depression    Axis IV: other psychosocial or environmental problems, problems related to social environment and problems with primary support group Axis V: 41-50 serious symptoms  ADL's:  Intact  Sleep: Poor  Appetite:  Good  Suicidal Ideation:  Plan:  none Intent:  none Means:  none Homicidal Ideation:  Denies  Psychiatric Specialty Exam: Physical Exam  Constitutional: He is oriented to person, place, and time. He appears well-developed and well-nourished.  HENT:  Head: Normocephalic.  Neck: Normal range of motion.  Respiratory: Effort normal.  GI: Soft.   Musculoskeletal: Normal range of motion.  Neurological: He is alert and oriented to person, place, and time.  Skin: Skin is warm and dry.    Review of Systems  Constitutional: Negative.   HENT: Negative.   Eyes: Negative.   Respiratory: Negative.   Cardiovascular: Negative.   Gastrointestinal: Negative.   Genitourinary: Negative.   Musculoskeletal: Negative.   Skin: Negative.   Neurological: Negative.   Endo/Heme/Allergies: Negative.   Psychiatric/Behavioral: Positive for hallucinations. The patient is nervous/anxious.     Blood pressure 126/83, pulse 90, temperature 98.1 F (36.7 C), temperature source Oral, resp. rate 18, height 5' 7.32" (1.71 m), weight 260 lb (117.935 kg), SpO2 96.00%.Body mass index is 40.33 kg/(m^2).  General Appearance: Casual  Eye Contact::  Good  Speech:  Normal Rate  Volume:  Normal  Mood:  Euthymic  Affect:  Congruent  Thought Process:  Coherent  Orientation:  Full (Time, Place, and Person)  Thought Content:  Hallucinations: Auditory  Suicidal Thoughts:  No  Homicidal Thoughts:  No  Memory:  Immediate;   Good Recent;   Good Remote;   Good  Judgement:  Fair  Insight:  Fair  Psychomotor Activity:  Normal  Concentration:  Fair  Recall:  [employer]Fair  Fund of Knowledge:Fair  Language: Fair  Akathisia:  No  Handed:  Right  AIMS (if indicated):     Assets:  Housing Leisure Time Physical Health Resilience  Sleep:  Number of Hours: 5   Musculoskeletal: Strength & Muscle Tone: within normal limits Gait & Station: normal Patient leans: N/A  Current Medications: Current Facility-Administered  Medications  Medication Dose Route Frequency Provider Last Rate Last Dose  . acetaminophen (TYLENOL) tablet 650 mg  650 mg Oral Q6H PRN Fransisca KaufmannLaura Davis, NP   650 mg at 09/10/13 1819  . ARIPiprazole (ABILIFY) tablet 5 mg  5 mg Oral BID PC Mojeed Akintayo   5 mg at 09/14/13 0816  . benztropine (COGENTIN) tablet 0.5 mg  0.5 mg Oral BID Nanine MeansJamison Lord, NP   0.5 mg at  09/14/13 0815  . carvedilol (COREG) tablet 6.25 mg  6.25 mg Oral BID WC Nanine MeansJamison Lord, NP   6.25 mg at 09/14/13 0816  . fenofibrate tablet 160 mg  160 mg Oral Daily Mojeed Akintayo   160 mg at 09/14/13 0816  . FLUoxetine (PROZAC) capsule 20 mg  20 mg Oral Daily Mojeed Akintayo   20 mg at 09/14/13 0815  . hydrOXYzine (ATARAX/VISTARIL) tablet 50 mg  50 mg Oral TID PRN Thresa RossNadeem Maycee Blasco, MD   50 mg at 09/13/13 2135  . ibuprofen (ADVIL,MOTRIN) tablet 600 mg  600 mg Oral Q6H PRN Mojeed Akintayo      . lithium carbonate (ESKALITH) CR tablet 450 mg  450 mg Oral BID PC Mojeed Akintayo   450 mg at 09/14/13 0816  . metFORMIN (GLUCOPHAGE) tablet 1,000 mg  1,000 mg Oral BID WC Nanine MeansJamison Lord, NP   1,000 mg at 09/14/13 0816  . pantoprazole (PROTONIX) EC tablet 20 mg  20 mg Oral BID Nanine MeansJamison Lord, NP   20 mg at 09/14/13 0816  . prazosin (MINIPRESS) capsule 2 mg  2 mg Oral QHS Mojeed Akintayo   2 mg at 09/13/13 2136  . simvastatin (ZOCOR) tablet 20 mg  20 mg Oral QPM Nanine MeansJamison Lord, NP   20 mg at 09/13/13 1658    Lab Results:  Results for orders placed during the hospital encounter of 09/08/13 (from the past 48 hour(s))  GLUCOSE, CAPILLARY     Status: Abnormal   Collection Time    09/14/13  8:42 AM      Result Value Ref Range   Glucose-Capillary 111 (*) 70 - 99 mg/dL    Physical Findings: AIMS: Facial and Oral Movements Muscles of Facial Expression: None, normal Lips and Perioral Area: None, normal Jaw: None, normal Tongue: None, normal,Extremity Movements Upper (arms, wrists, hands, fingers): None, normal Lower (legs, knees, ankles, toes): None, normal, Trunk Movements Neck, shoulders, hips: None, normal, Overall Severity Severity of abnormal movements (highest score from questions above): None, normal Incapacitation due to abnormal movements: None, normal Patient's awareness of abnormal movements (rate only patient's report): No Awareness, Dental Status Current problems with teeth and/or dentures?:  No Does patient usually wear dentures?: No  CIWA:    COWS:     Treatment Plan Summary: Daily contact with patient to assess and evaluate symptoms and progress in treatment Medication management  Plan:  Review of chart, vital signs, medications, and notes. 1-Individual and group therapy 2-Medication management for depression and anxiety:  Medications reviewed with the patient and Trazodone 100 mg ordered for sleep 3-Coping skills for depression, psychosis 4-Continue crisis stabilization and management 5-Address health issues--monitoring vital signs, stable 6-Treatment plan in progress to prevent relapse of depression and chronic mental illness  Medical Decision Making Problem Points:  Established problem, stable/improving (1) and Review of psycho-social stressors (1) Data Points:  Review of medication regiment & side effects (2)  I certify that inpatient services furnished can reasonably be expected to improve the patient's condition.   Nanine MeansJamison Lord, PMH-NP 09/14/2013, 10:37 AM I agreed with  findings and treatment plan of this patient

## 2013-09-14 NOTE — Progress Notes (Signed)
D. Pt has been up and has been active in milieu throughout the day today, has been attending and participating in various activities. Pt seen interacting and playing cards with other peers. Pt did endorse auditory hallucinations today, and did state that he feels his medications are not working. Pt also reports having difficulties sleeping at night. A. Support and encouragement provided, medication education given. R. Pt verbalized understanding, safety maintained.

## 2013-09-14 NOTE — [facility] Group Notes (Signed)
   [facility] LCSW Group Therapy Note   09/14/2013 1:15 PM   Type of Therapy and Topic: Group Therapy: Feelings Around Returning Home & Establishing a Supportive Framework and Activity to Identify signs of Improvement or Decompensation    Participation Level:  Minimal  Mood:  Detached  Description of Group:  Patients first processed thoughts and feelings about up coming discharge. These included fears of upcoming changes, lack of change, new living environments, judgements and expectations from others and overall stigma of MH issues. We then discussed what is a supportive framework? What does it look like feel like and how do I discern it from and unhealthy non-supportive network? Learn how to cope when supports are not helpful and don't support you. Discuss what to do when your family/friends are not supportive.   Therapeutic Goals Addressed in Processing Group:  1. Patient will identify one healthy supportive network that they can use at discharge. 2. Patient will identify one factor of a supportive framework and how to tell it from an unhealthy network. 3. Patient able to identify one coping skill to use when they do not have positive supports from others. 4. Patient will demonstrate ability to communicate their needs through discussion and/or role plays.  Summary of Patient Progress:  Summary of Progress/Problems: The main focus of today's process group was to identify the patient's current support system and decide on other supports that can be put in place. An emphasis was placed on using counselor, doctor, therapy groups, 12-step groups, and problem-specific support groups to expand supports. There was also an extensive discussion about what constitutes a healthy support versus an unhealthy support.  The patient shared his biggest challenge upon discharge will be to stay away from alcohol and drugs and shared his main supports will be in AA and NA. Patient was reading a magazine and did not  contribute unless asked direct questions. When asked to put magazine away pt declined.  Carney Bernatherine C Makaelah Cranfield, LCSW

## 2013-09-15 DIAGNOSIS — F79 Unspecified intellectual disabilities: Secondary | ICD-10-CM

## 2013-09-15 LAB — GLUCOSE, CAPILLARY
Glucose-Capillary: 85 mg/dL (ref 70–99)
Glucose-Capillary: 99 mg/dL (ref 70–99)

## 2013-09-15 MED ORDER — OLANZAPINE 10 MG PO TBDP
10.0000 mg | ORAL_TABLET | Freq: Three times a day (TID) | ORAL | Status: DC | PRN
  Administered 2013-09-15 – 2013-09-19 (×5): 10 mg via ORAL
  Filled 2013-09-15 (×4): qty 1
  Filled 2013-09-15: qty 2

## 2013-09-15 MED ORDER — ARIPIPRAZOLE ER 400 MG IM SUSR
400.0000 mg | Freq: Once | INTRAMUSCULAR | Status: AC
Start: 2013-09-15 — End: 2013-09-15
  Administered 2013-09-15: 400 mg via INTRAMUSCULAR

## 2013-09-15 MED ORDER — BENZTROPINE MESYLATE 1 MG PO TABS
1.0000 mg | ORAL_TABLET | Freq: Two times a day (BID) | ORAL | Status: DC
  Administered 2013-09-15 – 2013-09-19 (×8): 1 mg via ORAL
  Filled 2013-09-15 (×2): qty 1
  Filled 2013-09-15: qty 6
  Filled 2013-09-15 (×3): qty 1
  Filled 2013-09-15: qty 6
  Filled 2013-09-15: qty 1
  Filled 2013-09-15: qty 6
  Filled 2013-09-15 (×3): qty 1
  Filled 2013-09-15: qty 6
  Filled 2013-09-15: qty 1
  Filled 2013-09-15: qty 2
  Filled 2013-09-15: qty 1

## 2013-09-15 MED ORDER — ARIPIPRAZOLE 10 MG PO TABS
10.0000 mg | ORAL_TABLET | Freq: Two times a day (BID) | ORAL | Status: DC
  Administered 2013-09-15 – 2013-09-19 (×8): 10 mg via ORAL
  Filled 2013-09-15 (×2): qty 6
  Filled 2013-09-15 (×5): qty 1
  Filled 2013-09-15: qty 6
  Filled 2013-09-15 (×7): qty 1
  Filled 2013-09-15: qty 6

## 2013-09-15 MED ORDER — ALUM & MAG HYDROXIDE-SIMETH 200-200-20 MG/5ML PO SUSP: 30.0000 mL | Freq: Four times a day (QID) | ORAL | Status: DC | PRN

## 2013-09-15 MED ORDER — DIPHENHYDRAMINE HCL 50 MG/ML IJ SOLN
50.0000 mg | Freq: Once | INTRAMUSCULAR | Status: AC
  Administered 2013-09-15: 50 mg via INTRAMUSCULAR
  Filled 2013-09-15: qty 1

## 2013-09-15 NOTE — Progress Notes (Signed)
Patient ID: Joshua Franco, male   DOB: 04-Jul-1990, 23 y.o.   MRN: [id number] [facility] MD Progress Note  09/15/2013 2:48 PM Harvy Riera  MRN:  [id number] Subjective:   Patient states "That other patient wants to fight me. The medicines are not helping. I feel very angry. I found out yesterday that my little brother got killed in a drive by shooting. It was gang members. I am still hearing voices to kill myself. I am very depressed and anxious. People are not listening to me. That is why I punched the glass window. I do that when I get angry. Don't worry I can move my hand just fine."   Objective:  Patient reports ongoing symptoms of depression, psychosis, agitation, and mood instability. He is observed pacing the units with a piece of blue scrub top wrapped around his head. Patient demonstrates many attention seeking behaviors as he reports nearly daily deaths in his family. He continues to reports multiple symptoms including auditory hallucinations and rates his depression/anxiety at ten. After conflict with his roommate the patient proceeded to break the glass on the door near the nursing station. His right hand was examined with no changes in range of motion, no obvious deformity, and no tenderness to the joints upon palpation. There is some mild redness and swelling to the second phalanges of his right hand. Nursing staff report that patient has approached them stating "I'm suicidal. I need a 1:1." Patient's mood remains very labile and his behavior is unpredictable. However, he is compliant with his medications denying any adverse effects.   Diagnosis:   DSM5:  Trauma-Stressor Disorders:  Posttraumatic Stress Disorder (309.81) Total Time spent with patient: 20 minutes  Axis I: Bipolar, Depressed and Post Traumatic Stress Disorder Axis II: Mental retardation, severity unknown Axis III:  Past Medical History  Diagnosis Date  . Diabetes mellitus without complication   . Bipolar 1 disorder   .  PTSD (post-traumatic stress disorder)   . MR (mental retardation)   . Obesity   . GERD (gastroesophageal reflux disease)   . Hyperlipidemia   . Hyperactive   . HTN (hypertension)   . Anxiety disorder   . Obesity hypoventilation syndrome 05/06/2013  . 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 Depression    Axis IV: other psychosocial or environmental problems, problems related to social environment and problems with primary support group Axis V: 41-50 serious symptoms  ADL's:  Intact  Sleep: Fair   Appetite:  Good  Suicidal Ideation:  Passive SI to drink gasoline  Homicidal Ideation:  Denies  Psychiatric Specialty Exam: Physical Exam  Review of Systems  Constitutional: Negative.   HENT: Negative.   Eyes: Negative.   Respiratory: Negative.   Cardiovascular: Negative.   Gastrointestinal: Negative.   Genitourinary: Negative.   Musculoskeletal: Negative.   Skin: Negative.   Neurological: Negative.   Endo/Heme/Allergies: Negative.   Psychiatric/Behavioral: Positive for depression, suicidal ideas and hallucinations. The patient is nervous/anxious and has insomnia.     Blood pressure 108/61, pulse 108, temperature 97.7 F (36.5 C), temperature source Oral, resp. rate 20, height 5' 7.32" (1.71 m), weight 117.935 kg (260 lb), SpO2 100.00%.Body mass index is 40.33 kg/(m^2).  General Appearance: Casual  Eye Contact::  Good  Speech:  Normal Rate  Volume:  Normal  Mood:  Euthymic  Affect:  Congruent  Thought Process:  Coherent  Orientation:  Full (Time, Place, and Person)  Thought Content:  Hallucinations: Auditory  Suicidal Thoughts:  No  Homicidal Thoughts:  No  Memory:  Immediate;   Good Recent;   Good Remote;   Good  Judgement:  Fair  Insight:  Fair  Psychomotor Activity:  Normal  Concentration:  Fair  Recall:  [employer]Fair  Fund of Knowledge:Fair  Language: Fair  Akathisia:  No  Handed:  Right  AIMS (if indicated):     Assets:  Housing Leisure Time Physical  Health Resilience  Sleep:  Number of Hours: 6.75   Musculoskeletal: Strength & Muscle Tone: within normal limits Gait & Station: normal Patient leans: N/A  Current Medications: Current Facility-Administered Medications  Medication Dose Route Frequency Provider Last Rate Last Dose  . acetaminophen (TYLENOL) tablet 650 mg  650 mg Oral Q6H PRN Fransisca KaufmannLaura Davis, NP   650 mg at 09/10/13 1819  . ARIPiprazole (ABILIFY) tablet 10 mg  10 mg Oral BID PC Fransisca KaufmannLaura Davis, NP      . benztropine (COGENTIN) tablet 0.5 mg  0.5 mg Oral BID Nanine MeansJamison Lord, NP   0.5 mg at 09/15/13 0809  . carvedilol (COREG) tablet 6.25 mg  6.25 mg Oral BID WC Nanine MeansJamison Lord, NP   6.25 mg at 09/15/13 0809  . fenofibrate tablet 160 mg  160 mg Oral Daily Linday Rhodes   160 mg at 09/15/13 0809  . FLUoxetine (PROZAC) capsule 20 mg  20 mg Oral Daily Shanon Becvar   20 mg at 09/15/13 0809  . hydrOXYzine (ATARAX/VISTARIL) tablet 50 mg  50 mg Oral TID PRN Thresa RossNadeem Akhtar, MD   50 mg at 09/15/13 0810  . ibuprofen (ADVIL,MOTRIN) tablet 600 mg  600 mg Oral Q6H PRN Shadman Tozzi   600 mg at 09/14/13 2118  . lithium carbonate (ESKALITH) CR tablet 450 mg  450 mg Oral BID PC Tawanna Funk   450 mg at 09/15/13 0809  . metFORMIN (GLUCOPHAGE) tablet 1,000 mg  1,000 mg Oral BID WC Nanine MeansJamison Lord, NP   1,000 mg at 09/15/13 0809  . OLANZapine zydis (ZYPREXA) disintegrating tablet 10 mg  10 mg Oral Q8H PRN Fransisca KaufmannLaura Davis, NP   10 mg at 09/15/13 1136  . pantoprazole (PROTONIX) EC tablet 20 mg  20 mg Oral BID Nanine MeansJamison Lord, NP   20 mg at 09/15/13 0809  . prazosin (MINIPRESS) capsule 2 mg  2 mg Oral QHS Tomika Eckles   2 mg at 09/14/13 2314  . simvastatin (ZOCOR) tablet 20 mg  20 mg Oral QPM Nanine MeansJamison Lord, NP   20 mg at 09/14/13 1715  . traZODone (DESYREL) tablet 100 mg  100 mg Oral QHS Nanine MeansJamison Lord, NP   100 mg at 09/14/13 2314    Lab Results:  Results for orders placed during the hospital encounter of 09/08/13 (from the past 48 hour(s))  GLUCOSE,  CAPILLARY     Status: Abnormal   Collection Time    09/14/13  8:42 AM      Result Value Ref Range   Glucose-Capillary 111 (*) 70 - 99 mg/dL  GLUCOSE, CAPILLARY     Status: None   Collection Time    09/15/13  7:48 AM      Result Value Ref Range   Glucose-Capillary 85  70 - 99 mg/dL  GLUCOSE, CAPILLARY     Status: None   Collection Time    09/15/13 11:50 AM      Result Value Ref Range   Glucose-Capillary 99  70 - 99 mg/dL    Physical Findings: AIMS: Facial and Oral Movements Muscles of Facial Expression: None, normal Lips and Perioral Area: None, normal Jaw: None, normal Tongue:  None, normal,Extremity Movements Upper (arms, wrists, hands, fingers): None, normal Lower (legs, knees, ankles, toes): None, normal, Trunk Movements Neck, shoulders, hips: None, normal, Overall Severity Severity of abnormal movements (highest score from questions above): None, normal Incapacitation due to abnormal movements: None, normal Patient's awareness of abnormal movements (rate only patient's report): No Awareness, Dental Status Current problems with teeth and/or dentures?: No Does patient usually wear dentures?: No  CIWA:    COWS:     Treatment Plan Summary: Daily contact with patient to assess and evaluate symptoms and progress in treatment Medication management  Plan:  Review of chart, vital signs, medications, and notes. 1-Individual and group therapy 2-Medication management for depression and anxiety: Increase Abilify 10 mg to BID for psychosis, Cogentin 1 mg BID for EPS prevention, Continue Minipress 2 mg hs for PTSD, Trazodone 100 mg for insomnia, Lithium 450 mg 450 mg every twelve hours for improved mood stability Zyprexa Zydis 10 mg every eight hours prn agitation, Initiate Abilify Maintena 400 mg starting every twenty eight days with first dose today for psychosis/agitation, Give Benadryl 50 mg IM once.  3-Coping skills for depression, psychosis 4-Continue crisis stabilization and  management 5-Address health issues--monitoring vital signs, stable 6-Treatment plan in progress to prevent relapse of depression and chronic mental illness 7-Do not admit due to unpredictable and aggressive behaviors   Medical Decision Making Problem Points:  Established problem, worsening (2), Review of last therapy session (1) and Review of psycho-social stressors (1) Data Points:  Order Aims Assessment (2) Review of medication regiment & side effects (2) Review of new medications or change in dosage (2)  I certify that inpatient services furnished can reasonably be expected to improve the patient's condition.   Fransisca KaufmannLaura Davis, NP-C 09/15/2013, 2:48 PM  Patient seen, evaluated and I agree with notes by Nurse Practitioner. Thedore MinsMojeed Sebasthian Stailey, MD

## 2013-09-15 NOTE — Progress Notes (Signed)
Pt continues with bizarre story telling and attention seeking behavior. He reports that his mother called to say his brother was murdered today and it's Khari's fault. He is now refusing all calls from mom. Also complaining of sudden back pain of a 10/10 and stating, "I'm about to go off if you don't give me my pain pill." Motrin 600mg  given along with some vistaril due to pt's unpredictable and labile behavior. Reminded pt that he gave his mother his code number and that staff will attempt to intercede calls but that he would need to convey his wishes to his mother directly. Endorses AVH but this report is incongruent with his behavior. Pt asking to go to the state hospital. "These meds aren't working." Suggested pt use his coping skills and reminded him [facility]. Also suggested that looking at his choices and behavior would be more beneficial than relying on just meds to change his course in life. No additional issues from unwitnessed fall this morning. No pain, bruising, confusion or vomiting. VSS and will continue assessment throughout the night. High fall risk precautions in place and reviewed with patient. Pt safe, resting in bed. Levell JulyMarian Eakes Zachery ConchFriedman

## 2013-09-15 NOTE — Progress Notes (Signed)
Came up to [facility] in hallway and states "Can I get a 1:1, I want to kill myself", [facility] notified nursing staff and NP, writer spoke with patient, pt wants to be able to go outside so he does not want to be on a 1:1, states "I am calm, I won't kill myself".

## 2013-09-15 NOTE — Tx Team (Signed)
  Interdisciplinary Treatment Plan Update   Date Reviewed:  09/15/2013  Time Reviewed:  8:27 AM  Progress in Treatment:   Attending groups: Yes Participating in groups: Yes Taking medication as prescribed: Yes  Tolerating medication: Yes Family/Significant other contact made: Yes  Patient understands diagnosis: Yes  Discussing patient identified problems/goals with staff: Yes Medical problems stabilized or resolved: Yes Denies suicidal/homicidal ideation: No but contracts for safety Patient has not harmed self or others: Yes  For review of initial/current patient goals, please see plan of care.  Estimated Length of Stay:  2-3 days  Reason for Continuation of Hospitalization: Depression Hallucinations Medication stabilization Suicidal ideation  New Problems/Goals identified:  N/A  Discharge Plan or Barriers:   [facility]  Additional Comments:  Continues to c/o symptoms, including SI, depression and AH.   Attendees:  Signature: Thedore MinsMojeed Akintayo, MD 09/15/2013 8:27 AM   Signature: Richelle Itood Gretchen Weinfeld, LCSW 09/15/2013 8:27 AM  Signature: Fransisca KaufmannLaura Davis, NP 09/15/2013 8:27 AM  Signature: Joslyn Devonaroline Beaudry, RN 09/15/2013 8:27 AM  Signature: Liborio NixonPatrice White, RN 09/15/2013 8:27 AM  Signature:  09/15/2013 8:27 AM  Signature:   09/15/2013 8:27 AM  Signature:    Signature:    Signature:    Signature:    Signature:    Signature:      Scribe for Treatment Team:   Richelle Itood Nieves Chapa, LCSW  09/15/2013 8:27 AM

## 2013-09-15 NOTE — Progress Notes (Signed)
Assessed pt's hand, no open cuts, knuckles bruised, pt able to move all fingers and wrist without restriction or pain.

## 2013-09-15 NOTE — [facility] Group Notes (Signed)
[facility] LCSW Group Therapy  09/15/2013 1:15 pm  Type of Therapy: Process Group Therapy  Participation Level:  Joshua Franco made a dramatic entrance about half way into group.  When I nor none of the other patients was distracted from our conversation to focus on him, he immediately left.      Summary of Progress/Problems: Today's group addressed the issue of overcoming obstacles.  Patients were asked to identify their biggest obstacle post d/c that stands in the way of their on-going success, and then problem solve as to how to manage this.  Joshua Franco, Joshua Franco B 09/15/2013   3:23 PM

## 2013-09-15 NOTE — [facility] Group Notes (Signed)
[facility]   09/15/2013 11:16 AM  Participation Quality:  Engaged  Mood/Affect:  Appropriate  Depression Rating:  10  Anxiety Rating:  10  Thoughts of Suicide:  Yes Will you contract for safety?   Yes  Current AVH:  Yes  Plan for Discharge/Comments:  Joshua KernShianne states he is positive for all symptoms.  "I need a medication adjustment.  I am not able to go home in this shape."  He is wearing a blue headband today; acting like he is tough.    Transportation Means: [facility]  Supports: [facility]  Ida Rogueodney B [name]

## 2013-09-15 NOTE — Care Management Utilization Note (Signed)
   Per State Regulation 482.30  This chart was reviewed for necessity with respect to the patient's Admission/ Duration of stay.  Next review date: 09/18/13  Tonica Brasington Morrison RN, BSN 

## 2013-09-15 NOTE — Progress Notes (Signed)
Pt became aggressive/violent, punched window on door to nurses station and broke glass, AC/AD/Director called to unit, took pt to consult room, verbally deescalated pt, MD notified, MD explained to pt that police would be called if violent behavior continued, pt calmed down, states that he understands, pt calm and smiling now.

## 2013-09-15 NOTE — Progress Notes (Signed)
Pt behavior currently calm and cooperative, talked with pt about outside privileges and expectations for good behavior while outside, pt verbalized understanding, states "I'm gonna be good", pt taking his workbook outside to work on.

## 2013-09-16 ENCOUNTER — Encounter (HOSPITAL_COMMUNITY): Payer: Self-pay | Admitting: Psychiatry

## 2013-09-16 LAB — LITHIUM LEVEL: Lithium Lvl: 0.51 mEq/L — ABNORMAL LOW (ref 0.80–1.40)

## 2013-09-16 LAB — GLUCOSE, CAPILLARY: Glucose-Capillary: 93 mg/dL (ref 70–99)

## 2013-09-16 MED ORDER — TRAZODONE HCL 100 MG PO TABS
100.0000 mg | ORAL_TABLET | Freq: Every evening | ORAL | Status: DC | PRN
  Administered 2013-09-16: 100 mg via ORAL
  Filled 2013-09-16: qty 3

## 2013-09-16 MED ORDER — IBUPROFEN 200 MG PO TABS: 600.0000 mg | ORAL_TABLET | Freq: Three times a day (TID) | ORAL | Status: DC | PRN

## 2013-09-16 MED ORDER — ARIPIPRAZOLE ER 400 MG IM SUSR: 400.0000 mg | INTRAMUSCULAR | Status: DC

## 2013-09-16 NOTE — Progress Notes (Signed)
Patient ID: Ernst BreachShianne Scheck, male   DOB: 10/18/1990, 23 y.o.   MRN: [id number]   D: Pt was observed pacing the hall by the writer. Pt mumbled, went into the dayroom and hit papers around that were on the table, then walked over to the window and got behind the curtains. Writer called to the pt and had him follow her to his room. Pt stated that "they were saying that he was lying". When asked who "they" were, pt called 2 male peers names. Stated they were calling him gay and saying that he lied about a male peer being gay. Pt then informed the writer that he's supposed to be dating one of the male peers. Stated that he could "slap them like the voices were saying but that he doesn't want to hit women". Writer commended pt for having restraint and encouraged him to overlook the comments. Pt stated, "I'm going to go and apologize".  Pt walked into the dayroom and apologized to both women. Writer attempted to discourage pt from "dating" a peer.  A:  Support and encouragement was offered. 15 min checks continued for safety.  R: Pt remains safe.

## 2013-09-16 NOTE — Progress Notes (Addendum)
Patient ID: Joshua Franco Franco, male   DOB: 08/09/1990, 23 y.o.   [id number] [facility]  09/16/2013 9:53 AM Joshua Franco  MRN:  [id number] Subjective:   Patient states "I am still hearing voices, feeling suicidal but less irritable today.''  Objective:  Patient is seen and chart is reviewed. He remains paranoid, thinking that the people at his group home are going to poison him. However, he is endorsing decreased mood swings, racing thought, auditory hallucinations and suicidal thoughts. He is a little calm today but his behavior could be unpredictable, he punched and broke a door glass yesterday in anger. Patient is intellectually challenged and has difficulty processing information. He demonstrates attention seeking behaviors all the time. He received a short of Abillify maintenna yesterday to address his psychosis/delusions. He is rating his  depression/anxiety at 8/10 today. He had a conflict with his roommate yesterday and had to be moved to another room. He has difficulty getting along some of his peers. However, he is compliant with his medications denying any adverse effects. Lithium level on 09/16/13 is 0.51  Diagnosis:   DSM5:  Trauma-Stressor Disorders:  Posttraumatic Stress Disorder (309.81) Total Time spent with patient: 30 minutes  Axis I: Bipolar, Depressed and Post Traumatic Stress Disorder Axis II: Mental retardation, severity unknown Axis III:  Past Medical History  Diagnosis Date  . Diabetes mellitus without complication   . MR (mental retardation)   . Obesity   . GERD (gastroesophageal reflux disease)   . Hyperlipidemia   . Hyperactive   . HTN (hypertension)   . Obesity hypoventilation syndrome 05/06/2013  . 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   HENT: Negative.   Eyes: Negative.   Respiratory: Negative.   Cardiovascular: Negative.   Gastrointestinal: Negative.   Genitourinary: Negative.   Musculoskeletal: Negative.   Skin: Negative.   Neurological: Negative.   Endo/Heme/Allergies: Negative.   Psychiatric/Behavioral: Positive for depression, suicidal ideas and hallucinations. The patient is nervous/anxious and has insomnia.     Blood pressure 136/88, pulse 95, temperature 97.4 F (36.3 C), temperature source Oral, resp. rate 20, height 5' 7.32" (1.71 m), weight 117.935 kg (260 lb), SpO2 100.00%.Body mass index is 40.33 kg/(m^2).  General Appearance: Casual  Eye Contact::  Good  Speech:  Normal Rate  Volume:  Normal  Mood:  Euthymic  Affect:  Congruent  Thought Process:  Coherent  Orientation:  Full (Time, Place, and Person)  Thought Content:  Hallucinations: Auditory  Suicidal Thoughts:  No  Homicidal Thoughts:  No  Memory:  Immediate;   Good Recent;   Good Remote;   Good  Judgement:  Fair  Insight:  Fair  Psychomotor Activity:  Normal  Concentration:  Fair  Recall:  F[employer]Fair  Fund of Knowledge:Fair  Language: Fair  Akathisia:  No  Handed:  Right  AIMS (if indicated):     Assets:  Housing Leisure Time Physical Health Resilience  Sleep:  Number of Hours: 5.75   Musculoskeletal: Strength & Muscle Tone: within normal limits Gait & Station: normal Patient leans: N/A  Current Medications: Current Facility-Administered Medications  Medication Dose Route Frequency Provider Last Rate Last  Dose  . acetaminophen (TYLENOL) tablet 650 mg  650 mg Oral Q6H PRN Fransisca Kaufmann, NP   650 mg at 09/10/13 1819  . alum & mag hydroxide-simeth (MAALOX/MYLANTA) 200-200-20 MG/5ML suspension 30 mL  30 mL Oral Q6H PRN Kerry Hough, PA-C      .  ARIPiprazole (ABILIFY) tablet 10 mg  10 mg Oral BID PC Fransisca Kaufmann, NP   10 mg at 09/15/13 1817  . [START ON 10/13/2013] ARIPiprazole SUSR 400 mg  400 mg Intramuscular Q28 days Ramesha Poster      . benztropine (COGENTIN) tablet 1 mg  1 mg Oral BID Fransisca Kaufmann, NP   1 mg at 09/15/13 1654  . carvedilol (COREG) tablet 6.25 mg  6.25 mg Oral BID WC Nanine Means, NP   6.25 mg at 09/15/13 1655  . fenofibrate tablet 160 mg  160 mg Oral Daily Quentina Fronek   160 mg at 09/15/13 0809  . FLUoxetine (PROZAC) capsule 20 mg  20 mg Oral Daily Rosanna Bickle   20 mg at 09/15/13 0809  . hydrOXYzine (ATARAX/VISTARIL) tablet 50 mg  50 mg Oral TID PRN Thresa Ross, MD   50 mg at 09/15/13 0810  . ibuprofen (ADVIL,MOTRIN) tablet 600 mg  600 mg Oral Q8H PRN Mc Hollen      . lithium carbonate (ESKALITH) CR tablet 450 mg  450 mg Oral BID PC Reighlyn Elmes   450 mg at 09/15/13 1816  . metFORMIN (GLUCOPHAGE) tablet 1,000 mg  1,000 mg Oral BID WC Nanine Means, NP   1,000 mg at 09/15/13 1654  . OLANZapine zydis (ZYPREXA) disintegrating tablet 10 mg  10 mg Oral Q8H PRN Fransisca Kaufmann, NP   10 mg at 09/15/13 2311  . pantoprazole (PROTONIX) EC tablet 20 mg  20 mg Oral BID Nanine Means, NP   20 mg at 09/15/13 1654  . prazosin (MINIPRESS) capsule 2 mg  2 mg Oral QHS Kenda Kloehn   2 mg at 09/15/13 2141  . simvastatin (ZOCOR) tablet 20 mg  20 mg Oral QPM Nanine Means, NP   20 mg at 09/15/13 1816  . traZODone (DESYREL) tablet 100 mg  100 mg Oral QHS PRN Spring San        Lab Results:  Results for orders placed during the hospital encounter of 09/08/13 (from the past 48 hour(s))  GLUCOSE, CAPILLARY     Status: None   Collection Time    09/15/13  7:48 AM      Result Value Ref Range   Glucose-Capillary 85  70 - 99 mg/dL  GLUCOSE, CAPILLARY     Status: None   Collection Time    09/15/13 11:50 AM      Result Value Ref Range   Glucose-Capillary 99  70 - 99 mg/dL  LITHIUM LEVEL     Status: Abnormal   Collection  Time    09/16/13  6:20 AM      Result Value Ref Range   Lithium Lvl 0.51 (*) 0.80 - 1.40 mEq/L   Comment: Performed at [facility]  GLUCOSE, CAPILLARY     Status: None   Collection Time    09/16/13  6:25 AM      Result Value Ref Range   Glucose-Capillary 93  70 - 99 mg/dL    Physical Findings: AIMS: Facial and Oral Movements Muscles of Facial Expression: None, normal Lips and Perioral Area: None, normal Jaw: None, normal Tongue: None, normal,Extremity Movements Upper (arms, wrists, hands, fingers): None, normal Lower (  legs, knees, ankles, toes): None, normal, Trunk Movements Neck, shoulders, hips: None, normal, Overall Severity Severity of abnormal movements (highest score from questions above): None, normal Incapacitation due to abnormal movements: None, normal Patient's awareness of abnormal movements (rate only patient's report): No Awareness, Dental Status Current problems with teeth and/or dentures?: No Does patient usually wear dentures?: No  CIWA:    COWS:     Treatment Plan Summary: Daily contact with patient to assess and evaluate symptoms and progress in treatment Medication management  Plan:  Review of chart, vital signs, medications, and notes. 1-Individual and group therapy 2-Medication management for depression and anxiety: -Continue Abilify 10 mg  BID for psychosis. -Continue  Cogentin 1 mg BID for EPS prevention -Continue Minipress 2 mg hs for PTSD -Continue Trazodone 100 mg as needed for insomnia -Continue  Lithium CR 450 mg bid for mood stabilization -Continue Zyprexa Zydis 10 mg every eight hours prn agitation -Continue Prozac 20mg  daily for PTSD -Received Abilify Maintena 400 mg IM on 09/15/13 next dose on 10/13/13  3-Coping skills for depression, psychosis 4-Continue crisis stabilization and management 5-Address health issues--monitoring vital signs, stable 6-Treatment plan in progress to prevent relapse of depression and chronic  mental illness 7-Do not admit due to unpredictable and aggressive behaviors   Medical Decision Making Problem Points:  Established problem, worsening (2), Review of last therapy session (1) and Review of psycho-social stressors (1) Data Points:  Order Aims Assessment (2) Review of medication regiment & side effects (2) Review of new medications or change in dosage (2)  I certify that inpatient services furnished can reasonably be expected to improve the patient's condition.   Felicidad Sugarman,MD 09/16/2013, 9:53 AM

## 2013-09-16 NOTE — Progress Notes (Signed)
Patient ID: Joshua BreachShianne Franco, male   DOB: 11/07/1990, 23 y.o.   MRN: [id number] D: patient continues to exhibit attention seeking behaviors.  He is disruptive to the milieu.  He continues to express suicidal thoughts.  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  He denies any HI/AVH.  He came to staff earlier stating he had a nose bleed.  Patient was advised to hold towel against his nose and it would eventually stop.  Patient asked, "there's blood all in the laundry basket."  Patient's nose eventually stopped bleeding.  Patient has been looking for ways to obtain attention from staff and his peers.  A: continue to monitor medication management and MD orders.  Safety checks every 15 minutes per protocol.  R: patient needs frequent redirection from staff.

## 2013-09-16 NOTE — [facility] Group Notes (Signed)
[facility] LCSW Group Therapy  09/16/2013 , 12:53 PM   Type of Therapy:  Group Therapy  Participation Level:  Joshua Franco was in group for the first 8 minutes.  He was engaged in whispering to another patient.  When confronted on this, he left.  Towards the end of group, he returned with his eyes hidden in a washcloth.  Sat on the floor by the window, and appeared to be looking for attention.  I ignored him.  He was not disruptive, but did not participate.  Summary of Progress/Problems: Today's group focused on the term Diagnosis.  Participants were asked to define the term, and then pronounce whether it is a negative, positive or neutral term.  Joshua Franco, Joshua Franco 09/16/2013 , 12:53 PM

## 2013-09-16 NOTE — [facility] Group Notes (Signed)
[facility] Group Notes:    Date:  09/16/2013  Time:  9:38 AM  Type of Therapy:  Psychoeducational Skills  Participation Level:  Did Not Attend  Participation Quality:  Inattentive  Affect:  Flat  Cognitive:  Lacking  Insight:  None  Engagement in Group:  None  Modes of Intervention:  Discussion  Summary of Progress/Problems:pt did not attend  Nicole CellaJanet Guyes Gracyn Santillanes 09/16/2013, 9:38 AM

## 2013-09-16 NOTE — [facility] Group Notes (Signed)
Adult Psychoeducational Group Note  Date:  09/16/2013 Time:  8:38 PM  Group Topic/Focus:  Wrap-Up Group:   The focus of this group is to help patients review their daily goal of treatment and discuss progress on daily workbooks.  Participation Level:  Minimal  Participation Quality:  Appropriate  Affect:  Flat  Cognitive:  Alert  Insight: Limited  Engagement in Group:  Limited  Modes of Intervention:  Discussion  Additional Comments:  Lucretia KernShianne stated his day was kind of good.  He said he talked to the doctor and got new medications.  He also expressed that he wasn't going to take his medications because they are not helping him.  He also said he is scheduled for discharge next week.  Upon discharge, he said he's going back to street racing, smoking weed and having fun.  Paiten Boies A Lillia AbedLindsay 09/16/2013, 8:38 PM

## 2013-09-16 NOTE — Progress Notes (Signed)
Patient ID: Joshua BreachShianne Furgeson, male   DOB: 11/29/1990, 23 y.o.   MRN: [id number]  D: After the introduction the writer asked the pt about his day. Pt stated, "I was upset with Dr [facility]". Pt informed the writer that he "bust the window", lifting his hand to show writer the redness and abrasions on his right knuckles. Stated, "everybody came running". [facility] to report that he came to see what was happening. Writer encouraged pt to speak before acting, because he could get hurt or hurt someone else.  A:  Support and encouragement was offered. 15 min checks continued for safety.  R: Pt remains safe.

## 2013-09-17 LAB — GLUCOSE, CAPILLARY: Glucose-Capillary: 101 mg/dL — ABNORMAL HIGH (ref 70–99)

## 2013-09-17 MED ORDER — NICOTINE POLACRILEX 2 MG MT GUM
2.0000 mg | CHEWING_GUM | OROMUCOSAL | Status: DC | PRN
  Administered 2013-09-18 – 2013-09-19 (×6): 2 mg via ORAL
  Filled 2013-09-17 (×6): qty 1

## 2013-09-17 NOTE — [facility] Suicide Risk Assessment (Signed)
[facility] INPATIENT:  Family/Significant Other Suicide Prevention Education  Suicide Prevention Education:  Patient Discharged to Other Healthcare Facility:  Suicide Prevention Education Not Provided:[facility]  I spoke to Penney FarmsKeith at [phone] who is very aware of Captain's issues, and will continue to keep him safe while also addressing manipulation issues.  The patient is discharging to another healthcare facility for continuation of treatment.  The patient's medical information, including suicide ideations and risk factors, are a part of the medical information shared with the receiving healthcare facility.  Ida RogueRodney B Jacynda Brunke 09/17/2013, 12:58 PM

## 2013-09-17 NOTE — Progress Notes (Addendum)
Patient ID: Joshua BreachShianne Culbertson, male   DOB: 09/22/1990, 23 y.o.   MRN: [id number] 1:1 observation note:  Patient put on 1:1 due to prior disruptive behaviors on the hall.  Patient's room has items all over the floor; playing cards were strown about.  He has been cooperative with staff.  He requested nicorette gum and was denied due patient does not smoke. He has been observed pacing the hallway.  He is restless with sad affect; depressed mood.  He expresses passive SI; denies HI/AVH.  Continue to observe and redirect patient as necessary.

## 2013-09-17 NOTE — Progress Notes (Signed)
1:1 notes  09/17/2013 @ 2000 D: Patient sitting in hallway talking to another pt. Pt reports talking to that particular pt calm him.  Pt reports ways to to calm himself talking to staff, and drinking gatorade . Pt denies SI/HI/AVH.  No sign of distress noted at this time A: 1:1 observation for safety. Medication given as ordered. Verbal support given and encourage to attend groups. R: Patient is safe. Sitter by pt side. 1:1 continues

## 2013-09-17 NOTE — Progress Notes (Signed)
Patient ID: Joshua Franco, male   DOB: 04/06/1991, 23 y.o.   MRN: [id number] 1:1 observation note:  Patient has been cooperative with staff today.  He complains of hearing voices and extreme anxiety.  Patient was given zyprexa 10 po for same.  He has been interacting appropriately with the staff and his peers.  He remains passively suicidal and reports auditory hallucinations.  1:1 continued for redirection.

## 2013-09-17 NOTE — [facility] Group Notes (Signed)
[facility]  09/17/2013  12:24 PM  Type of Therapy:  [employer] Presentation   Participation Level:  Active  Participation Quality:  Appropriate  Affect:  Flat  Cognitive:  Alert  Insight:  Improving  Engagement in Therapy:  Improving  Modes of Intervention:  Discussion, Education and Socialization   Summary of Progress/Problems:  Onalee HuaDavid from [employer] came to present his recovery story and play the guitar.  He stated that his substance use has caused his voices to increase.  He left after 10 minutes and came back for the last 15 minutes of group.  Nivin asked the speaker if the [facility] has any PTSD groups.  He shared that a couple of days ago his voices commanded him to hurt people.  He reacted to the voices by hitting the window.  He reported that he has not heard any voices for the last two days, and if they come back, his plan is to tell them to go to hell.  He thanked the speaker for playing his guitar.    Simona Huhina Yang   09/17/2013  12:24 PM

## 2013-09-17 NOTE — Progress Notes (Signed)
Patient ID: Joshua BreachShianne Pola, male   DOB: 12/24/1990, 23 y.o.   MRN: [id number] 1:1 observation note: patient on 1:1 observation due MD order.  Patient has been disruptive to the milieu by his attention seeking behaviors.  He requires frequent redirection by nurses and MHTs.  Patient has been calm and cooperative this morning.  He was upset because he is not able to go to the cafeteria or recreation time.  Informed patient that his behavior will be monitored today by [facility] and nursing staff.  Patient was told that MD will reevaluate the need for his observation tomorrow.  Patient was agreeable to nursing staff's suggestions and he has been appropriate and cooperative.  He is sitting the hallway chatting with the staff.  He is passively suicidal; denies any HI/AVH.

## 2013-09-17 NOTE — Progress Notes (Signed)
Patient ID: Joshua Franco Erpelding, male   DOB: 01/05/1991, 23 y.o.   MR[facility]: [id number] [facility]  09/17/2013 1:52 PM Joshua Franco Zeitz  MRN:  [id number] Subjective:   Patient states "I am not feeling suicidal today. But I am depressed. I see dead bodies that look like zombies. I got upset when people called me gay. But I just went to my room to chill out. I have been a little sleepy since I got my new medication in a shot."   Objective:  Patient is seen and chart is reviewed. Patient is endorsing decreased mood swings, racing thought, auditory hallucinations and suicidal thoughts.  Patient is intellectually challenged and has difficulty processing information. Nursing staff report that the patient was very disruptive to the unit yesterday evening. Patient became angry after he felt another peer called him gay. He has been pacing in the halls and threatening to hit a window. Patient also had thoughts to slap his peers but refrained from doing so. His room was noted to be very disorderly with playing cards all over the floor. It was reported by staff that after patient became angry that he threw things around his room. The patient's mood continues to be very labile. During his follow up assessment today the patient downplayed all reported information with staff. Lucretia KernShianne insisted that he had actually not caused any problems prior and was behaving much better. He was upset over having a 1:1 observations. The patient has no insight into how his behaviors create chaos on the unit.   Diagnosis:   DSM5:  Trauma-Stressor Disorders:  Posttraumatic Stress Disorder (309.81) Total Time spent with patient: 25 minutes  Axis I: Bipolar, Depressed and Post Traumatic Stress Disorder Axis II: Mental retardation, severity unknown Axis III:  Past Medical History  Diagnosis Date  . Diabetes mellitus without complication   . MR (mental retardation)   . Obesity   . GERD (gastroesophageal reflux disease)   .  Hyperlipidemia   . Hyperactive   . HTN (hypertension)   . Obesity hypoventilation syndrome 05/06/2013  . 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   HENT: Negative.   Eyes: Negative.   Respiratory: Negative.   Cardiovascular: Negative.   Gastrointestinal: Negative.   Genitourinary: Negative.   Musculoskeletal: Negative.   Skin: Negative.   Neurological: Negative.   Endo/Heme/Allergies: Negative.   Psychiatric/Behavioral: Positive for depression and hallucinations. Negative for suicidal ideas, memory loss and substance abuse. The patient is nervous/anxious and has insomnia.     Blood pressure 120/74, pulse 109, temperature 98.3 F (36.8 C), temperature source Oral, resp. rate 16, height 5' 7.32" (1.71 m), weight 117.935 kg (260 lb), SpO2 100.00%.Body mass index is 40.33 kg/(m^2).  General Appearance: Casual  Eye Contact::  Good  Speech:  Normal Rate  Volume:  Normal  Mood:  Anxious  Affect:  Congruent  Thought Process:  Coherent  Orientation:  Full (Time, Place, and Person)  Thought Content:  Delusions and Hallucinations: Auditory Visual  Suicidal Thoughts:  No  Homicidal Thoughts:  No  Memory:  Immediate;   Good Recent;   Good Remote;   Good  Judgement:  Fair  Insight:  Fair  Psychomotor  Activity:  Normal  Concentration:  Fair  Recall:  [employer] of Knowledge:Fair  Language: Fair  Akathisia:  No  Handed:  Right  AIMS (if indicated):     Assets:  Housing Leisure Time Physical Health Resilience  Sleep:  Number of Hours: 5.25   Musculoskeletal: Strength & Muscle Tone: within normal limits Gait & Station:  normal Patient leans: N/A  Current Medications: Current Facility-Administered Medications  Medication Dose Route Frequency Provider Last Rate Last Dose  . acetaminophen (TYLENOL) tablet 650 mg  650 mg Oral Q6H PRN Fransisca Kaufmann, NP   650 mg at 09/10/13 1819  . alum & mag hydroxide-simeth (MAALOX/MYLANTA) 200-200-20 MG/5ML suspension 30 mL  30 mL Oral Q6H PRN Kerry Hough, PA-C      . ARIPiprazole (ABILIFY) tablet 10 mg  10 mg Oral BID PC Fransisca Kaufmann, NP   10 mg at 09/17/13 1050  . [START ON 10/13/2013] ARIPiprazole SUSR 400 mg  400 mg Intramuscular Q28 days Taelon Bendorf      . benztropine (COGENTIN) tablet 1 mg  1 mg Oral BID Fransisca Kaufmann, NP   1 mg at 09/17/13 1051  . carvedilol (COREG) tablet 6.25 mg  6.25 mg Oral BID WC Nanine Means, NP   6.25 mg at 09/17/13 1050  . fenofibrate tablet 160 mg  160 mg Oral Daily Delight Bickle   160 mg at 09/17/13 1051  . FLUoxetine (PROZAC) capsule 20 mg  20 mg Oral Daily Shawnika Pepin   20 mg at 09/17/13 1051  . hydrOXYzine (ATARAX/VISTARIL) tablet 50 mg  50 mg Oral TID PRN Thresa Ross, MD   50 mg at 09/17/13 0357  . ibuprofen (ADVIL,MOTRIN) tablet 600 mg  600 mg Oral Q8H PRN Glendal Cassaday      . lithium carbonate (ESKALITH) CR tablet 450 mg  450 mg Oral BID PC Aeris Hersman   450 mg at 09/17/13 1051  . metFORMIN (GLUCOPHAGE) tablet 1,000 mg  1,000 mg Oral BID WC Nanine Means, NP   1,000 mg at 09/17/13 1051  . OLANZapine zydis (ZYPREXA) disintegrating tablet 10 mg  10 mg Oral Q8H PRN Fransisca Kaufmann, NP   10 mg at 09/16/13 2054  . pantoprazole (PROTONIX) EC tablet 20 mg  20 mg Oral BID Nanine Means, NP   20 mg at 09/17/13 1051  . prazosin (MINIPRESS) capsule 2 mg  2 mg Oral QHS Teruko Joswick   2 mg at 09/16/13 2054  . simvastatin (ZOCOR) tablet 20 mg  20 mg Oral QPM Nanine Means, NP   20 mg at 09/16/13 1846  . traZODone (DESYREL) tablet 100 mg  100 mg Oral QHS PRN Fed Ceci   100 mg at 09/16/13 2054    Lab Results:  Results for orders  placed during the hospital encounter of 09/08/13 (from the past 48 hour(s))  LITHIUM LEVEL     Status: Abnormal   Collection Time    09/16/13  6:20 AM      Result Value Ref Range   Lithium Lvl 0.51 (*) 0.80 - 1.40 mEq/L   Comment: Performed at [facility]  GLUCOSE, CAPILLARY     Status: None   Collection Time    09/16/13  6:25 AM      Result Value Ref Range   Glucose-Capillary 93  70 - 99 mg/dL  GLUCOSE, CAPILLARY     Status: Abnormal   Collection Time    09/17/13  6:15 AM      Result Value Ref  Range   Glucose-Capillary 101 (*) 70 - 99 mg/dL    Physical Findings: AIMS: Facial and Oral Movements Muscles of Facial Expression: None, normal Lips and Perioral Area: None, normal Jaw: None, normal Tongue: None, normal,Extremity Movements Upper (arms, wrists, hands, fingers): None, normal Lower (legs, knees, ankles, toes): None, normal, Trunk Movements Neck, shoulders, hips: None, normal, Overall Severity Severity of abnormal movements (highest score from questions above): None, normal Incapacitation due to abnormal movements: None, normal Patient's awareness of abnormal movements (rate only patient's report): No Awareness, Dental Status Current problems with teeth and/or dentures?: No Does patient usually wear dentures?: No  CIWA:    COWS:     Treatment Plan Summary: Daily contact with patient to assess and evaluate symptoms and progress in treatment Medication management  Plan:  Review of chart, vital signs, medications, and notes. 1-Individual and group therapy 2-Medication management for depression and anxiety: -Continue Abilify 10 mg  BID for psychosis. -Continue  Cogentin 1 mg BID for EPS prevention -Continue Minipress 2 mg hs for PTSD -Continue Trazodone 100 mg as needed for insomnia -Continue  Lithium CR 450 mg bid for mood stabilization -Continue Zyprexa Zydis 10 mg every eight hours prn agitation -Continue Prozac 20mg  daily for PTSD -Received  Abilify Maintena 400 mg IM on 09/15/13 next dose on 10/13/13  3-Coping skills for depression, psychosis 4-Continue crisis stabilization and management 5-Address health issues--monitoring vital signs, stable 6-Treatment plan in progress to prevent relapse of depression and chronic mental illness 7-Do not admit due to unpredictable and aggressive behaviors  8-Initiate 1:1 observation due to unpredictable and aggressive behaviors  9-Anticipate discharge back to group home on 09/19/13.   Medical Decision Making Problem Points:  Established problem, worsening (2), Review of last therapy session (1) and Review of psycho-social stressors (1) Data Points:  Order Aims Assessment (2) Review or order clinical lab tests (1) Review of medication regiment & side effects (2)  I certify that inpatient services furnished can reasonably be expected to improve the patient's condition.   Fransisca KaufmannLaura Davis, NP-C 09/17/2013, 1:52 PM   Patient seen, evaluated and I agree with notes by Nurse Practitioner. Thedore MinsMojeed Tabrina Esty, MD

## 2013-09-17 NOTE — Progress Notes (Signed)
The focus of this group is to help patients review their daily goal of treatment and discuss progress on daily workbooks. Pt attended the evening group session and responded to all discussion prompts from the Writer. Pt reported having had a good day on the unit, but feels he has been unfairly placed on a 1:1. "I'm going to show them today, tonight and tomorrow how I don't need this." Pt stated that upon discharge, he planned to take better care of himself by regaining custody of his 5yo son from his ex-girlfriend, whom Pt claimed had "kidnapped" their child. This did not match Pt's comments just prior to group, which were that his Mother was currently watching said child for him. The Writer did not address this discrepancy in group before Pt's peers in order to keep from agitating the Pt. Pt's affect was appropriate in group.

## 2013-09-18 LAB — GLUCOSE, CAPILLARY: GLUCOSE-CAPILLARY: 101 mg/dL — AB (ref 70–99)

## 2013-09-18 NOTE — Progress Notes (Signed)
[facility] Post 1:1 Observation Documentation  For the first (8) hours following discontinuation of 1:1 precautions, a progress note entry by nursing staff should be documented at least every 2 hours, reflecting the patient's behavior, condition, mood, and conversation.  Use the progress notes for additional entries.  Time 1:1 discontinued:  1015am  Patient's Behavior:  Pt calm, cooperative, pleasant and smiling appropriately  Patient's Condition:  No distress, no complaints at this time  Patient's Conversation:  Denies SI/HI/AVH, contracts for safety.  Alfonse SpruceAndrea Brooke Thorne 09/18/2013, 6:25 PM

## 2013-09-18 NOTE — Progress Notes (Signed)
1:1 Note- Pt up in dayroom, interacting with patients and staff appropriately, denies SI/HI/AVH, requesting to come off 1:1, explained to patient that MD needs to see him this am and make that decision, patient verbalized understanding, no other complaints at this time, 1:1 observation continued for safety.

## 2013-09-18 NOTE — Progress Notes (Signed)
D: Patient standing in hallway talking  with staff on approach. Pt stated he is doing well and plans to attend [employer] this fall and enroll in four classes. Pt thought process is disorganized and behavior is appropriate. Pt denies SI/HI/AVH and pain. Pt plans to continue using coping skills. Pt attended evening karaoke and engaged in activities. Cooperative with assessment. No acute distressed noted at this time.   A: Met with pt 1:1. Medications administered as prescribed. Praise and encouragement for using coping skills throughout the day. Writer encouraged pt to discuss feelings. Pt encouraged to come to staff with any questions or concerns.   R: Patient is safe on the unit. He is complaint with medications and denies any adverse reaction. Continue current POC.

## 2013-09-18 NOTE — Progress Notes (Signed)
Patient ID: Joshua BreachShianne Franco, male   DOB: 06/14/1990, 23 y.o.   MRN: [id number] 1:1 notes  09/18/2013 @ 0100 D: Patient in bed sleeping. Respiration regular and unlabored. No sign of distress noted at this time A: 1:1 observation for safety R: Patient remains asleep. Sitter at bedside. 1:1 continues

## 2013-09-18 NOTE — Progress Notes (Signed)
[facility] Post 1:1 Observation Documentation  For the first (8) hours following discontinuation of 1:1 precautions, a progress note entry by nursing staff should be documented at least every 2 hours, reflecting the patient's behavior, condition, mood, and conversation.  Use the progress notes for additional entries.  Time 1:1 discontinued:  1015am  Patient's Behavior:  Pt went outside with group for recreational activity  Patient's Condition:  No complaints at this time, no distress, stable  Patient's Conversation:  Pt off floor, when pt returned denies SI/HI/AVH, no needs expressed  Alfonse SpruceAndrea Brooke Thorne 09/18/2013, 5:24 PM

## 2013-09-18 NOTE — Progress Notes (Signed)
Patient ID: Joshua BreachShianne Plotz, male   DOB: 03/01/1991, 23 y.o.   MRN: [id number] 1:1 notes  09/18/2013 @ 0500 D: Patient in bed sleeping. Respiration regular and unlabored. No sign of distress noted at this time A: 1:1 observation for safety R: Patient remains asleep. Sitter at bedside. 1:1 continues

## 2013-09-18 NOTE — Progress Notes (Signed)
[facility] Post 1:1 Observation Documentation  For the first (8) hours following discontinuation of 1:1 precautions, a progress note entry by nursing staff should be documented at least every 2 hours, reflecting the patient's behavior, condition, mood, and conversation.  Use the progress notes for additional entries.  Time 1:1 discontinued:  1015am  Patient's Behavior:  Pt left group early, in room now writing songs, calm and cooperative  Patient's Condition:  Stable, no distress or complaints at this time  Patient's Conversation:  Denies SI/HI/AVH, is looking forward to singing Karaoke tonight  Joshua Franco 09/18/2013, 2:16 PM

## 2013-09-18 NOTE — Care Management Utilization Note (Signed)
   Per State Regulation 482.30  This chart was reviewed for necessity with respect to the patient's Admission/ Duration of stay.  Next review date: 09/21/13  Szymon Foiles Morrison RN, BSN 

## 2013-09-18 NOTE — Progress Notes (Signed)
[facility] Post 1:1 Observation Documentation  For the first (8) hours following discontinuation of 1:1 precautions, a progress note entry by nursing staff should be documented at least every 2 hours, reflecting the patient's behavior, condition, mood, and conversation.  Use the progress notes for additional entries.  Time 1:1 discontinued:  1015am  Patient's Behavior:  Calm, cooperative, in dayroom  Patient's Condition:  No complaints, no distress  Patient's Conversation:  ChiropractorAsked writer if he needed to take any medication before lunch, denies SI/HI/AVH at this time  Alfonse Sprucendrea Brooke Thorne 09/18/2013, 12:43 PM

## 2013-09-18 NOTE — [facility] Group Notes (Signed)
[facility] Group Notes:  (Counselor/Nursing/MHT/Case Management/Adjunct)  09/18/2013 1:15PM  Type of Therapy:  Group Therapy  Participation Level:  Active  Participation Quality:  Appropriate  Affect:  Flat  Cognitive:  Oriented  Insight:  Limited  Engagement in Group:  Limited  Engagement in Therapy:  Limited  Modes of Intervention:  Discussion, Exploration and Socialization  Summary of Progress/Problems: The topic for group was balance in life.  Pt participated in the discussion about when their life was in balance and out of balance and how this feels.  Pt discussed ways to get back in balance and short term goals they can work on to get where they want to be. Jamail stayed in tune with the group for the first 15 minutes before leaving.  While with us, he shared that he felt unbalanced yesterday because it was his son't birthday and he has not seen him in 6 years.  It is unclear whether this is true or a fabrication.  Went on to say he feels balanced today because he feels calm, not angry.  And when he needs help calming down, talking to others is the best way to achieve that goal.   Ida Rogueorth, Tayonna Bacha B 09/18/2013 2:29 PM

## 2013-09-18 NOTE — Progress Notes (Signed)
[facility] Post 1:1 Observation Documentation  For the first (8) hours following discontinuation of 1:1 precautions, a progress note entry by nursing staff should be documented at least every 2 hours, reflecting the patient's behavior, condition, mood, and conversation.  Use the progress notes for additional entries.  Time 1:1 discontinued:  1015  Patient's Behavior:  Calm, interacting appropriately with staff and other patients in dayroom  Patient's Condition:  Stable, no distress, no complaints at this time  Patient's Conversation:  Denies SI/HI/AVH, looking forward to "being off 1:1" Pt wants to be able to go outside and to cafeteria for meals with he other patients  Joshua Sprucendrea Brooke Franco 09/18/2013, 10:44 AM
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
## 2013-09-18 NOTE — Progress Notes (Signed)
D:  Per pt self inventory pt reports sleeping fair, appetite good, energy level low, ability to pay attention improving, rates depression at an 8 out of 10 and hopelessness at a 2 out of 10, denies SI/HI/AVH.     A:  Emotional support provided, Encouraged pt to continue with treatment plan and attend all group activities, q15 min checks maintained for safety.  R:  Pt is calm and cooperative with staff, interacting with other patients appropriately, pleasant during interaction, going to some groups, needs encouragement.

## 2013-09-19 LAB — GLUCOSE, CAPILLARY: Glucose-Capillary: 100 mg/dL — ABNORMAL HIGH (ref 70–99)

## 2013-09-19 LAB — LITHIUM LEVEL: Lithium Lvl: 0.57 mEq/L — ABNORMAL LOW (ref 0.80–1.40)

## 2013-09-19 MED ORDER — BENZTROPINE MESYLATE 1 MG PO TABS: 1.0000 mg | ORAL_TABLET | Freq: Two times a day (BID) | ORAL | Status: AC

## 2013-09-19 MED ORDER — SIMVASTATIN 20 MG PO TABS: 20.0000 mg | ORAL_TABLET | Freq: Every evening | ORAL | Status: DC

## 2013-09-19 MED ORDER — ARIPIPRAZOLE 10 MG PO TABS: 10.0000 mg | ORAL_TABLET | Freq: Two times a day (BID) | ORAL | Status: DC

## 2013-09-19 MED ORDER — LITHIUM CARBONATE ER 450 MG PO TBCR: 450.0000 mg | EXTENDED_RELEASE_TABLET | Freq: Two times a day (BID) | ORAL | Status: AC

## 2013-09-19 MED ORDER — FENOFIBRATE MICRONIZED 134 MG PO CAPS: 134.0000 mg | ORAL_CAPSULE | Freq: Every day | ORAL | Status: DC

## 2013-09-19 MED ORDER — CARVEDILOL 6.25 MG PO TABS: 6.2500 mg | ORAL_TABLET | Freq: Two times a day (BID) | ORAL | Status: DC

## 2013-09-19 MED ORDER — ARIPIPRAZOLE ER 400 MG IM SUSR: 400.0000 mg | INTRAMUSCULAR | Status: DC

## 2013-09-19 MED ORDER — FLUOXETINE HCL 20 MG PO CAPS: 20.0000 mg | ORAL_CAPSULE | Freq: Every day | ORAL | Status: AC

## 2013-09-19 MED ORDER — PANTOPRAZOLE SODIUM 20 MG PO TBEC: 20.0000 mg | DELAYED_RELEASE_TABLET | Freq: Two times a day (BID) | ORAL | Status: DC

## 2013-09-19 MED ORDER — TRAZODONE HCL 100 MG PO TABS: 100.0000 mg | ORAL_TABLET | Freq: Every evening | ORAL | Status: AC | PRN

## 2013-09-19 MED ORDER — METFORMIN HCL 500 MG PO TABS: 1000.0000 mg | ORAL_TABLET | Freq: Two times a day (BID) | ORAL | Status: DC

## 2013-09-19 MED ORDER — PRAZOSIN HCL 2 MG PO CAPS: 2.0000 mg | ORAL_CAPSULE | Freq: Every day | ORAL | Status: DC
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
## 2013-09-19 NOTE — Progress Notes (Signed)
[facility] :  Will you be returning to the same living situation after discharge: Yes,  returning to group home At discharge, do you have transportation home?:Yes,  group home will pick pt up Do you have the ability to pay for your medications:Yes,  provided pt with prescriptions and group home will assist pt with getting meds  Release of information consent forms completed and in the chart;  Patient's signature needed at discharge.  Patient to Follow up at: Follow-up Information   Follow up with [facility]  On 09/23/2013. (At 10am on Tuesday.  Go see your therapist, Jon BillingsHannah Tron, for your hospital follow-up appointment.  )    Contact information:   [address]  [place]  [phone]      Patient denies SI/HI:   Yes,  denies SI/HI    Aeronautical engineerafety Planning and Suicide Prevention discussed:  Yes,  discussed with pt and Mellody DanceKeith at pt's group home.  See suicide prevention education note.   Merrin Mcvicker N Horton 09/19/2013, 9:37 AM

## 2013-09-19 NOTE — Progress Notes (Signed)
D. Met with patient with Dr. Darleene Cleaver, Leonia Reader, assigned RN, Junius Argyle and Demetrios Loll from patient's group home and Zenon Mayo, Care Coordinator from his assigned Local Management Entity to discuss patient's history, course of recent treatment and discharge plans.  Discussed patient's now reported increasing vague threats to harm self as he admitted to thinking he needed "long-term care".  Patient could not state any reasons he felt long-term care would be beneficial or what he would like to work on if he remained inpatient.  Discussed with patient and his workers suggestions for [place] to help them set up possible DBT (Dialectical Behavior Therapy) through the [facility] and to have a full evaluation from a licensed psychologist to possibly help patient and them develop a workable behavior plan.  Discussed patient's history of doing para-suicidal behaviors when he was upset with his Mother or family or often when he felt like they were not giving him the attention he would like to have from them.  Discussed patient's history of responding to positive reinforcements as this RN worked with patient in outpatient for several years in the past. Met with patient individually after while discharge paperwork being completed and challenged patient to work with his group home staff as they were invested in his treatment.  Discussed patient's potential to reach so many personal goals as long-term care would keep him from meeting those goals.  Patient admitted still some SI/HI thoughts but denied any current plans or intent.  Also met with group home staff and care coordinator to encourage staff to develop short term goals to help patient be successful upon returning home and to attempt to keep him safe during periods of impulsivity.  A. Patient to still be discharged today and RN to review discharge plans.  Dr. Darleene Cleaver discussed all he had done with patient's medications through his  treatment and positive results as patient reported no SI/HI over the past 3 days and had remained quite active in the milieu and doing groups, cafeteria trips and off unit activities without any problems or concerns noted.  Patient to continue with all ordered discharge medications and group home to follow up with discharge provider appointments.  Care Coordinator agreed to work with staff to set up DBT and possible a behavior plan.  Group home staff agreed to plans to work with patient to develop short and long term reward system plans to encourage him to continue to not self-harm or to harm others.  R. Patient being discharged back to group home placement.  Patient was leaving unit with no further complaints, telling other peers by when he left, giving "fist bumps" and smiling as he walked off the unit.  Encouraged patient to keep group home staff informed of any thoughts and/or plans to harm-self or others if they arised?

## 2013-09-19 NOTE — Progress Notes (Signed)
Patient ID: Joshua Franco, male   DOB: [birth year], 23 y.o.   MRN: [id number] D. Patient presents with anxious mood, affect congruent. Jasn in am very attention seeking,anxious stating to writer '' I just don't want to go home. I want to go to [facility]. I'm hearing voices and seeing things and I'm having thoughts of killing myself. I don't want to leave. '' Pt  Later assessed with Probation officer and MD (as Probation officer relayed above information to Dr. Darleene Franco.) and patient denied hearing any auditory or visual hallucinations. He does not appear to be responding to any internal stimuli and has been visible in the milieu joking with peers.  He did admit to having anxiety over discharge, and as discharging time approached patient increased with attention seeking and manipulative behaviors (taking pencil stating he would stab self and then trying to cut self with plastic bracelet. ) A. Dr. Darleene Franco, and assistant Director S. Lovena Le RN as well as group home staff met with patient to facilitate discharge plan with increased supports. During which time it was discussed patients continued suicidal gestures and attention seeking behaviors as chronic, and encouraged staff Joshua Franco from [facility] as well as case manager to follow up with DBT and therapy.)  Patient and care workers provided with FL2, AVS with follow up care and RX as well as three day free supply medications. All belongings returned. Patient did at end of meeting contract for safety stating '' I'm going to try to do better this time'' and affect bright at discharge. No signs of acute decompensation at discharge. Pt escorted to lobby with staff to the care of group home staff member.

## 2013-09-19 NOTE — [facility] Suicide Risk Assessment (Signed)
   Demographic Factors:  Male, Caucasian, Low socioeconomic status and lives in group home  Total Time spent with patient: 20 minutes  Psychiatric Specialty Exam: Physical Exam  Psychiatric: He has a normal mood and affect. His speech is normal and behavior is normal. Judgment and thought content normal. Cognition and memory are normal.    Review of Systems  Constitutional: Negative.   HENT: Negative.   Eyes: Negative.   Respiratory: Negative.   Cardiovascular: Negative.   Gastrointestinal: Negative.   Genitourinary: Negative.   Musculoskeletal: Negative.   Skin: Negative.   Neurological: Negative.   Endo/Heme/Allergies: Negative.   Psychiatric/Behavioral: Negative.     Blood pressure 112/67, pulse 101, temperature 98.1 F (36.7 C), temperature source Oral, resp. rate 16, height 5' 7.32" (1.71 m), weight 117.935 kg (260 lb), SpO2 100.00%.Body mass index is 40.33 kg/(m^2).  General Appearance: Fairly Groomed  Patent attorneyye Contact::  Good  Speech:  Clear and Coherent and Normal Rate  Volume:  Normal  Mood:  Euthymic  Affect:  Appropriate  Thought Process:  Circumstantial  Orientation:  Full (Time, Place, and Person)  Thought Content:  Negative  Suicidal Thoughts:  No  Homicidal Thoughts:  No  Memory:  Immediate;   Fair Recent;   Fair Remote;   Fair  Judgement:  Fair  Insight:  Shallow  Psychomotor Activity:  Negative  Concentration:  Fair  Recall:  FiservFair  Fund of Knowledge:Fair  Language: Good  Akathisia:  No  Handed:  Right  AIMS (if indicated):     Assets:  Communication Skills Desire for Improvement Physical Health  Sleep:  Number of Hours: 4.5    Musculoskeletal: Strength & Muscle Tone: within normal limits Gait & Station: normal Patient leans: N/A   Mental Status Per Nursing Assessment::   On Admission:  Suicidal ideation indicated by patient  Current Mental Status by Physician: patient denies suicidal ideation, intent or plan  Loss Factors: Decrease in  vocational status  Historical Factors: Impulsivity  Risk Reduction Factors:   Sense of responsibility to family, Positive social support and lives in a group home setting  Continued Clinical Symptoms:  Resolving psychosis and depression  Cognitive Features That Contribute To Risk:  Closed-mindedness Polarized thinking    Suicide Risk:  Minimal: No identifiable suicidal ideation.  Patients presenting with no risk factors but with morbid ruminations; may be classified as minimal risk based on the severity of the depressive symptoms  Discharge Diagnoses:   AXIS I:  Schizoaffective disorder, unspecified condition              PTSD AXIS II:  Borderline IQ AXIS III:   Past Medical History  Diagnosis Date  . Diabetes mellitus without complication   . Obesity   . GERD (gastroesophageal reflux disease)   . Hyperlipidemia   . Hyperactive   . HTN (hypertension)   . Obesity hypoventilation syndrome 05/06/2013   AXIS IV:  other psychosocial or environmental problems and problems related to social environment AXIS V:  61-70 mild symptoms  Plan Of Care/Follow-up recommendations:  Activity:  as tolerated Diet:  healthy Tests:  Lithium level: 0.57 Other:  patient to keep his aftercare appointment  Is patient on multiple antipsychotic therapies at discharge:  No   Has Patient had three or more failed trials of antipsychotic monotherapy by history:  No  Recommended Plan for Multiple Antipsychotic Therapies: NA    Thedore MinsMojeed Jennavie Martinek, MD 09/19/2013, 9:04 AM

## 2013-09-19 NOTE — [facility] Group Notes (Signed)
[facility]   09/19/2013  8:45 AM  Participation Quality:  Did Not Attend - pt pacing in the hallway  Londyn Hotard Horton, LCSW 09/19/2013 9:19 AM

## 2013-09-19 NOTE — Tx Team (Signed)
Interdisciplinary Treatment Plan Update (Adult)  Date: 09/19/2013  Time Reviewed:  9:45 AM  Progress in Treatment: Attending groups: Yes Participating in groups:  Yes Taking medication as prescribed:  Yes Tolerating medication:  Yes Family/Significant othe contact made: Yes, with pt's group home Patient understands diagnosis:  Yes Discussing patient identified problems/goals with staff:  Yes Medical problems stabilized or resolved:  Yes Denies suicidal/homicidal ideation: Yes Issues/concerns per patient self-inventory:  Yes Other:  New problem(s) identified: N/A  Discharge Plan or Barriers: [facility] [place] and therapy.    Reason for Continuation of Hospitalization: Stable to d/c today  Comments: N/A  Estimated length of stay: D/C today  For review of initial/current patient goals, please see plan of care.  Attendees: Patient:  Joshua BreachShianne Jacquin 09/19/2013 9:34 AM   Family:     Physician:  Dr. Jannifer FranklinAkintayo 09/19/2013 9:34 AM   Nursing:   Enzo BiLinsey Squires, RN 09/19/2013 9:34 AM   Clinical Social Worker:  Reyes Ivanhelsea Horton, LCSW 09/19/2013 9:34 AM   Other: Nestor Ramproy Duncan, RN 09/19/2013 9:34 AM   Other: Fransisca KaufmannLaura Davis, NP 09/19/2013 9:34 AM   Other:   09/19/2013 9:34 AM   Other:     Other:    Other:    Other:    Other:    Other:      Scribe for Treatment Team:   Carmina MillerHorton, Kynzie Polgar Nicole, 09/19/2013 , 9:34 AM

## 2013-09-22 ENCOUNTER — Encounter: Payer: Self-pay | Admitting: Neurology

## 2013-09-23 ENCOUNTER — Telehealth: Payer: Self-pay | Admitting: *Deleted

## 2013-09-23 DIAGNOSIS — G4733 Obstructive sleep apnea (adult) (pediatric): Secondary | ICD-10-CM

## 2013-09-23 NOTE — Telephone Encounter (Addendum)
D/c CPAP due to medical non compliance is not a good choice in a patient who was hospitalized. i documented that in my last note and asked for the patient to increase his user time to at least 5-6 hours daily form immediately on.  If he is unable to do that, will d/c. CD

## 2013-09-23 NOTE — Telephone Encounter (Signed)
Mellody DanceKeith (caregiver) says patient is not using CPAP machine.  Pt has every excuse why he shouldn't use it.  He has tried to use it several nights but takes it off very early into the process and sounds as if he isn't committed to it at this time.  Up to this point, patient has not been able to use the machine for at least one full night. The patient has been hospitalized for past 3 weeks as an inpatient for his behavioral health.  He did not try to use it at all during that time.  It sounds like he has been struggling greatly and his priority at this time has not been on treating sleep apnea.  I advised Mellody DanceKeith that they need to know that if at some point in the future, Joshua Franco is ready to begin using CPAP therapy again, Medicaid will require a new face to face visit with the physician, a new sleep study, and starting the process over again.  I also let him know that we would be happy to help with desensitization to CPAP if needed or evaluation to determine if there is something that is keeping the patient from using therapy that we may assist with.  He verbalized understanding.  I will notify Dr. Vickey Hugerohmeier of the current situation and place orders to D/C CPAP against medical advice.  Mellody DanceKeith will come by today to pick up a copy of this order so that he can take the equipment back to [facility].  [facility] requires a D/C order in order to stop billing Medicaid for the equipment.

## 2013-09-24 NOTE — Progress Notes (Signed)
Patient Discharge Instructions:  After Visit Summary (AVS):   Faxed to:  09/24/13 Discharge Summary Note:   Faxed to:  09/24/13 Psychiatric Admission Assessment Note:   Faxed to:  09/24/13 Suicide Risk Assessment - Discharge Assessment:   Faxed to:  09/24/13 Faxed/Sent to the Next Level Care provider:  09/24/13 [facility] @ [phone]  Jerelene ReddenSheena E [place], 09/24/2013, 3:44 PM

## 2013-10-03 NOTE — Telephone Encounter (Signed)
He should be given 30 days to improve his compliance,other wise d/c CPAP and dismiss for non compliance.

## 2019-05-12 ENCOUNTER — Emergency Department: Payer: Medicare Other

## 2019-05-12 ENCOUNTER — Emergency Department
Admission: EM | Admit: 2019-05-12 | Discharge: 2019-05-12 | Disposition: A | Discharge status: Home / Self Care | Payer: Medicare Other | Attending: Emergency Medicine | Admitting: Emergency Medicine

## 2019-05-12 ENCOUNTER — Other Ambulatory Visit: Payer: Self-pay

## 2019-05-12 DIAGNOSIS — Z79899 Other long term (current) drug therapy: Secondary | ICD-10-CM | POA: Insufficient documentation

## 2019-05-12 DIAGNOSIS — Z7984 Long term (current) use of oral hypoglycemic drugs: Secondary | ICD-10-CM | POA: Insufficient documentation

## 2019-05-12 DIAGNOSIS — M25562 Pain in left knee: Secondary | ICD-10-CM | POA: Diagnosis present

## 2019-05-12 DIAGNOSIS — L02416 Cutaneous abscess of left lower limb: Secondary | ICD-10-CM | POA: Diagnosis not present

## 2019-05-12 DIAGNOSIS — I10 Essential (primary) hypertension: Secondary | ICD-10-CM | POA: Diagnosis not present

## 2019-05-12 DIAGNOSIS — F17210 Nicotine dependence, cigarettes, uncomplicated: Secondary | ICD-10-CM | POA: Diagnosis not present

## 2019-05-12 DIAGNOSIS — E119 Type 2 diabetes mellitus without complications: Secondary | ICD-10-CM | POA: Diagnosis not present

## 2019-05-12 LAB — BASIC METABOLIC PANEL
Anion gap: 8 (ref 5–15)
BUN: 14 mg/dL (ref 6–20)
CO2: 28 mmol/L (ref 22–32)
Calcium: 9.2 mg/dL (ref 8.9–10.3)
Chloride: 98 mmol/L (ref 98–111)
Creatinine, Ser: 0.56 mg/dL — ABNORMAL LOW (ref 0.61–1.24)
GFR calc Af Amer: >60 mL/min (ref >60)
GFR calc non Af Amer: >60 mL/min (ref >60)
Glucose, Bld: 132 mg/dL — ABNORMAL HIGH (ref 70–99)
Potassium: 4.3 mmol/L (ref 3.5–5.1)
Sodium: 134 mmol/L — ABNORMAL LOW (ref 135–145)

## 2019-05-12 LAB — CBC
HCT: 37.8 % — ABNORMAL LOW (ref 39.0–52.0)
Hemoglobin: 12.1 g/dL — ABNORMAL LOW (ref 13.0–17.0)
MCH: 29.2 pg (ref 26.0–34.0)
MCHC: 32 g/dL (ref 30.0–36.0)
MCV: 91.1 fL (ref 80.0–100.0)
Platelets: 329 10*3/uL (ref 150–400)
RBC: 4.15 MIL/uL — ABNORMAL LOW (ref 4.22–5.81)
RDW: 13.4 % (ref 11.5–15.5)
WBC: 16 10*3/uL — ABNORMAL HIGH (ref 4.0–10.5)
nRBC: 0 % (ref 0.0–0.2)

## 2019-05-12 LAB — LACTIC ACID, PLASMA: Lactic Acid, Venous: 1.6 mmol/L (ref 0.5–1.9)

## 2019-05-12 MED ORDER — SODIUM CHLORIDE 0.9 % IV BOLUS
1000.0000 mL | Freq: Once | INTRAVENOUS | Status: AC
  Administered 2019-05-12: 16:00:00 1000 mL via INTRAVENOUS

## 2019-05-12 MED ORDER — LITHIUM CARBONATE ER 450 MG PO TBCR
450.0000 mg | EXTENDED_RELEASE_TABLET | Freq: Two times a day (BID) | ORAL | Status: DC
  Administered 2019-05-12: 450 mg via ORAL
  Filled 2019-05-12: qty 1

## 2019-05-12 MED ORDER — CLINDAMYCIN PHOSPHATE 600 MG/50ML IV SOLN
600.0000 mg | Freq: Once | INTRAVENOUS | Status: AC
  Administered 2019-05-12: 600 mg via INTRAVENOUS
  Filled 2019-05-12: qty 50

## 2019-05-12 MED ORDER — LIDOCAINE-PRILOCAINE 2.5-2.5 % EX CREA
TOPICAL_CREAM | Freq: Once | CUTANEOUS | Status: AC
  Filled 2019-05-12: qty 5

## 2019-05-12 MED ORDER — LIDOCAINE-EPINEPHRINE 2 %-1:100000 IJ SOLN
20.0000 mL | Freq: Once | INTRAMUSCULAR | Status: AC
  Administered 2019-05-12: 16:00:00 20 mL via INTRADERMAL
  Filled 2019-05-12: qty 1

## 2019-05-12 MED ORDER — OXYCODONE HCL 5 MG PO TABS
5.0000 mg | ORAL_TABLET | Freq: Once | ORAL | Status: AC
  Administered 2019-05-12: 18:00:00 5 mg via ORAL
  Filled 2019-05-12: qty 1

## 2019-05-12 MED ORDER — OXYCODONE-ACETAMINOPHEN 5-325 MG PO TABS: 1.0000 | ORAL_TABLET | Freq: Three times a day (TID) | ORAL | 0 refills | Status: AC | PRN

## 2019-05-12 MED ORDER — CLINDAMYCIN HCL 300 MG PO CAPS: 300.0000 mg | ORAL_CAPSULE | Freq: Three times a day (TID) | ORAL | 0 refills | Status: AC

## 2019-05-12 NOTE — ED Provider Notes (Signed)
[facility] Emergency Department Provider Note ____________________________________________  Time seen: Approximately 3:43 PM  I have reviewed the triage vital signs and the nursing notes.   HISTORY  Chief Complaint Knee Pain and Wound Check    HPI Joshua Franco is a 28 y.o. male who presents to the emergency department for evaluation and treatment of left knee pain. He injured the knee 4 days ago and has been picking at it. Over the past 24-48 hours, the area has become very red and swollen. Today, the calf and ankle have also swollen. No fever noted. No alleviating measures prior to arrival.  Past Medical History:  Diagnosis Date  . Anxiety   . Anxiety disorder   . Bipolar 1 disorder (HCC)   . Depression   . Diabetes mellitus without complication (HCC)   . GERD (gastroesophageal reflux disease)   . HTN (hypertension)   . Hyperactive   . Hyperlipidemia   . MR (mental retardation)   . Obesity   . Obesity hypoventilation syndrome (HCC) 05/06/2013  . PTSD (post-traumatic stress disorder)   . Schizophrenia (HCC)   . 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    Patient Active Problem List   Diagnosis Date Noted  . Posttraumatic stress disorder 09/10/2013  . Schizoaffective disorder, unspecified condition 09/10/2013  . Bipolar 1 disorder, depressed (HCC) 09/09/2013  . Bipolar depression (HCC) 09/08/2013  . Suicidal ideation 09/07/2013  . Bipolar 1 disorder, depressed, severe (HCC) 08/30/2013  . Bipolar disorder with severe depression (HCC) 08/30/2013  . Obesity hypoventilation syndrome (HCC) 05/06/2013  . 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 Acute posttraumatic stress disorder 02/13/2013    Past Surgical History:  Procedure Laterality Date  . HAND SURGERY Left    2008    Prior to Admission medications   Medication Sig Start Date End Date Taking? Authorizing Provider  ARIPiprazole (ABILIFY MAINTENA) 400 MG SUSR Inject 400 mg into the muscle every 28  (twenty-eight) days. 10/13/13   Thermon Leyland, NP  ARIPiprazole (ABILIFY) 10 MG tablet Take 1 tablet (10 mg total) by mouth 2 (two) times daily after a meal. 09/19/13   Thermon Leyland, NP  benztropine (COGENTIN) 1 MG tablet Take 1 tablet (1 mg total) by mouth 2 (two) times daily. 09/19/13   Thermon Leyland, NP  carvedilol (COREG) 6.25 MG tablet Take 1 tablet (6.25 mg total) by mouth 2 (two) times daily with a meal. 09/19/13   Thermon Leyland, NP  clindamycin (CLEOCIN) 300 MG capsule Take 1 capsule (300 mg total) by mouth 3 (three) times daily for 10 days. 05/12/19 05/22/19  Kem Boroughs B, FNP  fenofibrate micronized (LOFIBRA) 134 MG capsule Take 1 capsule (134 mg total) by mouth daily before breakfast. 09/19/13   Thermon Leyland, NP  FLUoxetine (PROZAC) 20 MG capsule Take 1 capsule (20 mg total) by mouth daily. 09/19/13   Thermon Leyland, NP  lithium carbonate (ESKALITH) 450 MG CR tablet Take 1 tablet (450 mg total) by mouth 2 (two) times daily after a meal. 09/19/13   Thermon Leyland, NP  metFORMIN (GLUCOPHAGE) 500 MG tablet Take 2 tablets (1,000 mg total) by mouth 2 (two) times daily with a meal. 09/19/13   Thermon Leyland, NP  oxyCODONE-acetaminophen (PERCOCET) 5-325 MG tablet Take 1 tablet by mouth every 8 (eight) hours as needed for up to 5 days for severe pain. 05/12/19 05/17/19  Lounell Schumacher, Rulon Eisenmenger B, FNP  pantoprazole (PROTONIX) 20 MG tablet Take 1 tablet (20 mg total) by mouth 2 (two)  times daily. 09/19/13   Thermon Leyland, NP  prazosin (MINIPRESS) 2 MG capsule Take 1 capsule (2 mg total) by mouth at bedtime. 09/19/13   Thermon Leyland, NP  simvastatin (ZOCOR) 20 MG tablet Take 1 tablet (20 mg total) by mouth every evening. 09/19/13   Thermon Leyland, NP  traZODone (DESYREL) 100 MG tablet Take 1 tablet (100 mg total) by mouth at bedtime as needed for sleep. 09/19/13   Thermon Leyland, NP    Allergies Amoxicillin and Penicillins  Family History  Problem Relation Age of Onset  . Diabetes Father   . Diabetes Maternal  Grandmother     Social History Social History   Tobacco Use  . Smoking status: Current Every Day Smoker    Packs/day: 0.50    Years: 10.00    Pack years: 5.00    Types: Cigarettes  . Smokeless tobacco: Never Used  Substance Use Topics  . Alcohol use: No    Comment: Caffeine intake is heavy  . 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 Cardiovascular: Negative for chest pain. Respiratory: Negative for shortness of breath. Musculoskeletal: Positive for left knee pain. Skin: Positive for erythema, lesion, and swelling of the left knee.  Neurological: Negative for decrease in sensation  ____________________________________________   PHYSICAL EXAM:  VITAL SIGNS: ED Triage Vitals [05/12/19 1455]  Enc Vitals Group     BP 127/78     Pulse Rate (!) 111     Resp 18     Temp 99.1 F (37.3 C)     Temp Source Oral     SpO2 97 %     Weight 165 lb (74.8 kg)     Height  (1.778 m)     Head Circumference      Peak Flow      Pain Score 7     Pain Loc      Pain Edu?      Excl. in [place]?     Constitutional: Alert and oriented. Well appearing and in no acute distress. Eyes: Conjunctivae are clear without discharge or drainage Head: Atraumatic Neck: Supple Respiratory: No cough. Respirations are even and unlabored. Musculoskeletal: Limited flexion of the left knee secondary to pain and swelling. Neurologic: Motor and sensory function is intact.  Skin: Prepatellar surface of the left knee erythematous and swollen with a 5 to 6 cm fluctuant area on the lateral patella.  Remainder of the left lower extremity is also edematous but no erythema or pitting edema. Psychiatric: Affect and behavior are appropriate.  ____________________________________________   LABS (all labs ordered are listed, but only abnormal results are displayed)  Labs Reviewed  BASIC METABOLIC PANEL - Abnormal; Notable for the following  components:      Result Value   Sodium 134 (*)    Glucose, Bld 132 (*)    Creatinine, Ser 0.56 (*)    All other components within normal limits  CBC - Abnormal; Notable for the following components:   WBC 16.0 (*)    RBC 4.15 (*)    Hemoglobin 12.1 (*)    HCT 37.8 (*)    All other components within normal limits  CULTURE, BLOOD (ROUTINE X 2)  CULTURE, BLOOD (ROUTINE X 2)  LACTIC ACID, PLASMA  LACTIC ACID, PLASMA   ____________________________________________  RADIOLOGY  Possible small intra-articular fracture fragment projected over the patellofemoral joint.  There is a prominent prepatellar soft  tissue swelling with possible small posttraumatic joint effusion. ____________________________________________   PROCEDURES  .Marland Kitchen.Incision and Drainage  Date/Time: 05/12/2019 7:40 PM Performed by: Chinita Pesterriplett, Haik Mahoney B, FNP Authorized by: Chinita Pesterriplett, Ebba Goll B, FNP   Consent:    Consent obtained:  Verbal   Consent given by:  Patient   Risks discussed:  Bleeding, incomplete drainage, pain and infection   Alternatives discussed:  No treatment Universal protocol:    Procedure explained and questions answered to patient or proxy's satisfaction: yes     Relevant documents present and verified: yes     Test results available and properly labeled: yes     Imaging studies available: yes     Required blood products, implants, devices, and special equipment available: yes     Site/side marked: yes     Immediately prior to procedure a time out was called: yes     Patient identity confirmed:  Verbally with patient Location:    Type:  Abscess   Location:  Lower extremity   Lower extremity location:  Knee   Knee location:  L knee Pre-procedure details:    Skin preparation:  Betadine and Chloraprep Anesthesia (see MAR for exact dosages):    Anesthesia method:  Local infiltration and topical application   Topical anesthetic:  EMLA cream   Local anesthetic:  Lidocaine 2% WITH epi Procedure type:     Complexity:  Complex Procedure details:    Incision types:  Single straight   Incision depth:  Subcutaneous   Scalpel blade:  11   Wound management:  Probed and deloculated   Drainage:  Purulent (plus clotted blood)   Drainage amount:  Copious   Wound treatment:  Drain placed   Packing materials:  1/4 in iodoform gauze Post-procedure details:    Patient tolerance of procedure:  Tolerated well, no immediate complications    ____________________________________________   INITIAL IMPRESSION / ASSESSMENT AND PLAN / ED COURSE  Ernst BreachShianne Bessette is a 28 y.o. who presents to the emergency department for treatment and evaluation of the left knee pain, swelling, and lesion that has been progressively worsening over the past 4 days.  See HPI for further details.  ----------------------------------------- 4:25 PM on 05/12/2019 -----------------------------------------  White blood cell count is elevated at 16, labs are otherwise unremarkable. X-ray not concerning for osteomyelitis.  Results discussed with the patient and his guardian.  Plan at this point will be to apply EMLA cream over the area and allow to sit for half hour or so before I&D.  ----------------------------------------- 4:48 PM on 05/12/2019 -----------------------------------------  Caregiver requesting that patient receive his Lithium scheduled for 5pm. Pharmacy order placed and received. RN to administer.   ----------------------------------------- 5:37 PM on 05/12/2019 -----------------------------------------  Patient is to be discharged home.  Caregiver will ensure that he has a wound check in 2 days either primary care or will return here.  Wound care instructions were provided to both the patient and caregiver.  He will be placed on clindamycin and given a few Norco to be taken as needed for severe pain, otherwise he will take Tylenol for moderate amount of pain.  For any symptoms that worsen, he is to just  return to the emergency department immediately.  Understanding was verbalized.   Medications  lithium carbonate (ESKALITH) CR tablet 450 mg (450 mg Oral Given 05/12/19 1646)  sodium chloride 0.9 % bolus 1,000 mL (0 mLs Intravenous Stopped 05/12/19 1749)  clindamycin (CLEOCIN) IVPB 600 mg (0 mg Intravenous Stopped 05/12/19 1635)  lidocaine-prilocaine (EMLA) cream (  Topical Given 05/12/19 1628)  lidocaine-EPINEPHrine (XYLOCAINE W/EPI) 2 %-1:100000 (with pres) injection 20 mL (20 mLs Intradermal Given 05/12/19 1628)  oxyCODONE (Oxy IR/ROXICODONE) immediate release tablet 5 mg (5 mg Oral Given 05/12/19 1746)    Pertinent labs & imaging results that were available during my care of the patient were reviewed by me and considered in my medical decision making (see chart for details).  _________________________________________   FINAL CLINICAL IMPRESSION(S) / ED DIAGNOSES  Final diagnoses:  Cutaneous abscess of left knee    ED Discharge Orders         Ordered    clindamycin (CLEOCIN) 300 MG capsule  3 times daily     05/12/19 1740    oxyCODONE-acetaminophen (PERCOCET) 5-325 MG tablet  Every 8 hours PRN     05/12/19 1740           If controlled substance prescribed during this visit, 12 month history viewed on the [place] prior to issuing an initial prescription for Schedule II or III opiod.   Victorino Dike, FNP 05/12/19 1946    Nance Pear, MD 05/12/19 2102

## 2019-05-12 NOTE — Discharge Instructions (Signed)
Please be reevaluated in 2 days either by her primary care provider or return here.  Return sooner if your symptoms worsen.  Elevate your leg as often as possible throughout the next couple of days and apply a warm compress intermittently throughout the day.  If your gauze becomes saturated, please change it but try and leave the packing in place.  Take Tylenol for minor pain.  For severe pain, you have been given Norco.  Take it sparingly as it may cause dizziness and constipation.

## 2019-05-12 NOTE — ED Triage Notes (Signed)
Reports left knee pain and swelling since he fell and injured knee on Thursday. Pt has been reportedly picking at wound. Redness, swelling and tenderness present upon assessment.

## 2019-05-14 ENCOUNTER — Encounter: Payer: Self-pay | Admitting: Emergency Medicine

## 2019-05-14 ENCOUNTER — Other Ambulatory Visit: Payer: Self-pay

## 2019-05-14 ENCOUNTER — Emergency Department
Admission: EM | Admit: 2019-05-14 | Discharge: 2019-05-14 | Disposition: A | Discharge status: Home / Self Care | Payer: Medicare Other | Attending: Emergency Medicine | Admitting: Emergency Medicine

## 2019-05-14 DIAGNOSIS — Z79899 Other long term (current) drug therapy: Secondary | ICD-10-CM | POA: Diagnosis not present

## 2019-05-14 DIAGNOSIS — I10 Essential (primary) hypertension: Secondary | ICD-10-CM | POA: Diagnosis not present

## 2019-05-14 DIAGNOSIS — Z5189 Encounter for other specified aftercare: Secondary | ICD-10-CM | POA: Insufficient documentation

## 2019-05-14 DIAGNOSIS — E119 Type 2 diabetes mellitus without complications: Secondary | ICD-10-CM | POA: Diagnosis not present

## 2019-05-14 DIAGNOSIS — L02416 Cutaneous abscess of left lower limb: Secondary | ICD-10-CM | POA: Diagnosis not present

## 2019-05-14 DIAGNOSIS — F17210 Nicotine dependence, cigarettes, uncomplicated: Secondary | ICD-10-CM | POA: Insufficient documentation

## 2019-05-14 DIAGNOSIS — Z7984 Long term (current) use of oral hypoglycemic drugs: Secondary | ICD-10-CM | POA: Insufficient documentation

## 2019-05-14 NOTE — Discharge Instructions (Addendum)
Follow-up with your primary care provider if any continued problems.  Return to the emergency department over the holiday weekend if any worsening of your symptoms or urgent concerns.  Use warm moist compresses to the area frequently.  Elevate as needed for pain and swelling.  Also in 2 days you may take the dressing off and replace most likely with a Band-Aid at that time.  Continue taking antibiotics until completely finished.

## 2019-05-14 NOTE — ED Provider Notes (Signed)
[facility] Emergency Department Provider Note   ____________________________________________   First MD Initiated Contact with Patient 05/14/19 1035     (approximate)  I have reviewed the triage vital signs and the nursing notes.   HISTORY  Chief Complaint Wound Check    HPI Joshua Franco is a 28 y.o. male presents to the ED for packing removal.  Patient was seen in the ED on 05/12/2019 for a cutaneous abscess to the left knee.  Patient continues to take antibiotics without any difficulty.  He and caregiver are unaware of any fever, chills or increased swelling of the left knee.  Patient has a history of schizophrenia, mental retardation, diabetes and bipolar disease.  He rates his pain as a 9 out of 10.     Past Medical History:  Diagnosis Date  . Anxiety   . Anxiety disorder   . Bipolar 1 disorder (HCC)   . Depression   . Diabetes mellitus without complication (HCC)   . GERD (gastroesophageal reflux disease)   . HTN (hypertension)   . Hyperactive   . Hyperlipidemia   . MR (mental retardation)   . Obesity   . Obesity hypoventilation syndrome (HCC) 05/06/2013  . PTSD (post-traumatic stress disorder)   . Schizophrenia (HCC)   . 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    Patient Active Problem List   Diagnosis Date Noted  . Posttraumatic stress disorder 09/10/2013  . Schizoaffective disorder, unspecified condition 09/10/2013  . Bipolar 1 disorder, depressed (HCC) 09/09/2013  . Bipolar depression (HCC) 09/08/2013  . Suicidal ideation 09/07/2013  . Bipolar 1 disorder, depressed, severe (HCC) 08/30/2013  . Bipolar disorder with severe depression (HCC) 08/30/2013  . Obesity hypoventilation syndrome (HCC) 05/06/2013  . 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 Acute posttraumatic stress disorder 02/13/2013    Past Surgical History:  Procedure Laterality Date  . HAND SURGERY Left    2008    Prior to Admission medications   Medication Sig Start Date End Date  Taking? Authorizing Provider  ARIPiprazole (ABILIFY MAINTENA) 400 MG SUSR Inject 400 mg into the muscle every 28 (twenty-eight) days. 10/13/13   Thermon Leyland, NP  ARIPiprazole (ABILIFY) 10 MG tablet Take 1 tablet (10 mg total) by mouth 2 (two) times daily after a meal. 09/19/13   Thermon Leyland, NP  benztropine (COGENTIN) 1 MG tablet Take 1 tablet (1 mg total) by mouth 2 (two) times daily. 09/19/13   Thermon Leyland, NP  carvedilol (COREG) 6.25 MG tablet Take 1 tablet (6.25 mg total) by mouth 2 (two) times daily with a meal. 09/19/13   Thermon Leyland, NP  clindamycin (CLEOCIN) 300 MG capsule Take 1 capsule (300 mg total) by mouth 3 (three) times daily for 10 days. 05/12/19 05/22/19  Kem Boroughs B, FNP  fenofibrate micronized (LOFIBRA) 134 MG capsule Take 1 capsule (134 mg total) by mouth daily before breakfast. 09/19/13   Thermon Leyland, NP  FLUoxetine (PROZAC) 20 MG capsule Take 1 capsule (20 mg total) by mouth daily. 09/19/13   Thermon Leyland, NP  lithium carbonate (ESKALITH) 450 MG CR tablet Take 1 tablet (450 mg total) by mouth 2 (two) times daily after a meal. 09/19/13   Thermon Leyland, NP  metFORMIN (GLUCOPHAGE) 500 MG tablet Take 2 tablets (1,000 mg total) by mouth 2 (two) times daily with a meal. 09/19/13   Thermon Leyland, NP  oxyCODONE-acetaminophen (PERCOCET) 5-325 MG tablet Take 1 tablet by mouth every 8 (eight) hours as needed for up  to 5 days for severe pain. 05/12/19 05/17/19  Triplett, Johnette Abraham B, FNP  pantoprazole (PROTONIX) 20 MG tablet Take 1 tablet (20 mg total) by mouth 2 (two) times daily. 09/19/13   Niel Hummer, NP  prazosin (MINIPRESS) 2 MG capsule Take 1 capsule (2 mg total) by mouth at bedtime. 09/19/13   Niel Hummer, NP  simvastatin (ZOCOR) 20 MG tablet Take 1 tablet (20 mg total) by mouth every evening. 09/19/13   Niel Hummer, NP  traZODone (DESYREL) 100 MG tablet Take 1 tablet (100 mg total) by mouth at bedtime as needed for sleep. 09/19/13   Niel Hummer, NP     Allergies Amoxicillin and Penicillins  Family History  Problem Relation Age of Onset  . Diabetes Father   . Diabetes Maternal Grandmother     Social History Social History   Tobacco Use  . Smoking status: Current Every Day Smoker    Packs/day: 0.50    Years: 10.00    Pack years: 5.00    Types: Cigarettes  . Smokeless tobacco: Never Used  Substance Use Topics  . Alcohol use: No    Comment: Caffeine intake is heavy  . 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 Respiratory: Denies shortness of breath. Gastrointestinal: No abdominal pain.  No nausea, no vomiting.  Musculoskeletal: Left knee pain. Skin: Abscess left knee. Neurological: Negative for headaches, focal weakness or numbness. ___________________________________________   PHYSICAL EXAM:  VITAL SIGNS: ED Triage Vitals [05/14/19 1033]  Enc Vitals Group     BP      Pulse      Resp      Temp      Temp src      SpO2      Weight 164 lb 14.5 oz (74.8 kg)     Height      Head Circumference      Peak Flow      Pain Score 9     Pain Loc      Pain Edu?      Excl. in [place]?    Constitutional: Alert and oriented. Well appearing and in no acute distress. Eyes: Conjunctivae are normal.  Head: Atraumatic. Neck: No stridor.   Cardiovascular:  Good peripheral circulation. Respiratory: Normal respiratory effort.  No retractions. Musculoskeletal: On examination of the left knee there is a open area with iodoform gauze present.  Minimal drainage at present.  Gauze was removed and dressing was replaced. Neurologic:  Normal speech and language. No gross focal neurologic deficits are appreciated.  Skin:  Skin is warm, dry.  As noted above left knee. Psychiatric: Mood and affect are normal. Speech and behavior are normal.  ____________________________________________   LABS (all labs ordered are listed, but only abnormal  results are displayed)  Labs Reviewed - No data to display  PROCEDURES  Procedure(s) performed (including Critical Care):  Procedures Drain was removed by PA.  ____________________________________________   INITIAL IMPRESSION / ASSESSMENT AND PLAN / ED COURSE  As part of my medical decision making, I reviewed the following data within the electronic MEDICAL RECORD NUMBER Notes from prior ED visits and [place] Controlled Substance Database  28 year old male presents to the ED for packing removal after having an I&D of his left knee 2 days ago.  Patient continues to take the antibiotic without any difficulty.  Area showed evidence of drainage but no active drainage at the  time of dressing change.  Patient was encouraged to use warm compresses to the area and finish his antibiotic.  He is to return to the emergency department if any severe worsening of his symptoms or urgent concerns during the holiday weekend.  ____________________________________________   FINAL CLINICAL IMPRESSION(S) / ED DIAGNOSES  Final diagnoses:  Wound check, abscess     ED Discharge Orders    None       Note:  This document was prepared using Dragon voice recognition software and may include unintentional dictation errors.    Tommi RumpsSummers, Saidy Ormand L, PA-C 05/14/19 1406    Jene EveryKinner, Robert, MD 05/14/19 1432

## 2019-05-14 NOTE — ED Triage Notes (Signed)
Presents for wound check to left knee  Stats he had an abscess drained couple of days ago

## 2019-05-17 LAB — CULTURE, BLOOD (ROUTINE X 2)
Culture: NO GROWTH
Culture: NO GROWTH
Special Requests: ADEQUATE

## 2019-10-15 ENCOUNTER — Other Ambulatory Visit: Payer: Self-pay

## 2019-10-15 ENCOUNTER — Emergency Department: Payer: Medicare Other

## 2019-10-15 ENCOUNTER — Encounter: Payer: Self-pay | Admitting: Emergency Medicine

## 2019-10-15 ENCOUNTER — Observation Stay
Admission: EM | Admit: 2019-10-15 | Discharge: 2019-10-17 | Disposition: A | Discharge status: Home / Self Care | Payer: Medicare Other | Attending: Internal Medicine | Admitting: Internal Medicine

## 2019-10-15 DIAGNOSIS — F79 Unspecified intellectual disabilities: Secondary | ICD-10-CM | POA: Diagnosis not present

## 2019-10-15 DIAGNOSIS — F259 Schizoaffective disorder, unspecified: Secondary | ICD-10-CM | POA: Diagnosis not present

## 2019-10-15 DIAGNOSIS — S82401A Unspecified fracture of shaft of right fibula, initial encounter for closed fracture: Secondary | ICD-10-CM

## 2019-10-15 DIAGNOSIS — R2689 Other abnormalities of gait and mobility: Secondary | ICD-10-CM | POA: Insufficient documentation

## 2019-10-15 DIAGNOSIS — E785 Hyperlipidemia, unspecified: Secondary | ICD-10-CM | POA: Insufficient documentation

## 2019-10-15 DIAGNOSIS — Z7984 Long term (current) use of oral hypoglycemic drugs: Secondary | ICD-10-CM | POA: Insufficient documentation

## 2019-10-15 DIAGNOSIS — Z20822 Contact with and (suspected) exposure to covid-19: Secondary | ICD-10-CM | POA: Insufficient documentation

## 2019-10-15 DIAGNOSIS — IMO0002 Reserved for concepts with insufficient information to code with codable children: Secondary | ICD-10-CM

## 2019-10-15 DIAGNOSIS — R9389 Abnormal findings on diagnostic imaging of other specified body structures: Secondary | ICD-10-CM

## 2019-10-15 DIAGNOSIS — F319 Bipolar disorder, unspecified: Secondary | ICD-10-CM | POA: Diagnosis present

## 2019-10-15 DIAGNOSIS — M009 Pyogenic arthritis, unspecified: Secondary | ICD-10-CM

## 2019-10-15 DIAGNOSIS — Z880 Allergy status to penicillin: Secondary | ICD-10-CM | POA: Diagnosis not present

## 2019-10-15 DIAGNOSIS — E662 Morbid (severe) obesity with alveolar hypoventilation: Secondary | ICD-10-CM | POA: Diagnosis not present

## 2019-10-15 DIAGNOSIS — X79XXXA Intentional self-harm by blunt object, initial encounter: Secondary | ICD-10-CM | POA: Insufficient documentation

## 2019-10-15 DIAGNOSIS — F314 Bipolar disorder, current episode depressed, severe, without psychotic features: Secondary | ICD-10-CM | POA: Diagnosis present

## 2019-10-15 DIAGNOSIS — I10 Essential (primary) hypertension: Secondary | ICD-10-CM | POA: Insufficient documentation

## 2019-10-15 DIAGNOSIS — F17210 Nicotine dependence, cigarettes, uncomplicated: Secondary | ICD-10-CM | POA: Insufficient documentation

## 2019-10-15 DIAGNOSIS — E119 Type 2 diabetes mellitus without complications: Secondary | ICD-10-CM | POA: Diagnosis not present

## 2019-10-15 DIAGNOSIS — L03115 Cellulitis of right lower limb: Secondary | ICD-10-CM | POA: Diagnosis present

## 2019-10-15 DIAGNOSIS — Z79899 Other long term (current) drug therapy: Secondary | ICD-10-CM | POA: Insufficient documentation

## 2019-10-15 DIAGNOSIS — K219 Gastro-esophageal reflux disease without esophagitis: Secondary | ICD-10-CM | POA: Diagnosis not present

## 2019-10-15 DIAGNOSIS — F4312 Post-traumatic stress disorder, chronic: Secondary | ICD-10-CM | POA: Insufficient documentation

## 2019-10-15 DIAGNOSIS — S82831A Other fracture of upper and lower end of right fibula, initial encounter for closed fracture: Secondary | ICD-10-CM | POA: Diagnosis not present

## 2019-10-15 DIAGNOSIS — S9001XA Contusion of right ankle, initial encounter: Secondary | ICD-10-CM

## 2019-10-15 DIAGNOSIS — R4588 Nonsuicidal self-harm: Secondary | ICD-10-CM

## 2019-10-15 DIAGNOSIS — L02415 Cutaneous abscess of right lower limb: Secondary | ICD-10-CM | POA: Diagnosis not present

## 2019-10-15 LAB — COMPREHENSIVE METABOLIC PANEL
ALT: 80 U/L — ABNORMAL HIGH (ref 0–44)
AST: 36 U/L (ref 15–41)
Albumin: 3.2 g/dL — ABNORMAL LOW (ref 3.5–5.0)
Alkaline Phosphatase: 64 U/L (ref 38–126)
Anion gap: 11 (ref 5–15)
BUN: 16 mg/dL (ref 6–20)
CO2: 28 mmol/L (ref 22–32)
Calcium: 9.5 mg/dL (ref 8.9–10.3)
Chloride: 99 mmol/L (ref 98–111)
Creatinine, Ser: 0.57 mg/dL — ABNORMAL LOW (ref 0.61–1.24)
GFR calc Af Amer: >60 mL/min (ref >60)
GFR calc non Af Amer: >60 mL/min (ref >60)
Glucose, Bld: 103 mg/dL — ABNORMAL HIGH (ref 70–99)
Potassium: 4.3 mmol/L (ref 3.5–5.1)
Sodium: 138 mmol/L (ref 135–145)
Total Bilirubin: 0.5 mg/dL (ref 0.3–1.2)
Total Protein: 7.6 g/dL (ref 6.5–8.1)

## 2019-10-15 LAB — CBC WITH DIFFERENTIAL/PLATELET
Abs Immature Granulocytes: 0.02 10*3/uL (ref 0.00–0.07)
Basophils Absolute: 0 10*3/uL (ref 0.0–0.1)
Basophils Relative: 0 %
Eosinophils Absolute: 0.1 10*3/uL (ref 0.0–0.5)
Eosinophils Relative: 2 %
HCT: 35 % — ABNORMAL LOW (ref 39.0–52.0)
Hemoglobin: 11.4 g/dL — ABNORMAL LOW (ref 13.0–17.0)
Immature Granulocytes: 0 %
Lymphocytes Relative: 13 %
Lymphs Abs: 1 10*3/uL (ref 0.7–4.0)
MCH: 28.7 pg (ref 26.0–34.0)
MCHC: 32.6 g/dL (ref 30.0–36.0)
MCV: 88.2 fL (ref 80.0–100.0)
Monocytes Absolute: 0.5 10*3/uL (ref 0.1–1.0)
Monocytes Relative: 7 %
Neutro Abs: 5.8 10*3/uL (ref 1.7–7.7)
Neutrophils Relative %: 78 %
Platelets: 515 10*3/uL — ABNORMAL HIGH (ref 150–400)
RBC: 3.97 MIL/uL — ABNORMAL LOW (ref 4.22–5.81)
RDW: 13.3 % (ref 11.5–15.5)
WBC: 7.5 10*3/uL (ref 4.0–10.5)
nRBC: 0 % (ref 0.0–0.2)

## 2019-10-15 LAB — LACTIC ACID, PLASMA: Lactic Acid, Venous: 0.8 mmol/L (ref 0.5–1.9)

## 2019-10-15 LAB — SARS CORONAVIRUS 2 BY RT PCR (HOSPITAL ORDER, PERFORMED IN ~~LOC~~ HOSPITAL LAB): SARS Coronavirus 2: NEGATIVE

## 2019-10-15 MED ORDER — MORPHINE SULFATE (PF) 4 MG/ML IV SOLN
4.0000 mg | Freq: Once | INTRAVENOUS | Status: AC
  Administered 2019-10-15: 4 mg via INTRAVENOUS
  Filled 2019-10-15: qty 1

## 2019-10-15 MED ORDER — VANCOMYCIN HCL IN DEXTROSE 1-5 GM/200ML-% IV SOLN
1000.0000 mg | Freq: Once | INTRAVENOUS | Status: AC
  Administered 2019-10-15: 1000 mg via INTRAVENOUS
  Filled 2019-10-15: qty 200

## 2019-10-15 MED ORDER — SODIUM CHLORIDE 0.9 % IV BOLUS
1000.0000 mL | Freq: Once | INTRAVENOUS | Status: AC
  Administered 2019-10-15: 1000 mL via INTRAVENOUS

## 2019-10-15 NOTE — ED Notes (Signed)
Called [facility] transfer center for potential Ortho transfer.  Spoke with [name].  Images POWERSHARED by Missy Sabins.  Facesheet faxed.
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
## 2019-10-15 NOTE — ED Provider Notes (Signed)
[facility] Emergency Department Provider Note  ____________________________________________  Time seen: Approximately 4:14 PM  I have reviewed the triage vital signs and the nursing notes.   HISTORY  Chief Complaint Ankle Pain    HPI Joshua Franco is a 29 y.o. male who presents the emergency department with his legal guardian for complaint of ankle injury and possible infection.  According to the legal guardian, patient reported only was attempting to injure himself with a textbook.  Patient had been hitting himself with a book to his ankle.  According to the guardian, patient is schizoaffective, bipolar PTSD, depressed and has been fixated on coming to the hospital.  Patient's relative has been receiving a lot of attention due to her recent cancer diagnosis.  Patient has been fixated on needing to come to the hospital.  Cording to the legal guardian, there is no concern that patient was trying to commit suicide and there is no concern for homicidal or aggressive behaviors.  Reported that he had injured himself approximately 4 days ago.  When patient began to limp, complaining of increased pain, caregiver visualized his ankle and realized that there was gross erythema, edema.  There is concern for infection as patient had picked and dug at his knee joint last year  until he introduced an infection and required antibiotics for what apparently was a septic joint.  No fevers or chills.  Legal guardian is not concerned regarding patient's psychiatric status as this is patient's baseline.  Guardian has no concern that this was a suicidal attempt.  Patient has history of anxiety, bipolar disorder, depression, schizoaffective disorder, PTSD, hypertension, hyperlipidemia, MR, diabetes.        Past Medical History:  Diagnosis Date  . Anxiety   . Anxiety disorder   . Bipolar 1 disorder ([place])   . Depression   . Diabetes mellitus without complication ([place])   . GERD  (gastroesophageal reflux disease)   . HTN (hypertension)   . Hyperactive   . Hyperlipidemia   . MR (mental retardation)   . Obesity   . Obesity hypoventilation syndrome ([place]) 05/06/2013  . PTSD (post-traumatic stress disorder)   . Schizophrenia ([place])   . 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    Patient Active Problem List   Diagnosis Date Noted  . Posttraumatic stress disorder 09/10/2013  . Schizoaffective disorder, unspecified condition 09/10/2013  . Bipolar 1 disorder, depressed ([place]) 09/09/2013  . Bipolar depression ([place]) 09/08/2013  . Suicidal ideation 09/07/2013  . Bipolar 1 disorder, depressed, severe ([place]) 08/30/2013  . Bipolar disorder with severe depression ([place]) 08/30/2013  . Obesity hypoventilation syndrome ([place]) 05/06/2013  . 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 Acute posttraumatic stress disorder 02/13/2013    Past Surgical History:  Procedure Laterality Date  . HAND SURGERY Left    2008    Prior to Admission medications   Medication Sig Start Date End Date Taking? Authorizing Provider  ARIPiprazole (ABILIFY MAINTENA) 400 MG SUSR Inject 400 mg into the muscle every 28 (twenty-eight) days. 10/13/13   Niel Hummer, NP  ARIPiprazole (ABILIFY) 10 MG tablet Take 1 tablet (10 mg total) by mouth 2 (two) times daily after a meal. 09/19/13   Niel Hummer, NP  benztropine (COGENTIN) 1 MG tablet Take 1 tablet (1 mg total) by mouth 2 (two) times daily. 09/19/13   Niel Hummer, NP  carvedilol (COREG) 6.25 MG tablet Take 1 tablet (6.25 mg total) by mouth 2 (two) times daily with a meal. 09/19/13   Niel Hummer,  NP  fenofibrate micronized (LOFIBRA) 134 MG capsule Take 1 capsule (134 mg total) by mouth daily before breakfast. 09/19/13   Thermon Leylandavis, Laura A, NP  FLUoxetine (PROZAC) 20 MG capsule Take 1 capsule (20 mg total) by mouth daily. 09/19/13   Thermon Leylandavis, Laura A, NP  lithium carbonate (ESKALITH) 450 MG CR tablet Take 1 tablet (450 mg total) by mouth 2 (two) times daily after a meal. 09/19/13   Thermon Leylandavis,  Laura A, NP  metFORMIN (GLUCOPHAGE) 500 MG tablet Take 2 tablets (1,000 mg total) by mouth 2 (two) times daily with a meal. 09/19/13   Thermon Leylandavis, Laura A, NP  pantoprazole (PROTONIX) 20 MG tablet Take 1 tablet (20 mg total) by mouth 2 (two) times daily. 09/19/13   Thermon Leylandavis, Laura A, NP  prazosin (MINIPRESS) 2 MG capsule Take 1 capsule (2 mg total) by mouth at bedtime. 09/19/13   Thermon Leylandavis, Laura A, NP  simvastatin (ZOCOR) 20 MG tablet Take 1 tablet (20 mg total) by mouth every evening. 09/19/13   Thermon Leylandavis, Laura A, NP  traZODone (DESYREL) 100 MG tablet Take 1 tablet (100 mg total) by mouth at bedtime as needed for sleep. 09/19/13   Thermon Leylandavis, Laura A, NP    Allergies Amoxicillin and Penicillins  Family History  Problem Relation Age of Onset  . Diabetes Father   . Diabetes Maternal Grandmother     Social History Social History   Tobacco Use  . Smoking status: Current Every Day Smoker    Packs/day: 0.50    Years: 10.00    Pack years: 5.00    Types: Cigarettes  . Smokeless tobacco: Never Used  Substance Use Topics  . Alcohol use: No    Comment: Caffeine intake is heavy  . 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 No discharge ENT: No upper respiratory complaints. Cardiovascular: no chest pain. Respiratory: no cough. No SOB. Gastrointestinal: No abdominal pain.  No nausea, no vomiting.  No diarrhea.  No constipation. Musculoskeletal: Positive for right ankle pain and injury as well as concern for infection Skin: Negative for rash, abrasions, lacerations, ecchymosis. Neurological: Negative for headaches, focal weakness or numbness. 10-point ROS otherwise negative.  ____________________________________________   PHYSICAL EXAM:  VITAL SIGNS: ED Triage Vitals  Enc Vitals Group     BP 10/15/19 1458 113/82     Pulse Rate 10/15/19 1458 93     Resp 10/15/19 1458 20     Temp 10/15/19 1458 98.7 F (37.1 C)     Temp  Source 10/15/19 1458 Oral     SpO2 10/15/19 1458 100 %     Weight 10/15/19 1456 165 lb (74.8 kg)     Height 10/15/19 1456 5\' 7"  (1.702 m)     Head Circumference --      Peak Flow --      Pain Score 10/15/19 1456 9     Pain Loc --      Pain Edu? --      Excl. in GC? --      Constitutional: Alert and oriented. Well appearing and in no acute distress. Eyes: Conjunctivae are normal. PERRL. EOMI. Head: Atraumatic. ENT:      Ears:       Nose: No congestion/rhinnorhea.      Mouth/Throat: Mucous membranes are moist.  Neck: No stridor.    Cardiovascular: Normal rate, regular rhythm. Normal S1 and S2.  Good peripheral circulation. Respiratory: Normal  respiratory effort without tachypnea or retractions. Lungs CTAB. Good air entry to the bases with no decreased or absent breath sounds. Musculoskeletal: Full range of motion to all extremities. No gross deformities appreciated.  Visualization of the right ankle reveals a grossly edematous and erythematous ankle joint.  Patient has erosion of the top layer of skin consistent with reported history of picking at the ankle.  Patient with significant fluctuance and induration.  Erythema, edema extends approximately 8 x 10 cm overlying the lateral malleolus.  Limited range of motion at this time.  Evaluation of the remainder of the right lower extremity is unremarkable.  Patient is tender to palpation over the lateral malleolus.  Dorsalis pedis pulse intact.  Sensation intact all digits. Neurologic:  Normal speech and language. No gross focal neurologic deficits are appreciated.  Skin:  Skin is warm, dry and intact. No rash noted. Psychiatric: Mood and affect are normal. Speech and behavior are normal. Patient exhibits appropriate insight and judgement.   ____________________________________________   LABS (all labs ordered are listed, but only abnormal results are displayed)  Labs Reviewed  COMPREHENSIVE METABOLIC PANEL - Abnormal; Notable for the  following components:      Result Value   Glucose, Bld 103 (*)    Creatinine, Ser 0.57 (*)    Albumin 3.2 (*)    ALT 80 (*)    All other components within normal limits  CBC WITH DIFFERENTIAL/PLATELET - Abnormal; Notable for the following components:   RBC 3.97 (*)    Hemoglobin 11.4 (*)    HCT 35.0 (*)    Platelets 515 (*)    All other components within normal limits  SARS CORONAVIRUS 2 BY RT PCR (HOSPITAL ORDER, PERFORMED IN [facility])  CULTURE, BLOOD (ROUTINE X 2)  CULTURE, BLOOD (ROUTINE X 2)  LACTIC ACID, PLASMA   ____________________________________________  EKG   ____________________________________________  RADIOLOGY I personally viewed and evaluated these images as part of my medical decision making, as well as reviewing the written report by the radiologist.  DG Ankle Complete Right  Result Date: 10/15/2019 CLINICAL DATA:  Right ankle injury. EXAM: RIGHT ANKLE - COMPLETE 3+ VIEW COMPARISON:  December 08, 2011. FINDINGS: Mildly displaced fracture is seen involving the distal fibula with overlying soft tissue swelling. Joint spaces are intact. IMPRESSION: Mildly displaced distal fibular fracture. Electronically Signed   By: Lupita Raider M.D.   On: 10/15/2019 15:45   [place] RT LOWER EXTREM LTD SOFT TISSUE NON VASCULAR  Result Date: 10/15/2019 CLINICAL DATA:  Erythema and edema overlying lateral malleolus. Concern for abscess. EXAM: ULTRASOUND RIGHT LOWER EXTREMITY LIMITED TECHNIQUE: Ultrasound examination of the lower extremity soft tissues was performed in the area of clinical concern. COMPARISON:  X-ray dated Oct 15 2019 FINDINGS: In the palpable area of concern, there is a 4 x 2 x 4.2 cm heterogeneous, relatively isoechoic mass overlying the lateral malleolus. This mass is superficial to the underlying bone and deep to the cutaneous surface. Multiple echogenic foci are noted with shadowing and questionable ring down artifact. There is some overlying skin  thickening and adjacent edema. IMPRESSION: The patient's palpable area of concern corresponds to a 4.2 cm heterogeneous relatively isoechoic avascular mass. Given the presence of multiple echogenic foci with possible ring down artifact, findings are suspicious for an abscess with multiple pockets of gas in the appropriate clinical setting. Other differential considerations include a large hematoma in the setting of trauma. Electronically Signed   By: Beryle Quant.D.  On: 10/15/2019 17:16    ____________________________________________    PROCEDURES  Procedure(s) performed:    Procedures    Medications  sodium chloride 0.9 % bolus 1,000 mL (0 mLs Intravenous Stopped 10/15/19 2043)  vancomycin (VANCOCIN) IVPB 1000 mg/200 mL premix (0 mg Intravenous Stopped 10/15/19 1747)  morphine 4 MG/ML injection 4 mg (4 mg Intravenous Given 10/15/19 2151)     ____________________________________________   INITIAL IMPRESSION / ASSESSMENT AND PLAN / ED COURSE  Pertinent labs & imaging results that were available during my care of the patient were reviewed by me and considered in my medical decision making (see chart for details).  Review of the National CSRS was performed in accordance of the [facility] prior to dispensing any controlled drugs.  Clinical Course as of Oct 16 54  Wed Oct 15, 2019  1620 Patient arrives with his legal guardian for complaint of injury and possible infection to the right ankle.  Cording to the legal guardian, patient has been fixated about coming to the hospital as a relative has been receiving attention given a recent diagnosis of cancer.  Patient reported to the caregiver that he had hit his ankle with a book over the weekend.  This was roughly 4 days ago.  According to the guardian the patient has apparently also been picking at his ankle trying to induce an infection.  Patient had a similar episode last year and had apparently a septic joint from same.  Guardian states  that this behavior is typical of the patient and she is not concerned for any suicidal or homicidal ideations.  She does not feel that psychiatry is needed at this time.  Findings on exam reveal distal fibular fracture by x-ray, significant erythema and edema concerning for septic joint to the lateral aspect of the ankle.   [JC]  [id number] Patient arrived to the emergency department with self-inflicted injury to the right ankle.  Patient had attempted to harm himself by using a heavy book to try and break his ankle.  Patient did not feel that he was successful in this and so has been digging at the ankle to the point that it has become infected.  Imaging reveals distal fibular fracture.  No physical exam findings concerning for infection ultrasound was also ordered to evaluate this area.  Patient has a 4 x 4.2 x 2 centimeter fluid collection with gas findings consistent with abscess overlying the distal fibula.  No evidence of penetration into the intra-articular surface.  Labs are reassuring at this time.  I discussed the case with our on-call orthopedic surgeon, Dr. Rosita Kea.  Dr. Rosita Kea feels that this does require OR washout, as well as likely wound VAC.  Orthopedics is extremely concerned as this was caused by the patient and the patient does not feel that he would likely be able to resist "messing with" surgical site or eventual wound VAC.  Orthopedics states that surgical washout is not the difficulty, it is the patient management after the fact.  Medicine is unable to manage the patient's psychiatric issues, our psychiatric service is unable to manage the patient's medical issues in the locked inpatient psychiatric ward.  At this time it is felt that the patient would require higher level of care at a tertiary center to be able to manage both the medical as well as psychiatric components of patient's care.   [JC]  2000 I discussed the results, our concerns with the patient and his guardian.  At this point, it is  felt  that patient would not be able to be managed purely medically.  Psychiatric consult is still pending, however the patient admits that if he had surgery, that "I would have to mess with it."     [JC]  2102 I discussed the patient with [facility].  They would be willing to accept the patient, however unfortunately they do not have the bed capability to take the patient at this time.   [JC]  2130 I discussed the patient with [facility] for transfer.  After discussing the patient with the transfer center, they advised that currently all 3 of their facilities were full and unable to accept the transfer.  They offered to place the patient on a waiting list pending approval by specialty service.  At this time I advised the transfer center at [facility] that I would talk to [facility] before being placed on the waiting list.  Advised to call back if we needed to place the patient on the waiting list.   [JC]  2205 I discussed the patient's history, exam, presentation with [facility].  After discussing with orthopedics, they advised that they would accept the patient as long as psychiatry would agree to manage the patient in the locked psychiatric unit.  This approval had to go through another service versus their transfer center.  Calling this number, there was no psychiatrist available to approve the patient for transfer and medical management in the psychiatric unit.  At this time, [facility] advised that they were unable to accept the patient as a transfer until psychiatry, then hospital/medicine service approval as well.  Currently, [facility] has a 48 to 54-hour wait list for medical service.   [JC]  2240 I discussed the patient with [facility] transfer center requesting to be placed on the wait list.  Orthopedic surgery discussed the patient, but advised that currently they are on complete diversion at [facility], would not be able to provide care for the patient at this time.   [JC]  2250 Patient has been evaluated by  psychiatry.  They agree that they feel that the patient is at further risk for self-harm.  They state that they feel that there is no management for the patient's current situation other than constant direct one-on-one supervision by a sitter.  They feel that the patient would likely further hurt himself and would likely worsen the patient's surgical site and care.  Advised that the patient should be again on 24-hour direct supervision.  Psychiatry advises that is long as patient has direct supervision he would not require locked psychiatric unit.   [JC]  2310 I contacted our orthopedic surgeon on-call, Dr. Rosita Kea about current status of the patient.  Advised that I had called [facility], [facility], [facility] and all were unable to accept the patient at this time.  I also discussed the recommendation from psychiatry that the patient could be managed on the floor with a direct one-on-one sitter.  At this time, Dr. Rosita Kea advises that he still does not feel comfortable performing a surgical procedure on this patient and request that we continue to try to transfer this patient.   [JC]  2330 Patient will be transferred from the start of the emergency department to the major side emergency department pending final disposition for the patient.  I discussed the patient with attending provider, Dr. Manson Passey.  I have advised the patient's history, physical exam, imaging and lab results, progress to this point.   [JC]    Clinical Course User Index [JC] Aryona Sill, Christiane Ha  D, PA-C          Patient's diagnosis is consistent with self-harm, closed fracture of the distal in the right fibula, abscess of the right ankle.  Patient presented to emergency department after attempting to hurt himself.  Patient has a fixation with being taken care of and coming to the hospital.  Patient has a family member that was recently diagnosed with cancer and has been fixated on coming to the hospital.  Patient attempted to fracture his ankle by smashing  a heavy textbook into his ankle repeatedly roughly 3 to 4 days ago.  Patient did not feel like he had succeeded in fracturing his ankle and there was no gross deformity to prompt guardian to bring the patient to the hospital.  Patient then further attempted to harm himself by introducing infection into the joint.  Patient has been scraping and poking into his joint with unknown objects.  Patient has a history of similar behavior in the past.  Patient was able to stick a piece of metal into the joint space of his right knee resulting in septic arthritis and lengthy admission.  Patient admits that he was attempting to harm himself so that he could come to the hospital.  Patient is also apparently fixated at having his leg amputated.  Apparently someone that the patient knows recently had an amputation and the patient would like a similar procedure performed on him.  Based off of the physical exam, there was concern for possible fracture as well as infection possibly even septic joint.  Initial imaging revealed fracture consistent with distal fibular fracture.  This was relatively minor and with no other complications could have been splinted.  Given the erythema and edema, evaluation with ultrasound was performed.  This reveals a large abscess but does not appear to involve the joint space.  Patient now has a large oval overlying abscess to the distal fibular fracture.  There is no frank laceration or open wound, but given patient's history and the fact that he has been sticking items into his joint would explain source of infection.  Given this information I discussed the case with attending provider, Dr. Erma Heritage who evaluated the patient as well.  I discussed the patient initially with our orthopedic surgeon who did not feel comfortable taking the patient to the operating room and subsequent care.  Surgeon feels that the washout, subsequent drain placement would be rather straightforward, however he is concerned  that the patient would complicate his care afterwards.  Patient admits that he would further injure himself specifically in regards to his postsurgical care by removing the drain, touching or "putting his fingers" into the wound.  Surgery did not feel comfortable performing a case that patient would likely be complicated by patients psychiatric conditions without significant psychiatric involvement.  There was concern that patient cannot be managed psychiatrically on the floor well enough to prevent further complications in regard to his postsurgical care.  Psychiatry did not feel comfortable managing the patient's medical issues in the locked psychiatry unit as they required that he be medically cleared for placement in the locked unit.Marland Kitchen  Psychiatry team advised that they feel that the patient would be at high risk for further self-harm, but not suicidal. They state that they feel that the patient would be able to be managed with a direct 24-hour one-on-one sitter.  After orthopedic surgery was hesitant to operate on the patient, they requested that we transfer the patient.  I have discussed the patient  with [facility], [facility], [facility] , all who are unable to accept the patient at this time as all 3 hospitals are full with no beds available..  Patient is resting comfortably, has received IV antibiotics, fluids.  At this time, I discussed the patient history, presentation, physical exam, course to this point with Dr. Manson Passey.  This area the emergency department is closing and patient will be moved to the major side of the emergency department.  Patient will have a one-on-one sitter.    ____________________________________________  FINAL CLINICAL IMPRESSION(S) / ED DIAGNOSES  Final diagnoses:  Infection of joint of ankle (HCC)  Non-suicidal self harm  Other closed fracture of distal end of right fibula, initial encounter      NEW MEDICATIONS STARTED DURING THIS VISIT:  ED Discharge Orders    None           This chart was dictated using voice recognition software/Dragon. Despite best efforts to proofread, errors can occur which can change the meaning. Any change was purely unintentional.    Racheal Patches, PA-C 10/16/19 0056    Darci Current, MD 10/16/19 [phone]

## 2019-10-15 NOTE — ED Notes (Signed)
Called [facility] transfer center for potential Ortho transfer.  Spoke with [name].

## 2019-10-16 ENCOUNTER — Encounter: Payer: Self-pay | Admitting: Registered Nurse

## 2019-10-16 ENCOUNTER — Encounter: Payer: Self-pay | Admitting: Internal Medicine

## 2019-10-16 ENCOUNTER — Encounter
Admission: EM | Disposition: A | Discharge status: Home / Self Care | Payer: Self-pay | Source: Home / Self Care | Attending: Internal Medicine

## 2019-10-16 DIAGNOSIS — R9389 Abnormal findings on diagnostic imaging of other specified body structures: Secondary | ICD-10-CM

## 2019-10-16 DIAGNOSIS — L03115 Cellulitis of right lower limb: Secondary | ICD-10-CM | POA: Diagnosis not present

## 2019-10-16 DIAGNOSIS — IMO0002 Reserved for concepts with insufficient information to code with codable children: Secondary | ICD-10-CM

## 2019-10-16 DIAGNOSIS — S82401A Unspecified fracture of shaft of right fibula, initial encounter for closed fracture: Secondary | ICD-10-CM

## 2019-10-16 DIAGNOSIS — S8261XA Displaced fracture of lateral malleolus of right fibula, initial encounter for closed fracture: Secondary | ICD-10-CM | POA: Diagnosis not present

## 2019-10-16 DIAGNOSIS — S82831A Other fracture of upper and lower end of right fibula, initial encounter for closed fracture: Secondary | ICD-10-CM | POA: Diagnosis not present

## 2019-10-16 DIAGNOSIS — S9001XA Contusion of right ankle, initial encounter: Secondary | ICD-10-CM

## 2019-10-16 DIAGNOSIS — L02415 Cutaneous abscess of right lower limb: Secondary | ICD-10-CM

## 2019-10-16 SURGERY — INCISION AND DRAINAGE
Anesthesia: Choice

## 2019-10-16 MED ORDER — VANCOMYCIN HCL 1500 MG/300ML IV SOLN
1500.0000 mg | Freq: Two times a day (BID) | INTRAVENOUS | Status: DC
  Filled 2019-10-16: qty 300

## 2019-10-16 MED ORDER — ENOXAPARIN SODIUM 40 MG/0.4ML ~~LOC~~ SOLN
40.0000 mg | SUBCUTANEOUS | Status: DC
  Administered 2019-10-16 – 2019-10-17 (×2): 40 mg via SUBCUTANEOUS
  Filled 2019-10-16 (×2): qty 0.4

## 2019-10-16 MED ORDER — KETOROLAC TROMETHAMINE 30 MG/ML IJ SOLN
30.0000 mg | Freq: Four times a day (QID) | INTRAMUSCULAR | Status: DC | PRN
  Administered 2019-10-16 – 2019-10-17 (×2): 30 mg via INTRAVENOUS
  Filled 2019-10-16 (×2): qty 1

## 2019-10-16 MED ORDER — VANCOMYCIN HCL IN DEXTROSE 1-5 GM/200ML-% IV SOLN
1000.0000 mg | Freq: Once | INTRAVENOUS | Status: AC
  Administered 2019-10-16: 1000 mg via INTRAVENOUS
  Filled 2019-10-16: qty 200

## 2019-10-16 MED ORDER — ONDANSETRON HCL 4 MG PO TABS: 4.0000 mg | ORAL_TABLET | Freq: Four times a day (QID) | ORAL | Status: DC | PRN

## 2019-10-16 MED ORDER — ONDANSETRON HCL 4 MG/2ML IJ SOLN: 4.0000 mg | Freq: Four times a day (QID) | INTRAMUSCULAR | Status: DC | PRN

## 2019-10-16 MED ORDER — ACETAMINOPHEN 325 MG PO TABS: 650.0000 mg | ORAL_TABLET | Freq: Four times a day (QID) | ORAL | Status: DC | PRN

## 2019-10-16 MED ORDER — HYDROCODONE-ACETAMINOPHEN 5-325 MG PO TABS
1.0000 | ORAL_TABLET | ORAL | Status: DC | PRN
  Administered 2019-10-16: 2 via ORAL
  Filled 2019-10-16: qty 2

## 2019-10-16 MED ORDER — ACETAMINOPHEN 650 MG RE SUPP: 650.0000 mg | Freq: Four times a day (QID) | RECTAL | Status: DC | PRN

## 2019-10-16 MED ORDER — VANCOMYCIN HCL 1750 MG/350ML IV SOLN
1750.0000 mg | Freq: Two times a day (BID) | INTRAVENOUS | Status: DC
  Administered 2019-10-16: 1750 mg via INTRAVENOUS
  Filled 2019-10-16 (×5): qty 350

## 2019-10-16 SURGICAL SUPPLY — 29 items
APL PRP STRL LF DISP 70% ISPRP (MISCELLANEOUS) ×2
BNDG CMPR STD VLCR NS LF 5.8X3 (GAUZE/BANDAGES/DRESSINGS) ×2
BNDG ELASTIC 3X5.8 VLCR NS LF (GAUZE/BANDAGES/DRESSINGS) ×4 IMPLANT
CANISTER SUCT 1200ML W/VALVE (MISCELLANEOUS) ×4 IMPLANT
CAST PADDING 3X4FT ST 30246 (SOFTGOODS) ×1
CHLORAPREP W/TINT 26 (MISCELLANEOUS) ×4 IMPLANT
COVER WAND RF STERILE (DRAPES) ×4 IMPLANT
CUFF TOURN SGL QUICK 18X4 (TOURNIQUET CUFF) IMPLANT
CUFF TOURN SGL QUICK 24 (TOURNIQUET CUFF)
CUFF TRNQT CYL 24X4X16.5-23 (TOURNIQUET CUFF) IMPLANT
ELECT REM PT RETURN 9FT ADLT (ELECTROSURGICAL) ×3
ELECTRODE REM PT RTRN 9FT ADLT (ELECTROSURGICAL) ×3 IMPLANT
GAUZE SPONGE 4X4 12PLY STRL (GAUZE/BANDAGES/DRESSINGS) ×4 IMPLANT
GAUZE XEROFORM 1X8 LF (GAUZE/BANDAGES/DRESSINGS) ×4 IMPLANT
GLOVE SURG SYN 9.0  PF PI (GLOVE) ×1
GLOVE SURG SYN 9.0 PF PI (GLOVE) ×3 IMPLANT
GOWN SRG 2XL LVL 4 RGLN SLV (GOWNS) ×3 IMPLANT
GOWN STRL NON-REIN 2XL LVL4 (GOWNS) ×3
GOWN STRL REUS W/ TWL LRG LVL3 (GOWN DISPOSABLE) ×3 IMPLANT
GOWN STRL REUS W/TWL LRG LVL3 (GOWN DISPOSABLE) ×3
KIT TURNOVER KIT A (KITS) ×4 IMPLANT
NS IRRIG 500ML POUR BTL (IV SOLUTION) ×4 IMPLANT
PACK EXTREMITY (MISCELLANEOUS) ×4 IMPLANT
PAD CAST CTTN 3X4 STRL (SOFTGOODS) ×3 IMPLANT
PAD PREP 24X41 OB/GYN DISP (PERSONAL CARE ITEMS) ×4 IMPLANT
PADDING CAST COTTON 3X4 STRL (SOFTGOODS) ×2
SCALPEL PROTECTED #10 DISP (BLADE) ×4 IMPLANT
SCALPEL PROTECTED #15 DISP (BLADE) ×4 IMPLANT
SUT ETHILON 4 0 P 3 18 (SUTURE) ×4 IMPLANT
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
## 2019-10-16 NOTE — ED Notes (Signed)
Pt arrived to exam room resting on stretcher. Guardian at the bedside and states she will remain with pt for the night. Pt alert and calm with no distress noted. Provided for comfort and safety. Will continue to assess.

## 2019-10-16 NOTE — Progress Notes (Signed)
Pharmacy Antibiotic Note  Joshua Franco is a 29 y.o. male admitted on 10/15/2019 with cellulitis.  Pharmacy has been consulted for Vancomycin dosing.  Plan: Will adjust dose to Vancomycin 1750 mg IV Q 12 hrs.  Goal AUC 400-550. Expected AUC: 519, Css min 11.9 SCr used: 0.7  Height: 5\' 7"  (170.2 cm) Weight: 74.8 kg (165 lb) IBW/kg (Calculated) : 66.1  Temp (24hrs), Avg:98.6 F (37 C), Min:98.4 F (36.9 C), Max:98.7 F (37.1 C)  Recent Labs  Lab 10/15/19 1639  WBC 7.5  CREATININE 0.57*  LATICACIDVEN 0.8    Estimated Creatinine Clearance: 127.4 mL/min (A) (by C-G formula based on SCr of 0.57 mg/dL (L)).    Allergies  Allergen Reactions  . Amoxicillin Other (See Comments) and Swelling    Throat swelling  . Penicillins Other (See Comments) and Swelling    Racing heart     Antimicrobials this admission:  5/26 vancomycin  >>   Microbiology results:  BCx: Pending   Thank you for allowing pharmacy to be a part of this patient's care.  6/26, PharmD, BCPS Clinical Pharmacist 10/16/2019 2:07 PM
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
## 2019-10-16 NOTE — ED Notes (Signed)
Sitter Is at bedside at this time. Pt has no complaints and is resting in bed at this time.

## 2019-10-16 NOTE — Progress Notes (Signed)
Unable to bring patient to operating room today as there is no facility in the hospital to care for him postoperatively.  This is what I told the emergency room physician last night.

## 2019-10-16 NOTE — ED Notes (Addendum)
Guardian calls for this nurse to pt exam room and states she is ready to go home. Charge nurse notified and sitter called for to ensure pt safety. This nurse to stay in exam room until sitter arrives.

## 2019-10-16 NOTE — Consult Note (Signed)
Reason for Consult: Right lateral ankle fracture and wound self-inflicted Referring Physician: Dr. Tonette Lederer is an 29 y.o. male.  HPI:  HPI: Joshua Franco is a 29 y.o. male with medical history significant for bipolar mood disorder and PTSD who was brought into the emergency room after he was noted to be limping.  4 days prior to arrival patient was hitting his right ankle with a book trying to hurt himself because he wanted to come in to be evaluated in the hospital.  He has had no fever or chills.  Most of the history is taken from the ER provider.  His guardian gave the history and said that this is baseline behavior for him.   ED Course: Vitals in the ER were within normal limits.  WBC 7500, hemoglobin 11.4.  Otherwise no significant abnormalities x-ray of the right ankle showed a distal right fibula fracture.  Follow-up ultrasound to evaluate the swelling showed a 4.2 cm isoechoic avascular mass with findings suspicious for an abscess with multiple pockets of gas with other differential considerations including a large hematoma in the setting of trauma.  Patient was started on vancomycin This is obtained from records as patient is unreliable.  Concerns about him causing further self-harm in our hospital and recommended transfer to tertiary care but no longer taken.  Past Medical History:  Diagnosis Date  . Anxiety   . Anxiety disorder   . Bipolar 1 disorder ([place])   . Depression   . Diabetes mellitus without complication ([place])   . GERD (gastroesophageal reflux disease)   . HTN (hypertension)   . Hyperactive   . Hyperlipidemia   . MR (mental retardation)   . Obesity   . Obesity hypoventilation syndrome ([place]) 05/06/2013  . PTSD (post-traumatic stress disorder)   . Schizophrenia ([place])   . 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 HAND SURGERY Left    2008    Family History  Problem Relation Age of Onset  . Diabetes Father    . Diabetes Maternal Grandmother     Social History:  reports that he has been smoking cigarettes. He has a 5.00 pack-year smoking history. He has never used smokeless tobacco. He reports that he does not drink alcohol or use drugs.  Allergies:  Allergies  Allergen Reactions  . Amoxicillin Other (See Comments) and Swelling    Throat swelling  . Penicillins Other (See Comments) and Swelling    Racing heart     Medications: I have reviewed the patient's current medications.  Results for orders placed or performed during the hospital encounter of 10/15/19 (from the past 48 hour(s))  Comprehensive metabolic panel     Status: Abnormal   Collection Time: 10/15/19  4:39 PM  Result Value Ref Range   Sodium 138 135 - 145 mmol/L   Potassium 4.3 3.5 - 5.1 mmol/L   Chloride 99 98 - 111 mmol/L   CO2 28 22 - 32 mmol/L   Glucose, Bld 103 (H) 70 - 99 mg/dL    Comment: Glucose reference range applies only to samples taken after fasting for at least 8 hours.   BUN 16 6 - 20 mg/dL   Creatinine, Ser 0.57 (L) 0.61 - 1.24 mg/dL   Calcium 9.5 8.9 - 10.3 mg/dL   Total Protein 7.6 6.5 - 8.1 g/dL   Albumin 3.2 (L) 3.5 - 5.0 g/dL   AST 36 15 - 41 U/L   ALT 80 (H)  0 - 44 U/L   Alkaline Phosphatase 64 38 - 126 U/L   Total Bilirubin 0.5 0.3 - 1.2 mg/dL   GFR calc non Af Amer >60 >60 mL/min   GFR calc Af Amer >60 >60 mL/min   Anion gap 11 5 - 15    Comment: Performed at [facility], [address] [place], [place] [address]  Lactic acid, plasma     Status: None   Collection Time: 10/15/19  4:39 PM  Result Value Ref Range   Lactic Acid, Venous 0.8 0.5 - 1.9 mmol/L    Comment: Performed at [facility], [address] [place], [place] [address]  CBC with Differential     Status: Abnormal   Collection Time: 10/15/19  4:39 PM  Result Value Ref Range   WBC 7.5 4.0 - 10.5 K/uL   RBC 3.97 (L) 4.22 - 5.81 MIL/uL   Hemoglobin 11.4 (L) 13.0 - 17.0 g/dL   HCT 82.4 (L) 23.5 - 36.1 %    MCV 88.2 80.0 - 100.0 fL   MCH 28.7 26.0 - 34.0 pg   MCHC 32.6 30.0 - 36.0 g/dL   RDW 44.3 15.4 - 00.8 %   Platelets 515 (H) 150 - 400 K/uL   nRBC 0.0 0.0 - 0.2 %   Neutrophils Relative % 78 %   Neutro Abs 5.8 1.7 - 7.7 K/uL   Lymphocytes Relative 13 %   Lymphs Abs 1.0 0.7 - 4.0 K/uL   Monocytes Relative 7 %   Monocytes Absolute 0.5 0.1 - 1.0 K/uL   Eosinophils Relative 2 %   Eosinophils Absolute 0.1 0.0 - 0.5 K/uL   Basophils Relative 0 %   Basophils Absolute 0.0 0.0 - 0.1 K/uL   Immature Granulocytes 0 %   Abs Immature Granulocytes 0.02 0.00 - 0.07 K/uL    Comment: Performed at [facility], [address] [place], [place] [address]  SARS Coronavirus 2 by RT PCR (hospital order, performed in [facility] hospital lab) Nasopharyngeal Nasopharyngeal Swab     Status: None   Collection Time: 10/15/19  9:24 PM   Specimen: Nasopharyngeal Swab  Result Value Ref Range   SARS Coronavirus 2 NEGATIVE NEGATIVE    Comment: (NOTE) SARS-CoV-2 target nucleic acids are NOT DETECTED. The SARS-CoV-2 RNA is generally detectable in upper and lower respiratory specimens during the acute phase of infection. The lowest concentration of SARS-CoV-2 viral copies this assay can detect is 250 copies / mL. A negative result does not preclude SARS-CoV-2 infection and should not be used as the sole basis for treatment or other patient management decisions.  A negative result may occur with improper specimen collection / handling, submission of specimen other than nasopharyngeal swab, presence of viral mutation(s) within the areas targeted by this assay, and inadequate number of viral copies (<250 copies / mL). A negative result must be combined with clinical observations, patient history, and epidemiological information. Fact Sheet for Patients:   BoilerBrush.com.cy Fact Sheet for Healthcare Providers: https://pope.com/ This test is not yet  approved or cleared  by the [place] FDA and has been authorized for detection and/or diagnosis of SARS-CoV-2 by FDA under an Emergency Use Authorization (EUA).  This EUA will remain in effect (meaning this test can be used) for the duration of the COVID-19 declaration under Section 564(b)(1) of the Act, 21 U.S.C. section 360bbb-3(b)(1), unless the authorization is terminated or revoked sooner. Performed at [facility], [address], [place], [place] [address]  DG Ankle Complete Right  Result Date: 10/15/2019 CLINICAL DATA:  Right ankle injury. EXAM: RIGHT ANKLE - COMPLETE 3+ VIEW COMPARISON:  December 08, 2011. FINDINGS: Mildly displaced fracture is seen involving the distal fibula with overlying soft tissue swelling. Joint spaces are intact. IMPRESSION: Mildly displaced distal fibular fracture. Electronically Signed   By: Lupita Raider M.D.   On: 10/15/2019 15:45   [place] RT LOWER EXTREM LTD SOFT TISSUE NON VASCULAR  Result Date: 10/15/2019 CLINICAL DATA:  Erythema and edema overlying lateral malleolus. Concern for abscess. EXAM: ULTRASOUND RIGHT LOWER EXTREMITY LIMITED TECHNIQUE: Ultrasound examination of the lower extremity soft tissues was performed in the area of clinical concern. COMPARISON:  X-ray dated Oct 15 2019 FINDINGS: In the palpable area of concern, there is a 4 x 2 x 4.2 cm heterogeneous, relatively isoechoic mass overlying the lateral malleolus. This mass is superficial to the underlying bone and deep to the cutaneous surface. Multiple echogenic foci are noted with shadowing and questionable ring down artifact. There is some overlying skin thickening and adjacent edema. IMPRESSION: The patient's palpable area of concern corresponds to a 4.2 cm heterogeneous relatively isoechoic avascular mass. Given the presence of multiple echogenic foci with possible ring down artifact, findings are suspicious for an abscess with multiple pockets of gas in the appropriate clinical  setting. Other differential considerations include a large hematoma in the setting of trauma. Electronically Signed   By: Katherine Mantle M.D.   On: 10/15/2019 17:16    Review of Systems Blood pressure 107/64, pulse 68, temperature 98.7 F (37.1 C), temperature source Oral, resp. rate 16, height 5\' 7"  (1.702 m), weight 74.8 kg, SpO2 96 %. Physical Exam He has approximately 6 cm swollen area on the anterior lateral ankle with central scarring from his pleasant penetrating injury to the ankle.  There is no lymphangitis above the mid calf.  Is very tender in the area.  He is able to flex extend his toes and has intact sensation of the dorsum of the foot there is no current drainage. X-rays and ultrasound images reviewed. Assessment/Plan: Abscess adjacent to distal fibula fracture Plan is for incision and drainage irrigation debridement and application of wound VAC along with IV antibiotics.  Culture will be obtained at time of surgery.  10/16/2019, 6:58 AM

## 2019-10-16 NOTE — ED Notes (Signed)
Floor unable to take pt at this time.

## 2019-10-16 NOTE — H&P (Signed)
History and Physical    Joshua Franco HBZ:[id number] DOB: [birth year] DOA: 10/15/2019  PCP: Milus Banister, MD   Patient coming from: Home I have personally briefly reviewed patient's old medical records in [facility]  Chief Complaint: Right ankle pain, redness and swelling  HPI: Joshua Franco is a 29 y.o. male with medical history significant for bipolar mood disorder and PTSD who was brought into the emergency room after he was noted to be limping.  4 days prior to arrival patient was hitting his right ankle with a book trying to hurt himself because he wanted to come in to be evaluated in the hospital.  He has had no fever or chills.  Most of the history is taken from the ER provider.  His guardian gave the history and said that this is baseline behavior for him.   ED Course: Vitals in the ER were within normal limits.  WBC 7500, hemoglobin 11.4.  Otherwise no significant abnormalities x-ray of the right ankle showed a distal right fibula fracture.  Follow-up ultrasound to evaluate the swelling showed a 4.2 cm isoechoic avascular mass with findings suspicious for an abscess with multiple pockets of gas with other differential considerations including a large hematoma in the setting of trauma.  Patient was started on vancomycin.  Orthopedics was initially consulted, Dr. Rosita Kea who deferred to podiatry.  Review of Systems: As per HPI otherwise 10 point review of systems negative.    Past Medical History:  Diagnosis Date  . Anxiety   . Anxiety disorder   . Bipolar 1 disorder (H[facility])   . Depression   . Diabetes mellitus without complication (HCC)   . GERD (gastroesophageal reflux disease)   . HTN (hypertension)   . Hyperactive   . Hyperlipidemia   . MR (mental retardation)   . Obesity   . Obesity hypoventilation syndrome ([facility]) 05/06/2013  . PTSD (post-traumatic stress disorder)   . Schizophrenia ([facility])   . 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    Past Surgical History:  Procedure  Laterality Date  . HAND SURGERY Left    2008     reports that he has been smoking cigarettes. He has a 5.00 pack-year smoking history. He has never used smokeless tobacco. He reports that he does not drink alcohol or use drugs.  Allergies  Allergen Reactions  . Amoxicillin Other (See Comments) and Swelling    Throat swelling  . Penicillins Other (See Comments) and Swelling    Racing heart     Family History  Problem Relation Age of Onset  . Diabetes Father   . Diabetes Maternal Grandmother      Prior to Admission medications   Medication Sig Start Date End Date Taking? Authorizing Provider  ARIPiprazole (ABILIFY MAINTENA) 400 MG SUSR Inject 400 mg into the muscle every 28 (twenty-eight) days. 10/13/13   Thermon Leyland, NP  ARIPiprazole (ABILIFY) 10 MG tablet Take 1 tablet (10 mg total) by mouth 2 (two) times daily after a meal. 09/19/13   Thermon Leyland, NP  benztropine (COGENTIN) 1 MG tablet Take 1 tablet (1 mg total) by mouth 2 (two) times daily. 09/19/13   Thermon Leyland, NP  carvedilol (COREG) 6.25 MG tablet Take 1 tablet (6.25 mg total) by mouth 2 (two) times daily with a meal. 09/19/13   Thermon Leyland, NP  fenofibrate micronized (LOFIBRA) 134 MG capsule Take 1 capsule (134 mg total) by mouth daily before breakfast. 09/19/13   Thermon Leyland, NP  FLUoxetine (PROZAC) 20  MG capsule Take 1 capsule (20 mg total) by mouth daily. 09/19/13   Niel Hummer, NP  lithium carbonate (ESKALITH) 450 MG CR tablet Take 1 tablet (450 mg total) by mouth 2 (two) times daily after a meal. 09/19/13   Niel Hummer, NP  metFORMIN (GLUCOPHAGE) 500 MG tablet Take 2 tablets (1,000 mg total) by mouth 2 (two) times daily with a meal. 09/19/13   Niel Hummer, NP  pantoprazole (PROTONIX) 20 MG tablet Take 1 tablet (20 mg total) by mouth 2 (two) times daily. 09/19/13   Niel Hummer, NP  prazosin (MINIPRESS) 2 MG capsule Take 1 capsule (2 mg total) by mouth at bedtime. 09/19/13   Niel Hummer, NP  simvastatin (ZOCOR)  20 MG tablet Take 1 tablet (20 mg total) by mouth every evening. 09/19/13   Niel Hummer, NP  traZODone (DESYREL) 100 MG tablet Take 1 tablet (100 mg total) by mouth at bedtime as needed for sleep. 09/19/13   Niel Hummer, NP    Physical Exam: Vitals:   10/15/19 1456 10/15/19 1458 10/16/19 0106  BP:  113/82 111/77  Pulse:  93 72  Resp:  20 18  Temp:  98.7 F (37.1 C)   TempSrc:  Oral   SpO2:  100% 98%  Weight: 74.8 kg    Height: 5\' 7"  (1.702 m)       Vitals:   10/15/19 1456 10/15/19 1458 10/16/19 0106  BP:  113/82 111/77  Pulse:  93 72  Resp:  20 18  Temp:  98.7 F (37.1 C)   TempSrc:  Oral   SpO2:  100% 98%  Weight: 74.8 kg    Height: 5\' 7"  (1.702 m)      Constitutional: Alert and awake, oriented x3, not in any acute distress. Eyes: PERLA, EOMI, irises appear normal, anicteric sclera,  ENMT: external ears and nose appear normal, normal hearing             Lips appears normal, oropharynx mucosa, tongue, posterior pharynx appear normal  Neck: neck appears normal, no masses, normal ROM, no thyromegaly, no JVD  CVS: S1-S2 clear, no murmur rubs or gallops,  , no carotid bruits, pedal pulses palpable Respiratory:  clear to auscultation bilaterally, no wheezing, rales or rhonchi. Respiratory effort normal. No accessory muscle use.  Abdomen: soft nontender, nondistended, normal bowel sounds, no hepatosplenomegaly, no hernias Musculoskeletal: : no cyanosis, clubbing , swelling right ankle and foot with pain on range of motion.  Dome-shaped tender fluctuant erythematous swelling over lateral malleolus Neuro: Cranial nerves II-XII intact, sensation, reflexes normal, strength Psych:stable mood and affect,  Skin: Dome-shaped tender fluctuant erythematous swelling over lateral malleolus  Labs on Admission: I have personally reviewed following labs and imaging studies  CBC: Recent Labs  Lab 10/15/19 1639  WBC 7.5  NEUTROABS 5.8  HGB 11.4*  HCT 35.0*  MCV 88.2  PLT 515*    Basic Metabolic Panel: Recent Labs  Lab 10/15/19 1639  NA 138  K 4.3  CL 99  CO2 28  GLUCOSE 103*  BUN 16  CREATININE 0.57*  CALCIUM 9.5   GFR: Estimated Creatinine Clearance: 127.4 mL/min (A) (by C-G formula based on SCr of 0.57 mg/dL (L)). Liver Function Tests: Recent Labs  Lab 10/15/19 1639  AST 36  ALT 80*  ALKPHOS 64  BILITOT 0.5  PROT 7.6  ALBUMIN 3.2*   No results for input(s): LIPASE, AMYLASE in the last 168 hours. No results for input(s): AMMONIA in the  last 168 hours. Coagulation Profile: No results for input(s): INR, PROTIME in the last 168 hours. Cardiac Enzymes: No results for input(s): CKTOTAL, CKMB, CKMBINDEX, TROPONINI in the last 168 hours. BNP (last 3 results) No results for input(s): PROBNP in the last 8760 hours. HbA1C: No results for input(s): HGBA1C in the last 72 hours. CBG: No results for input(s): GLUCAP in the last 168 hours. Lipid Profile: No results for input(s): CHOL, HDL, LDLCALC, TRIG, CHOLHDL, LDLDIRECT in the last 72 hours. Thyroid Function Tests: No results for input(s): TSH, T4TOTAL, FREET4, T3FREE, THYROIDAB in the last 72 hours. Anemia Panel: No results for input(s): VITAMINB12, FOLATE, FERRITIN, TIBC, IRON, RETICCTPCT in the last 72 hours. Urine analysis:    Component Value Date/Time   COLORURINE YELLOW 02/12/2013 1432   APPEARANCEUR CLEAR 02/12/2013 1432   LABSPEC 1.029 02/12/2013 1432   PHURINE 7.0 02/12/2013 1432   GLUCOSEU NEGATIVE 02/12/2013 1432   HGBUR NEGATIVE 02/12/2013 1432   BILIRUBINUR NEGATIVE 02/12/2013 1432   KETONESUR NEGATIVE 02/12/2013 1432   PROTEINUR NEGATIVE 02/12/2013 1432   UROBILINOGEN 0.2 02/12/2013 1432   NITRITE NEGATIVE 02/12/2013 1432   LEUKOCYTESUR NEGATIVE 02/12/2013 1432    Radiological Exams on Admission: DG Ankle Complete Right  Result Date: 10/15/2019 CLINICAL DATA:  Right ankle injury. EXAM: RIGHT ANKLE - COMPLETE 3+ VIEW COMPARISON:  December 08, 2011. FINDINGS: Mildly displaced  fracture is seen involving the distal fibula with overlying soft tissue swelling. Joint spaces are intact. IMPRESSION: Mildly displaced distal fibular fracture. Electronically Signed   By: Lupita Raider M.D.   On: 10/15/2019 15:45   [place] RT LOWER EXTREM LTD SOFT TISSUE NON VASCULAR  Result Date: 10/15/2019 CLINICAL DATA:  Erythema and edema overlying lateral malleolus. Concern for abscess. EXAM: ULTRASOUND RIGHT LOWER EXTREMITY LIMITED TECHNIQUE: Ultrasound examination of the lower extremity soft tissues was performed in the area of clinical concern. COMPARISON:  X-ray dated Oct 15 2019 FINDINGS: In the palpable area of concern, there is a 4 x 2 x 4.2 cm heterogeneous, relatively isoechoic mass overlying the lateral malleolus. This mass is superficial to the underlying bone and deep to the cutaneous surface. Multiple echogenic foci are noted with shadowing and questionable ring down artifact. There is some overlying skin thickening and adjacent edema. IMPRESSION: The patient's palpable area of concern corresponds to a 4.2 cm heterogeneous relatively isoechoic avascular mass. Given the presence of multiple echogenic foci with possible ring down artifact, findings are suspicious for an abscess with multiple pockets of gas in the appropriate clinical setting. Other differential considerations include a large hematoma in the setting of trauma. Electronically Signed   By: Katherine Mantle M.D.   On: 10/15/2019 17:16    Assessment/Plan Principal Problem:   1. Closed right fibular fracture   2. Self-inflicted injury   3. Abnormal ultrasound right ankle Possible Cellulitis and abscess of right leg Suspect Traumatic hematoma of ankle, right, initial encounter  -Patient presents with swelling and redness right ankle a few days after hitting above repeatedly against his ankle.  Ultrasound findings concerning for possible traumatic hematoma abscess.  WBC normal, no fever or chills -Podiatry consult -IV  vancomycin for possible cellulitis and abscess based on ultrasound findings -Pain management    Bipolar 1 disorder, depressed (HCC) -Psych consult in the a.m.    DVT prophylaxis: Lovenox  Code Status: full code  Family Communication:  none  Disposition Plan: Back to previous home environment Consults called: Dr. Alberteen Spindle, podiatry  Status:obs    Andris Baumann  MD [facility]     10/16/2019, 1:20 AM

## 2019-10-16 NOTE — ED Notes (Signed)
Sitter is at bedside and pt is sleep in bed at this time.

## 2019-10-16 NOTE — Progress Notes (Signed)
Pharmacy Antibiotic Note  Joshua Franco is a 29 y.o. male admitted on 10/15/2019 with cellulitis.  Pharmacy has been consulted for Vancomycin dosing.  Plan: Vancomycin 1500 mg IV Q 12 hrs. Goal AUC 400-550. Expected AUC: 505.8, Css min 12 SCr used: 0.8  Height: 5\' 7"  (170.2 cm) Weight: 74.8 kg (165 lb) IBW/kg (Calculated) : 66.1  Temp (24hrs), Avg:98.7 F (37.1 C), Min:98.7 F (37.1 C), Max:98.7 F (37.1 C)  Recent Labs  Lab 10/15/19 1639  WBC 7.5  CREATININE 0.57*  LATICACIDVEN 0.8    Estimated Creatinine Clearance: 127.4 mL/min (A) (by C-G formula based on SCr of 0.57 mg/dL (L)).    Allergies  Allergen Reactions  . Amoxicillin Other (See Comments) and Swelling    Throat swelling  . Penicillins Other (See Comments) and Swelling    Racing heart     Antimicrobials this admission:   >>    >>   Dose adjustments this admission:   Microbiology results:  BCx:   UCx:    Sputum:    MRSA PCR:   Thank you for allowing pharmacy to be a part of this patient's care.  10/17/19 A 10/16/2019 2:47 AM

## 2019-10-16 NOTE — ED Notes (Signed)
Checked with RN Amy if pt could eat, after confirming pt was given a snack tray and drink.

## 2019-10-16 NOTE — ED Notes (Signed)
Report called to sabrina rn floor nurse.

## 2019-10-16 NOTE — Anesthesia Preprocedure Evaluation (Deleted)
Anesthesia Evaluation  Patient identified by MRN, date of birth, ID band Patient awake    Reviewed: Allergy & Precautions, H&P , NPO status , Patient's Chart, lab work & pertinent test results  Airway        Dental   Pulmonary Current Smoker,           Cardiovascular hypertension,      Neuro/Psych PSYCHIATRIC DISORDERS Anxiety Depression Bipolar Disorder Schizophrenia negative neurological ROS     GI/Hepatic Neg liver ROS, GERD  ,  Endo/Other  diabetes  Renal/GU      Musculoskeletal   Abdominal   Peds  Hematology negative hematology ROS (+)   Anesthesia Other Findings Past Medical History: No date: Anxiety No date: Anxiety disorder No date: Bipolar 1 disorder (HCC) No date: Depression No date: Diabetes mellitus without complication (HCC) No date: GERD (gastroesophageal reflux disease) No date: HTN (hypertension) No date: Hyperactive No date: Hyperlipidemia No date: MR (mental retardation) No date: Obesity 05/06/2013: Obesity hypoventilation syndrome (HCC) No date: PTSD (post-traumatic stress disorder) No date: Schizophrenia (HCC) 05/06/2013: Snoring disorder  Past Surgical History: No date: HAND SURGERY; Left     Comment:  2008  BMI    Body Mass Index: 25.84 kg/m      Reproductive/Obstetrics negative OB ROS                             Anesthesia Physical Anesthesia Plan  ASA: III  Anesthesia Plan: General LMA   Post-op Pain Management:    Induction:   PONV Risk Score and Plan: Dexamethasone, Ondansetron, Midazolam and Treatment may vary due to age or medical condition  Airway Management Planned:   Additional Equipment:   Intra-op Plan:   Post-operative Plan:   Informed Consent: I have reviewed the patients History and Physical, chart, labs and discussed the procedure including the risks, benefits and alternatives for the proposed anesthesia with the patient  or authorized representative who has indicated his/her understanding and acceptance.     Dental Advisory Given  Plan Discussed with: Anesthesiologist, CRNA and Surgeon  Anesthesia Plan Comments:         Anesthesia Quick Evaluation

## 2019-10-16 NOTE — ED Notes (Signed)
IVC/ Consult pending 

## 2019-10-16 NOTE — ED Notes (Signed)
Resumed care from Veleda Mun t rn.  Pt alert.  Sitter with pt.  Caregiver also in with pt.

## 2019-10-16 NOTE — Progress Notes (Signed)
No charge progress note.  Joshua Franco is a 29 y.o. male with medical history significant for bipolar mood disorder and PTSD who was brought into the emergency room after he was noted to be limping.  4 days prior to arrival patient was hitting his right ankle with a book trying to hurt himself because he wanted to come in to be evaluated in the hospital.  He has had no fever or chills.  Most of the history is taken from the ER provider.  His guardian gave the history and said that this is baseline behavior for him.    Imaging with distal right fibular fracture and a 4.2 cm isoechoic avascular mass with suspicion of abscess/hematoma with multiple pockets of gas.  OR was consulted and they are planning to take him to the OR.  Today when seen in ED, he was waiting for orthopedic surgery to take him to the OR.  Patient he has tried hurting himself in the past too.  He denies any suicidal ideation or plan.   A large mass on right lateral malleolus with some surrounding erythema.  Continue with current management.

## 2019-10-16 NOTE — ED Notes (Addendum)
Admitting MD at the bedside for pt evaluation.  

## 2019-10-17 DIAGNOSIS — S8261XA Displaced fracture of lateral malleolus of right fibula, initial encounter for closed fracture: Secondary | ICD-10-CM | POA: Diagnosis not present

## 2019-10-17 DIAGNOSIS — S82831A Other fracture of upper and lower end of right fibula, initial encounter for closed fracture: Secondary | ICD-10-CM | POA: Diagnosis not present

## 2019-10-17 LAB — SURGICAL PCR SCREEN
MRSA, PCR: POSITIVE — AB
Staphylococcus aureus: POSITIVE — AB

## 2019-10-17 LAB — HIV ANTIBODY (ROUTINE TESTING W REFLEX): HIV Screen 4th Generation wRfx: NONREACTIVE

## 2019-10-17 MED ORDER — MUPIROCIN 2 % EX OINT
1.0000 "application " | TOPICAL_OINTMENT | Freq: Two times a day (BID) | CUTANEOUS | Status: DC
  Administered 2019-10-17: 1 via NASAL
  Filled 2019-10-17: qty 22

## 2019-10-17 MED ORDER — NAPROXEN 500 MG PO TABS: 500.0000 mg | ORAL_TABLET | Freq: Two times a day (BID) | ORAL | 0 refills | Status: AC | PRN

## 2019-10-17 MED ORDER — SULFAMETHOXAZOLE-TRIMETHOPRIM 800-160 MG PO TABS: 1.0000 | ORAL_TABLET | Freq: Two times a day (BID) | ORAL | 0 refills | Status: AC

## 2019-10-17 MED ORDER — CHLORHEXIDINE GLUCONATE CLOTH 2 % EX PADS: 6.0000 | MEDICATED_PAD | Freq: Every day | CUTANEOUS | Status: DC

## 2019-10-17 NOTE — Consult Note (Addendum)
Joshua Anne Fu MD Psychiatry Consult  [name]  10/17/19 Brief Free Hand  Patient leaving today   CC ---I hit my leg   HPi   Joshua Franco is a 29 year old male with bipolar disorder mixed, generalized anxiety and PTSD ---he is on [place] Abilify Maintena 400 monthly followed by outpatient  And lithium dose pending from caretaker.   He has had a waxing and waning course  Of mood swings, ups and downs, lability, highs and lows, shouting spells, IED, sudden anger outbursts, sudden hitting of Self --previous cutter and scraper (Impulse control NOS)----mixed with major depression and generalized anxiety   Depressed mood, crying spells, hopeless helpless feelings, lack of energy, motivation and concentration, lack of sleep, anhedonia, decreased appetite at times, lack of enthusiasm, sleeping during day too much ----worthless hopeless feelings.   He has excessive worry, nervousness tension frustration/ panic dread fear and doom and gloom sudden frozen and numb feelings, ----somatic and autonomic symptoms ---  He has PTSD based in past trauma  With nightmares, recreation of events, frozen --feelings, / numbness, disassociation, hitting of self ---  Has was regularly followed by outpatient  Psych and goes to day treatment  Meds not started in hospital    Now discharging home --  Past history   Drug and ETOH ---none   Court and Legal issues none   Education--HS  Now in art   Family history --Dad with severe ETOH issues, violence / mom with depression and anxiety   Social --lives with caretaker he met in art school   Lives alone ---paints and draws but has sudden PTSD that causes him to hit Self at times   MS Exam   Alert cooperative oriented to person place date and time   Consciousness not clouded or fluctuant   Rapport normal eye contact okay  Affect and Mood --slightly anxious /depressed   Psychosis and Mania none now  Thought process and content --still  with PTSD content  SI and HI --self harm --now contracts for safety ---no active plans   Movements --no movement issues --now   [employer] of knowledge and Intelligence normal   Abstraction normal proverbs normal   Memory --remote and recent intact and immediate through general memory question s  Judgement --fair sometimes poor under stress  Reliability fair to improving   Insight fair to improving   A/p  Diagnosis     Bipolar Disorder mixed  Generalized anxiety  PTSD chronic  Impulsive control NOS  Parent child issues   At this time maintain same home meds but add Cymbalta for anxiety and depression   Await call from Care worker on Lithium dose and to get his psych outpatient appointment soon   He knows risks and benefits of meds and agrees to proceed        Home meds to be restarted in clude  Prozac 20 mg daily  Abilify maintena 400 monthly  Abilify tab 5 mg po qhs  Trazodone 163m po qam  Lithium 450 bid with meals   He can have these written upon discharge for reconciliation.

## 2019-10-17 NOTE — Discharge Summary (Signed)
Physician Discharge Summary  Ernst BreachShianne Tabron [id number] DOB: 05/09/1991 DOA: 10/15/2019  PCP: Milus Banisterosen, Robert Dean, MD  Admit date: 10/15/2019 Discharge date: 10/17/2019  Admitted From: Home Disposition:  Home  Recommendations for Outpatient Follow-up:  1. Follow up with PCP in 1-2 weeks 2. Follow up with orthopedic surgery. 3. Please obtain BMP/CBC in one week 4. Please follow up on the following pending results:None  Home Health: No Equipment/Devices:None Discharge Condition: Stable CODE STATUS: Full Diet recommendation: Heart Healthy   Brief/Interim Summary: Joshua Jacksonis a 29 y.o.malewith medical history significant forbipolar mood disorder and PTSD who was brought into the emergency room after he was noted to be limping. 4 days prior to arrival patient was hitting his right ankle with a book trying to hurt himself because he wanted to come in to be evaluated in the hospital. He has had no fever or chills. Most of the history is taken from the ER provider. His guardian gave the history and said that this is baseline behavior for him.  Imaging with distal right fibular fracture and a 4.2 cm isoechoic avascular mass with suspicion of abscess/hematoma with multiple pockets of gas.    PT was consulted and they were initially planning to take him to the OR for I&D.  Swelling and symptoms started improving with antibiotics and they decided to continue with conservative management. Crutches was ordered and he will be discharge on Bactrim for 7 days. He will follow-up with orthopedic surgery as an outpatient.  He will continue his psych home meds.   Discharge Diagnoses:  Principal Problem:   Closed right fibular fracture Active Problems:   Bipolar 1 disorder, depressed, severe (HCC)   Bipolar disorder with severe depression (HCC)   Bipolar depression (HCC)   Bipolar 1 disorder, depressed (HCC)   Self-inflicted injury   Suspect Traumatic hematoma of ankle, right, initial  encounter   Abnormal ultrasound right ankle   Possible Cellulitis and abscess of right leg   Cellulitis of right leg  Discharge Instructions  Discharge Instructions    Diet - low sodium heart healthy   Complete by: As directed    Discharge instructions   Complete by: As directed    It was pleasure taking care of you. Giving you antibiotics to be taken for 5 days, please take it as directed. I am also giving you a pain medicine called naproxen you can take it twice daily as needed for pain. Follow-up with orthopedic surgery.   Increase activity slowly   Complete by: As directed      Allergies as of 10/17/2019      Reactions   Amoxicillin Other (See Comments), Swelling   Throat swelling   Penicillins Other (See Comments), Swelling   Racing heart      Medication List    STOP taking these medications   carvedilol 6.25 MG tablet Commonly known as: COREG   fenofibrate micronized 134 MG capsule Commonly known as: LOFIBRA   metFORMIN 500 MG tablet Commonly known as: GLUCOPHAGE   pantoprazole 20 MG tablet Commonly known as: PROTONIX   prazosin 2 MG capsule Commonly known as: MINIPRESS   simvastatin 20 MG tablet Commonly known as: ZOCOR     TAKE these medications   ARIPiprazole ER 400 MG Susr Commonly known as: Abilify Maintena Inject 400 mg into the muscle every 28 (twenty-eight) days.   ARIPiprazole 5 MG tablet Commonly known as: ABILIFY Take 1 tablet by mouth in the morning.   benztropine 1 MG tablet Commonly known as:  COGENTIN Take 1 tablet (1 mg total) by mouth 2 (two) times daily.   FLUoxetine 20 MG capsule Commonly known as: PROZAC Take 1 capsule (20 mg total) by mouth daily.   lithium carbonate 450 MG CR tablet Commonly known as: ESKALITH Take 1 tablet (450 mg total) by mouth 2 (two) times daily after a meal.   naproxen 500 MG tablet Commonly known as: Naprosyn Take 1 tablet (500 mg total) by mouth 2 (two) times daily as needed for mild pain or  moderate pain. Take with meals.   sulfamethoxazole-trimethoprim 800-160 MG tablet Commonly known as: BACTRIM DS Take 1 tablet by mouth 2 (two) times daily for 5 days.   traZODone 100 MG tablet Commonly known as: DESYREL Take 1 tablet (100 mg total) by mouth at bedtime as needed for sleep. What changed:   how much to take  when to take this            Durable Medical Equipment  (From admission, onward)         Start     Ordered   10/17/19 1039  For home use only DME Crutches  Once     10/17/19 1038         Follow-up Information    Hessie Knows, MD. Schedule an appointment as soon as possible for a visit.   Specialty: Orthopedic Surgery Contact information: [place] [address] [phone]        Lara Mulch, MD. Schedule an appointment as soon as possible for a visit.   Specialty: Family Medicine Contact information: [address] [place] [address] [phone]          Allergies  Allergen Reactions  . Amoxicillin Other (See Comments) and Swelling    Throat swelling  . Penicillins Other (See Comments) and Swelling    Racing heart     Consultations:  Orthopedic surgery.  Procedures/Studies: DG Ankle Complete Right  Result Date: 10/15/2019 CLINICAL DATA:  Right ankle injury. EXAM: RIGHT ANKLE - COMPLETE 3+ VIEW COMPARISON:  December 08, 2011. FINDINGS: Mildly displaced fracture is seen involving the distal fibula with overlying soft tissue swelling. Joint spaces are intact. IMPRESSION: Mildly displaced distal fibular fracture. Electronically Signed   By: Marijo Conception M.D.   On: 10/15/2019 15:45   [place] RT LOWER EXTREM LTD SOFT TISSUE NON VASCULAR  Result Date: 10/15/2019 CLINICAL DATA:  Erythema and edema overlying lateral malleolus. Concern for abscess. EXAM: ULTRASOUND RIGHT LOWER EXTREMITY LIMITED TECHNIQUE: Ultrasound examination of the lower extremity soft tissues was  performed in the area of clinical concern. COMPARISON:  X-ray dated Oct 15 2019 FINDINGS: In the palpable area of concern, there is a 4 x 2 x 4.2 cm heterogeneous, relatively isoechoic mass overlying the lateral malleolus. This mass is superficial to the underlying bone and deep to the cutaneous surface. Multiple echogenic foci are noted with shadowing and questionable ring down artifact. There is some overlying skin thickening and adjacent edema. IMPRESSION: The patient's palpable area of concern corresponds to a 4.2 cm heterogeneous relatively isoechoic avascular mass. Given the presence of multiple echogenic foci with possible ring down artifact, findings are suspicious for an abscess with multiple pockets of gas in the appropriate clinical setting. Other differential considerations include a large hematoma in the setting of trauma. Electronically Signed   By: Constance Holster M.D.   On: 10/15/2019 17:16    Subjective: Patient was feeling better when seen today.  He was ready to go home.  He was asking some training to use crutches, we ask over PT to address that.  Discharge Exam: Vitals:   10/16/19 2323 10/17/19 0718  BP: 110/69 108/74  Pulse: 73 68  Resp: 18 15  Temp:  98.5 F (36.9 C)  SpO2: 100% 98%   Vitals:   10/16/19 2130 10/16/19 2200 10/16/19 2323 10/17/19 0718  BP: 124/79 110/69 110/69 108/74  Pulse: 73 75 73 68  Resp:   18 15  Temp:    98.5 F (36.9 C)  TempSrc:    Oral  SpO2: 100% 100% 100% 98%  Weight:      Height:        General: Pt is alert, awake, not in acute distress Cardiovascular: RRR, S1/S2 +, no rubs, no gallops Respiratory: CTA bilaterally, no wheezing, no rhonchi Abdominal: Soft, NT, ND, bowel sounds + Extremities: no edema, no cyanosis, right lower extremity with Ace wrap.   The results of significant diagnostics from this hospitalization (including imaging, microbiology, ancillary and laboratory) are listed below for reference.     Microbiology: Recent Results (from the past 240 hour(s))  Culture, blood (routine x 2)     Status: None (Preliminary result)   Collection Time: 10/15/19  4:39 PM   Specimen: BLOOD  Result Value Ref Range Status   Specimen Description BLOOD BLOOD RIGHT HAND  Final   Special Requests   Final    BOTTLES DRAWN AEROBIC AND ANAEROBIC Blood Culture adequate volume   Culture   Final    NO GROWTH 2 DAYS Performed at [facility], [address] [place], [place] [address]    Report Status PENDING  Incomplete  Culture, blood (routine x 2)     Status: None (Preliminary result)   Collection Time: 10/15/19  4:39 PM   Specimen: BLOOD  Result Value Ref Range Status   Specimen Description BLOOD RIGHT ANTECUBITAL  Final   Special Requests   Final    BOTTLES DRAWN AEROBIC AND ANAEROBIC Blood Culture adequate volume   Culture   Final    NO GROWTH 2 DAYS Performed at [facility], [address] [place], [place] [address]    Report Status PENDING  Incomplete  SARS Coronavirus 2 by RT PCR (hospital order, performed in [facility] hospital lab) Nasopharyngeal Nasopharyngeal Swab     Status: None   Collection Time: 10/15/19  9:24 PM   Specimen: Nasopharyngeal Swab  Result Value Ref Range Status   SARS Coronavirus 2 NEGATIVE NEGATIVE Final    Comment: (NOTE) SARS-CoV-2 target nucleic acids are NOT DETECTED. The SARS-CoV-2 RNA is generally detectable in upper and lower respiratory specimens during the acute phase of infection. The lowest concentration of SARS-CoV-2 viral copies this assay can detect is 250 copies / mL. A negative result does not preclude SARS-CoV-2 infection and should not be used as the sole basis for treatment or other patient management decisions.  A negative result may occur with improper specimen collection / handling, submission of specimen other than nasopharyngeal swab, presence of viral mutation(s) within the areas targeted by this assay, and  inadequate number of viral copies (<250 copies / mL). A negative result must be combined with clinical observations, patient history, and epidemiological information. Fact Sheet for Patients:   BoilerBrush.com.cy Fact Sheet for Healthcare Providers: https://pope.com/ This test is not yet approved or cleared  by the [place] FDA and has been authorized for detection and/or diagnosis of SARS-CoV-2 by FDA under  an Emergency Use Authorization (EUA).  This EUA will remain in effect (meaning this test can be used) for the duration of the COVID-19 declaration under Section 564(b)(1) of the Act, 21 U.S.C. section 360bbb-3(b)(1), unless the authorization is terminated or revoked sooner. Performed at [facility], [address] [place], [place] [address]   Surgical PCR screen     Status: Abnormal   Collection Time: 10/16/19 11:28 PM   Specimen: Nasal Mucosa; Nasal Swab  Result Value Ref Range Status   MRSA, PCR POSITIVE (A) NEGATIVE Final    Comment: RESULT CALLED TO, READ BACK BY AND VERIFIED WITH: Rodena Medin RN 0120 10/17/19 HNM    Staphylococcus aureus POSITIVE (A) NEGATIVE Final    Comment: (NOTE) The Xpert SA Assay (FDA approved for NASAL specimens in patients 74 years of age and older), is one component of a comprehensive surveillance program. It is not intended to diagnose infection nor to guide or monitor treatment. Performed at [facility], [address] [place], [place] [address]      Labs: BNP (last 3 results) No results for input(s): BNP in the last 8760 hours. Basic Metabolic Panel: Recent Labs  Lab 10/15/19 1639  NA 138  K 4.3  CL 99  CO2 28  GLUCOSE 103*  BUN 16  CREATININE 0.57*  CALCIUM 9.5   Liver Function Tests: Recent Labs  Lab 10/15/19 1639  AST 36  ALT 80*  ALKPHOS 64  BILITOT 0.5  PROT 7.6  ALBUMIN 3.2*   No results for input(s): LIPASE, AMYLASE in the last 168  hours. No results for input(s): AMMONIA in the last 168 hours. CBC: Recent Labs  Lab 10/15/19 1639  WBC 7.5  NEUTROABS 5.8  HGB 11.4*  HCT 35.0*  MCV 88.2  PLT 515*   Cardiac Enzymes: No results for input(s): CKTOTAL, CKMB, CKMBINDEX, TROPONINI in the last 168 hours. BNP: Invalid input(s): POCBNP CBG: No results for input(s): GLUCAP in the last 168 hours. D-Dimer No results for input(s): DDIMER in the last 72 hours. Hgb A1c No results for input(s): HGBA1C in the last 72 hours. Lipid Profile No results for input(s): CHOL, HDL, LDLCALC, TRIG, CHOLHDL, LDLDIRECT in the last 72 hours. Thyroid function studies No results for input(s): TSH, T4TOTAL, T3FREE, THYROIDAB in the last 72 hours.  Invalid input(s): FREET3 Anemia work up No results for input(s): VITAMINB12, FOLATE, FERRITIN, TIBC, IRON, RETICCTPCT in the last 72 hours. Urinalysis    Component Value Date/Time   COLORURINE YELLOW 02/12/2013 1432   APPEARANCEUR CLEAR 02/12/2013 1432   LABSPEC 1.029 02/12/2013 1432   PHURINE 7.0 02/12/2013 1432   GLUCOSEU NEGATIVE 02/12/2013 1432   HGBUR NEGATIVE 02/12/2013 1432   BILIRUBINUR NEGATIVE 02/12/2013 1432   KETONESUR NEGATIVE 02/12/2013 1432   PROTEINUR NEGATIVE 02/12/2013 1432   UROBILINOGEN 0.2 02/12/2013 1432   NITRITE NEGATIVE 02/12/2013 1432   LEUKOCYTESUR NEGATIVE 02/12/2013 1432   Sepsis Labs Invalid input(s): PROCALCITONIN,  WBC,  LACTICIDVEN Microbiology Recent Results (from the past 240 hour(s))  Culture, blood (routine x 2)     Status: None (Preliminary result)   Collection Time: 10/15/19  4:39 PM   Specimen: BLOOD  Result Value Ref Range Status   Specimen Description BLOOD BLOOD RIGHT HAND  Final   Special Requests   Final    BOTTLES DRAWN AEROBIC AND ANAEROBIC Blood Culture adequate volume   Culture   Final    NO GROWTH 2 DAYS Performed at [facility], [address] [place],  [place] [address]    Report Status PENDING  Incomplete   Culture, blood (routine x 2)     Status: None (Preliminary result)   Collection Time: 10/15/19  4:39 PM   Specimen: BLOOD  Result Value Ref Range Status   Specimen Description BLOOD RIGHT ANTECUBITAL  Final   Special Requests   Final    BOTTLES DRAWN AEROBIC AND ANAEROBIC Blood Culture adequate volume   Culture   Final    NO GROWTH 2 DAYS Performed at [facility], [address], [place], [place] [address]    Report Status PENDING  Incomplete  SARS Coronavirus 2 by RT PCR (hospital order, performed in [facility] hospital lab) Nasopharyngeal Nasopharyngeal Swab     Status: None   Collection Time: 10/15/19  9:24 PM   Specimen: Nasopharyngeal Swab  Result Value Ref Range Status   SARS Coronavirus 2 NEGATIVE NEGATIVE Final    Comment: (NOTE) SARS-CoV-2 target nucleic acids are NOT DETECTED. The SARS-CoV-2 RNA is generally detectable in upper and lower respiratory specimens during the acute phase of infection. The lowest concentration of SARS-CoV-2 viral copies this assay can detect is 250 copies / mL. A negative result does not preclude SARS-CoV-2 infection and should not be used as the sole basis for treatment or other patient management decisions.  A negative result may occur with improper specimen collection / handling, submission of specimen other than nasopharyngeal swab, presence of viral mutation(s) within the areas targeted by this assay, and inadequate number of viral copies (<250 copies / mL). A negative result must be combined with clinical observations, patient history, and epidemiological information. Fact Sheet for Patients:   BoilerBrush.com.cy Fact Sheet for Healthcare Providers: https://pope.com/ This test is not yet approved or cleared  by the [place] FDA and has been authorized for detection and/or diagnosis of SARS-CoV-2 by FDA under an Emergency Use Authorization (EUA).  This EUA will remain in  effect (meaning this test can be used) for the duration of the COVID-19 declaration under Section 564(b)(1) of the Act, 21 U.S.C. section 360bbb-3(b)(1), unless the authorization is terminated or revoked sooner. Performed at [facility], [address], [place], [place] [address]   Surgical PCR screen     Status: Abnormal   Collection Time: 10/16/19 11:28 PM   Specimen: Nasal Mucosa; Nasal Swab  Result Value Ref Range Status   MRSA, PCR POSITIVE (A) NEGATIVE Final    Comment: RESULT CALLED TO, READ BACK BY AND VERIFIED WITH: Rodena Medin RN 0120 10/17/19 HNM    Staphylococcus aureus POSITIVE (A) NEGATIVE Final    Comment: (NOTE) The Xpert SA Assay (FDA approved for NASAL specimens in patients 56 years of age and older), is one component of a comprehensive surveillance program. It is not intended to diagnose infection nor to guide or monitor treatment. Performed at [facility], [address] [place], [place] [address]     Time coordinating discharge: Over 30 minutes  SIGNED:  Arnetha Courser, MD  [facility] 10/17/2019, 11:19 AM  If 7PM-7AM, please contact night-coverage [email or web address]  This record has been created using Conservation officer, historic buildings. Errors have been sought and corrected,but may not always be located. Such creation errors do not reflect on the standard of care.

## 2019-10-17 NOTE — Progress Notes (Signed)
Erythema is much improved.  Swelling remains but suspect this is hematoma with resolving infection. Regarding his ankle fracture this is well he can be weightbearing as tolerated.  Ace wrap was applied and I will follow-up with him in 5 days for reexamination. I think he is okay to be discharged on oral antibiotics.

## 2019-10-17 NOTE — Evaluation (Signed)
Physical Therapy Evaluation Patient Details Name: Joshua Franco MRN: [id number] DOB: Sep 29, 1990 Today's Date: 10/17/2019   History of Present Illness  Joshua Franco is a 37yoM who comes to [facility] on 5/26 c Rt ankle pain, edema, erythema. Unclear etiology, but 4d PTA, pt was hitting self in ankle with a book (self harm behaviors). PMH: BPD, PTSD, I/DD, schizophrenia, GAD, GERD, HTN. Imaging shows Rt distal fibula fracture. [place] of Right ankle shows an area consistent with abscess. PTA pt lives in [place], 2nd floor bedroom.  Clinical Impression  Pt admitted with above diagnosis. Pt currently with functional limitations due to the deficits listed below (see "PT Problem List"). Upon entry, pt in bed, awake and agreeable to participate. Sitter at bedside. The pt is alert and oriented x4, pleasant, conversational, smiles a lot, makes eye contact, and is generally a good historian. Pt denies any pain at rest or in weightbearing since onset last week. Pt able to AMB without any noted antalgia, denies pain. Gait speed indicates low falls risk. Pt educated on stairs performance, but is able to perform 16 stairs and turns without any UE support, again denies pain. Pt educated on limiting sporting activity and community distance walking FU with orthopedist next week. Pt is ready for DC from a PT standpoint. Patient is independent with all mobility at this time, all education completed, and time is given to address all questions/concerns. No additional skilled PT services needed at this time, PT signing off. PT recommends daily mobility ad lib or with nursing staff as needed to prevent deconditioning.       Follow Up Recommendations No PT follow up    Equipment Recommendations  None recommended by PT;Other (comment)(Pt has AC at home from old injury, but reports not doing well with them in the past due to difficulty with balance.)    Recommendations for Other Services       Precautions / Restrictions  Precautions Precautions: Fall Restrictions Weight Bearing Restrictions: Yes RLE Weight Bearing: Weight bearing as tolerated      Mobility  Bed Mobility Overal bed mobility: Independent                Transfers Overall transfer level: Independent                  Ambulation/Gait Ambulation/Gait assistance: Independent Gait Distance (Feet): 200 Feet Assistive device: None Gait Pattern/deviations: WFL(Within Functional Limits) Gait velocity: 1.34m/s   General Gait Details: no antalgia or asymmetry.  Stairs Stairs: Yes Stairs assistance: Independent Stair Management: No rails Number of Stairs: 16 General stair comments: Cued to use rail, but no rail needed.  Wheelchair Mobility    Modified Rankin (Stroke Patients Only)       Balance Overall balance assessment: Independent                                           Pertinent Vitals/Pain Pain Assessment: No/denies pain    Home Living Family/patient expects to be discharged to:: (AFL) Living Arrangements: Group Home               Additional Comments: 3    Prior Function Level of Independence: Independent               Hand Dominance        Extremity/Trunk Assessment   Upper Extremity Assessment Upper Extremity Assessment: Overall WFL for tasks assessed  Lower Extremity Assessment Lower Extremity Assessment: Overall WFL for tasks assessed    Cervical / Trunk Assessment Cervical / Trunk Assessment: Normal  Communication      Cognition Arousal/Alertness: Awake/alert Behavior During Therapy: WFL for tasks assessed/performed Overall Cognitive Status: Within Functional Limits for tasks assessed                                        General Comments      Exercises     Assessment/Plan    PT Assessment Patent does not need any further PT services  PT Problem List Decreased strength;Decreased activity tolerance;Decreased mobility        PT Treatment Interventions DME instruction;Stair training;Gait training    PT Goals (Current goals can be found in the Care Plan section)  Acute Rehab PT Goals PT Goal Formulation: All assessment and education complete, DC therapy    Frequency     Barriers to discharge        Co-evaluation               AM-PAC PT "6 Clicks" Mobility  Outcome Measure Help needed turning from your back to your side while in a flat bed without using bedrails?: None Help needed moving from lying on your back to sitting on the side of a flat bed without using bedrails?: None Help needed moving to and from a bed to a chair (including a wheelchair)?: None Help needed standing up from a chair using your arms (e.g., wheelchair or bedside chair)?: None Help needed to walk in hospital room?: None Help needed climbing 3-5 steps with a railing? : None 6 Click Score: 24    End of Session Equipment Utilized During Treatment: Gait belt Activity Tolerance: Patient tolerated treatment well;No increased pain Patient left: in bed;with call bell/phone within reach;Other (comment);with nursing/sitter in room(Sitter has stepped out of room, Diplomatic Services operational officer aware.) Nurse Communication: Mobility status PT Visit Diagnosis: Other abnormalities of gait and mobility (R26.89)    Time: 1335-1350 PT Time Calculation (min) (ACUTE ONLY): 15 min   Charges:   PT Evaluation $PT Eval Low Complexity: 1 Low PT Treatments $Gait Training: 8-22 mins       2:10 PM, 10/17/19 Rosamaria Lints, PT, DPT Physical Therapist - [facility]  [phone] (ASCOM)    Gracy Ehly C 10/17/2019, 2:07 PM

## 2019-10-17 NOTE — Progress Notes (Signed)
This Clinical research associate attempt x 2 to call Joshua Franco, no VM picked up to leave a message, I called other number listed and left a VM

## 2019-10-17 NOTE — TOC Transition Note (Signed)
Transition of Care [facility]) - CM/SW Discharge Note   Patient Details  Name: Joshua Franco MRN: [id number] Date of Birth: 16-Jul-1990  [facility]) CM/SW Contact:  Su Hilt, RN Phone Number: 10/17/2019, 10:30 AM   Clinical Narrative:     Met with the patient to discuss DC plan an needs, He lives with his mother Joshua Franco and asked that I call her for transportation, I called and left a general voice mail for a return call and provided my contact information The patient will need Crutches and I notified Zack with [employer], they will be brought to the room before DC He asked if PT to see him and help him with going up and down stairs, I asked the Doctor to order a PT eval and treat The plan is for the patient to DC today  Final next level of care: Home/Self Care Barriers to Discharge: Barriers Resolved   Patient Goals and CMS Choice        Discharge Placement                       Discharge Plan and Services   Discharge Planning Services: CM Consult            DME Arranged: Crutches DME Agency: [employer] Date DME Agency Contacted: 10/17/19 Time DME Agency Contacted: [phone] Representative spoke with at DME Agency: [place] Arranged: NA          Social Determinants of Health (SDOH) Interventions     Readmission Risk Interventions No flowsheet data found.

## 2019-10-20 LAB — CULTURE, BLOOD (ROUTINE X 2)
Culture: NO GROWTH
Culture: NO GROWTH
Special Requests: ADEQUATE
Special Requests: ADEQUATE

## 2021-09-30 ENCOUNTER — Encounter: Payer: Self-pay | Admitting: Internal Medicine

## 2021-09-30 ENCOUNTER — Ambulatory Visit (INDEPENDENT_AMBULATORY_CARE_PROVIDER_SITE_OTHER): Payer: Medicare Other | Admitting: Internal Medicine

## 2021-09-30 VITALS — BP 120/68 | HR 103 | Temp 97.6°F | Resp 16 | Ht 68.25 in | Wt 139.1 lb

## 2021-09-30 DIAGNOSIS — Z79899 Other long term (current) drug therapy: Secondary | ICD-10-CM

## 2021-09-30 DIAGNOSIS — Z8639 Personal history of other endocrine, nutritional and metabolic disease: Secondary | ICD-10-CM

## 2021-09-30 DIAGNOSIS — Z8679 Personal history of other diseases of the circulatory system: Secondary | ICD-10-CM | POA: Insufficient documentation

## 2021-09-30 DIAGNOSIS — F314 Bipolar disorder, current episode depressed, severe, without psychotic features: Secondary | ICD-10-CM

## 2021-09-30 DIAGNOSIS — F209 Schizophrenia, unspecified: Secondary | ICD-10-CM

## 2021-09-30 DIAGNOSIS — Z23 Encounter for immunization: Secondary | ICD-10-CM

## 2021-09-30 NOTE — Assessment & Plan Note (Signed)
History of uncontrolled blood pressures when he was heavier, today blood pressure is normal. Continue to work on lifestyle management with diet and exercise and continue to monitor.  ?

## 2021-09-30 NOTE — Assessment & Plan Note (Signed)
On Lithium for Bipolar, will check level if Psychiatry did not.  ?

## 2021-09-30 NOTE — Assessment & Plan Note (Signed)
Uncertain last [id number], plan to obtain blood work just done by Psychiatry. Will recheck A1c at follow up if there is not a recent one. Lost about 150 lbs, family history of diabetes.  ?

## 2021-09-30 NOTE — Progress Notes (Signed)
? ?New Patient Office Visit ? ?Subjective   ? ?Patient ID: Joshua Franco, male    DOB: 02-19-1991  Age: 31 y.o. MRN: [id number] ? ?CC:  ?Chief Complaint  ?Patient presents with  ? Establish Care  ? ? ?HPI ?Joshua Franco presents to establish care. The patient is here with his caregiver Jody. Lives in an AFL setting. Chronic medical conditions include Bipolar 1 Disorder, Schizophrenia, MR, DM2, HTN, HLD, GERD. ? ?Bipolar 1/Schizophrenia:  ?-Following with Dr. Claudie Revering, Psychiatry in [place], [place]  ?-Currently on a regimen of Prozac 20, Abilify 400 mg IM monthly, Lithium 450 BID, Seroquel 100 mg TID, Trazodone 100 mg at bedtime and Cogentin 1 mg BID.  ? ?Diabetes, Type 2: ?-Last A1c unknown  ?-Medications: nothing currently, had been over 300 pounds at one point but has lost over 150 pounds with diet changes ?-Extensive family history of diabetes in both parents and several family members ? ?Hx of HTN, HLD: ?-From when he was heavier, blood pressure good here today. ?-Uncertain about last lipid panel, will obtain records ? ?Health Maintenance: ?-Blood work, obtaining records ?-Tdap due ? ? ?Outpatient Encounter Medications as of 09/30/2021  ?Medication Sig  ? ARIPiprazole (ABILIFY MAINTENA) 400 MG SUSR Inject 400 mg into the muscle every 28 (twenty-eight) days.  ? benztropine (COGENTIN) 1 MG tablet Take 1 tablet (1 mg total) by mouth 2 (two) times daily.  ? FLUoxetine (PROZAC) 20 MG capsule Take 1 capsule (20 mg total) by mouth daily.  ? lithium carbonate (ESKALITH) 450 MG CR tablet Take 1 tablet (450 mg total) by mouth 2 (two) times daily after a meal.  ? traZODone (DESYREL) 100 MG tablet Take 1 tablet (100 mg total) by mouth at bedtime as needed for sleep. (Patient taking differently: Take 150 mg by mouth at bedtime.)  ? QUEtiapine (SEROQUEL) 100 MG tablet Take 100 mg by mouth 3 (three) times daily.  ? QUEtiapine (SEROQUEL) 50 MG tablet Take 50-100 mg by mouth daily as needed.  ? [DISCONTINUED] ARIPiprazole (ABILIFY)  5 MG tablet Take 1 tablet by mouth in the morning.  ? ?No facility-administered encounter medications on file as of 09/30/2021.  ? ? ?Past Medical History:  ?Diagnosis Date  ? Anxiety   ? Anxiety disorder   ? Bipolar 1 disorder (HCC)   ? Depression   ? Diabetes mellitus without complication (HCC)   ? GERD (gastroesophageal reflux disease)   ? HTN (hypertension)   ? Hyperactive   ? Hyperlipidemia   ? MR (mental retardation)   ? Obesity   ? Obesity hypoventilation syndrome (HCC) 05/06/2013  ? PTSD (post-traumatic stress disorder)   ? Schizophrenia (HCC)   ? 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 ? ?Past Surgical History:  ?Procedure Laterality Date  ? HAND SURGERY Left   ? 2008  ? ? ?Family History  ?Problem Relation Age of Onset  ? Diabetes Mother   ? Diabetes Father   ? Diabetes Maternal Grandmother   ? ? ?Social History  ? ?Socioeconomic History  ? Marital status: Single  ?  Spouse name: Not on file  ? Number of children: 0  ? Years of education: hs  ? Highest education level: Not on file  ?Occupational History  ? Occupation: Unemployed  ?  Employer: UNEMPLOYED  ?Tobacco Use  ? Smoking status: Former  ?  Packs/day: 0.50  ?  Years: 10.00  ?  Pack years: 5.00  ?  Types: Cigarettes  ? Smokeless tobacco: Never  ?Vaping Use  ? Vaping Use:  Never used  ?Substance and Sexual Activity  ? Alcohol use: No  ?  Comment: Caffeine intake is heavy  ? 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 Sexual activity: Not Currently  ?  Birth control/protection: None  ?Other Topics Concern  ? Not on file  ?Social History Narrative  ? Not on file  ? ?Social Determinants of Health  ? ?Financial Resource Strain: Not on file  ?Food Insecurity: Not on file  ?Transportation Needs: Not on file  ?Physical Activity: Not on file  ?Stress: Not on file  ?Social Connections: Not on file  ?Intimate Partner Violence: Not on file  ? ? ?Review of Systems  ?Constitutional:  Negative for chills and fever.  ?Respiratory:  Negative for cough.   ?Cardiovascular:   Negative for chest pain.  ?Gastrointestinal:  Negative for abdominal pain, constipation, diarrhea, nausea and vomiting.  ?Genitourinary:  Negative for dysuria.  ? ?  ? ? ?Objective   ? ?BP 120/68   Pulse (!) 103   Temp 97.6 ?F (36.4 ?C)   Resp 16   Ht 5' 8.25" (1.734 m)   Wt 139 lb 1.6 oz (63.1 kg)   SpO2 100%   BMI 21.00 kg/m?  ? ?Physical Exam ?Constitutional:   ?   Appearance: Normal appearance.  ?HENT:  ?   Head: Normocephalic and atraumatic.  ?   Mouth/Throat:  ?   Mouth: Mucous membranes are moist.  ?   Pharynx: Oropharynx is clear.  ?Eyes:  ?   Conjunctiva/sclera: Conjunctivae normal.  ?Cardiovascular:  ?   Rate and Rhythm: Normal rate and regular rhythm.  ?Pulmonary:  ?   Effort: Pulmonary effort is normal.  ?   Breath sounds: Normal breath sounds.  ?Abdominal:  ?   General: There is no distension.  ?   Palpations: Abdomen is soft.  ?   Tenderness: There is no abdominal tenderness.  ?Musculoskeletal:  ?   Right lower leg: No edema.  ?   Left lower leg: No edema.  ?Skin: ?   General: Skin is warm and dry.  ?Neurological:  ?   General: No focal deficit present.  ?   Mental Status: He is alert. Mental status is at baseline.  ?Psychiatric:     ?   Mood and Affect: Mood normal.  ? ? ? ?  ? ?Assessment & Plan:  ? ?Problem List Items Addressed This Visit   ? ?  ? Other  ? Bipolar 1 disorder, depressed, severe (HCC) - Primary  ?  Following with Dr. Claudie Revering, Psychiatry, will obtain last note and labs from her office. Current medication regimen includes Prozac 20, Abilify 400 mg IM monthly, Lithium 450 BID, Seroquel 100 mg TID, Trazodone 100 mg at bedtime and Cogentin 1 mg BID.  ? ?  ?  ? Schizophrenia (HCC)  ?  Following Psychiatry, on medications listed above.  ? ?  ?  ? Lithium use  ?  On Lithium for Bipolar, will check level if Psychiatry did not.  ? ?  ?  ? History of high blood pressure  ?  History of uncontrolled blood pressures when he was heavier, today blood pressure is normal. Continue to work on  lifestyle management with diet and exercise and continue to monitor.  ? ?  ?  ? History of diabetes mellitus  ?  Uncertain last A1c, plan to obtain blood work just done by Psychiatry. Will recheck A1c at follow up if there is not  a recent one. Lost about 150 lbs, family history of diabetes.  ? ?  ?  ? ?Other Visit Diagnoses   ? ? Need for Tdap vaccination      ? Relevant Orders  ? Tdap vaccine greater than or equal to 7yo IM (Completed)  ? ?  ? ? ?Return in about 4 weeks (around 10/28/2021).  ? ?Margarita MailElisabeth Vern Prestia, DO ? ? ?

## 2021-09-30 NOTE — Assessment & Plan Note (Signed)
Following Psychiatry, on medications listed above.  ?

## 2021-09-30 NOTE — Patient Instructions (Addendum)
It was great seeing you today! ? ?Plan discussed at today's visit: ?-No changes to medications made today  ?-Will request records and blood work ?-Tetanus vaccine given today, can take Ibuprofen or Tylenol if needed ?-Prepare for fasting labs next time  ? ?Follow up in: 1 month  ? ?Take care and let us know if you have any questions or concerns prior to your next visit. ? ?Dr. Caralee Ates ? ?

## 2021-09-30 NOTE — Assessment & Plan Note (Signed)
Following with Dr. Claudie Revering, Psychiatry, will obtain last note and labs from her office. Current medication regimen includes Prozac 20, Abilify 400 mg IM monthly, Lithium 450 BID, Seroquel 100 mg TID, Trazodone 100 mg at bedtime and Cogentin 1 mg BID.  ?

## 2021-10-25 ENCOUNTER — Ambulatory Visit (INDEPENDENT_AMBULATORY_CARE_PROVIDER_SITE_OTHER): Payer: Medicare Other | Admitting: Internal Medicine

## 2021-10-25 ENCOUNTER — Encounter: Payer: Self-pay | Admitting: Internal Medicine

## 2021-10-25 VITALS — BP 116/62 | HR 88 | Temp 97.4°F | Resp 16 | Ht 68.25 in | Wt 139.4 lb

## 2021-10-25 DIAGNOSIS — Z13220 Encounter for screening for lipoid disorders: Secondary | ICD-10-CM | POA: Diagnosis not present

## 2021-10-25 DIAGNOSIS — Z8679 Personal history of other diseases of the circulatory system: Secondary | ICD-10-CM | POA: Diagnosis not present

## 2021-10-25 DIAGNOSIS — F314 Bipolar disorder, current episode depressed, severe, without psychotic features: Secondary | ICD-10-CM | POA: Diagnosis not present

## 2021-10-25 DIAGNOSIS — Z8639 Personal history of other endocrine, nutritional and metabolic disease: Secondary | ICD-10-CM

## 2021-10-25 DIAGNOSIS — Z1159 Encounter for screening for other viral diseases: Secondary | ICD-10-CM

## 2021-10-25 NOTE — Patient Instructions (Signed)
It was great seeing you today!  Plan discussed at today's visit: -Blood work ordered today, results will be uploaded to MyChart.   Follow up in: 1 year   Take care and let us know if you have any questions or concerns prior to your next visit.  Dr. Conal Shetley  

## 2021-10-25 NOTE — Progress Notes (Signed)
New Patient Office Visit  Subjective    Patient ID: Joshua Franco, male    DOB: 03-Oct-1990  Age: 31 y.o. MRN: [id number]  CC:  Chief Complaint  Patient presents with   Follow-up    HPI Joshua Franco presents to establish care. The patient is here with his caregiver Jody. Lives in an AFL setting. Chronic medical conditions include Bipolar 1 Disorder, Schizophrenia, MR, DM2, HTN, HLD, GERD.   Bipolar 1/Schizophrenia:  -Following with Dr. Claudie Revering, Psychiatry in [place], [place]  -Currently on a regimen of Prozac 20, Abilify 400 mg IM monthly, Lithium 450 BID, Seroquel 150 mg TID (increased last month), Trazodone 100 mg at bedtime and Cogentin 1 mg BID.  -Compliant with all medications and reports no side effects.   Diabetes, Type 2: -Last A1c unknown - -Medications: nothing currently, had been over 300 pounds at one point but has lost over 150 pounds with diet changes -Extensive family history of diabetes in both parents and several family members  Hx of HTN, HLD: -From when he was heavier, blood pressure has been good here.  -Uncertain about last lipid panel  Health Maintenance: -Blood work, due   Outpatient Encounter Medications as of 10/25/2021  Medication Sig   ARIPiprazole (ABILIFY MAINTENA) 400 MG SUSR Inject 400 mg into the muscle every 28 (twenty-eight) days.   benztropine (COGENTIN) 1 MG tablet Take 1 tablet (1 mg total) by mouth 2 (two) times daily.   FLUoxetine (PROZAC) 20 MG capsule Take 1 capsule (20 mg total) by mouth daily.   lithium carbonate (ESKALITH) 450 MG CR tablet Take 1 tablet (450 mg total) by mouth 2 (two) times daily after a meal.   QUEtiapine (SEROQUEL) 100 MG tablet Take 150 mg by mouth 3 (three) times daily.   QUEtiapine (SEROQUEL) 50 MG tablet Take 50-100 mg by mouth daily as needed.   traZODone (DESYREL) 100 MG tablet Take 1 tablet (100 mg total) by mouth at bedtime as needed for sleep. (Patient taking differently: Take 150 mg by mouth at bedtime.)    No facility-administered encounter medications on file as of 10/25/2021.    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    Past Surgical History:  Procedure Laterality Date   HAND SURGERY Left    2008    Family History  Problem Relation Age of Onset   Diabetes Mother    Diabetes Father    Diabetes Maternal Grandmother     Social History   Socioeconomic History   Marital status: Single    Spouse name: Not on file   Number of children: 0   Years of education: hs   Highest education level: Not on file  Occupational History   Occupation: Unemployed    Employer: UNEMPLOYED  Tobacco Use   Smoking status: Former    Packs/day: 0.50    Years: 10.00    Pack years: 5.00    Types: Cigarettes   Smokeless tobacco: Never  Vaping Use   Vaping Use: Never used  Substance and Sexual Activity  Alcohol use: No    Comment: Caffeine intake is heavy   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  Respiratory:  Negative for cough.   Cardiovascular:  Negative for chest pain.  Gastrointestinal:  Negative  for abdominal pain, constipation, diarrhea, nausea and vomiting.  Genitourinary:  Negative for dysuria.       Objective    BP 116/62   Pulse 88   Temp (!) 97.4 F (36.3 C)   Resp 16   Ht 5' 8.25" (1.734 m)   Wt 139 lb 6.4 oz (63.2 kg)   SpO2 100%   BMI 21.04 kg/m   Physical Exam Constitutional:      Appearance: Normal appearance.  HENT:     Head: Normocephalic and atraumatic.     Mouth/Throat:     Mouth: Mucous membranes are moist.     Pharynx: Oropharynx is clear.  Eyes:     Conjunctiva/sclera: Conjunctivae normal.  Cardiovascular:     Rate and Rhythm: Normal rate and regular rhythm.  Pulmonary:     Effort: Pulmonary effort is normal.     Breath sounds: Normal breath sounds.  Musculoskeletal:     Right lower leg: No edema.     Left lower leg: No edema.  Skin:    General: Skin is warm and dry.  Neurological:     General: No focal deficit present.     Mental Status: He is alert. Mental status is at baseline.  Psychiatric:        Mood and Affect: Mood normal.        Assessment & Plan:   1. Bipolar 1 disorder, depressed, severe (HCC)/History of high blood pressure: Unfortunately we did not receive his Psychiatry records but he is seeing them frequently as well as participating in counseling. His Seroquel dose was recently changed to 150 mg TID, which he is tolerating well. He is due for annual labs, CBC and CMP today. Blood pressure has been good here and at goal since he lost weight. Plan to follow up in 1 year.   - CBC w/Diff/Platelet - COMPLETE METABOLIC PANEL WITH GFR  2. History of diabetes mellitus: Both personal and family history of diabetes, will check A1c today.   - HgB A1c  3. Lipid screening: Cholesterol screening due.   - Lipid Profile  4. Need for hepatitis C screening test: Screening due.   - Hepatitis C Antibody   Return in about 1 year (around 10/26/2022).   Margarita Mail, DO

## 2021-10-26 LAB — CBC WITH DIFFERENTIAL/PLATELET
Absolute Monocytes: 267 cells/uL (ref 200–950)
Basophils Absolute: 30 cells/uL (ref 0–200)
Basophils Relative: 0.7 %
Eosinophils Absolute: 198 cells/uL (ref 15–500)
Eosinophils Relative: 4.6 %
HCT: 38.1 % — ABNORMAL LOW (ref 38.5–50.0)
Hemoglobin: 12.6 g/dL — ABNORMAL LOW (ref 13.2–17.1)
Lymphs Abs: 1329 cells/uL (ref 850–3900)
MCH: 30 pg (ref 27.0–33.0)
MCHC: 33.1 g/dL (ref 32.0–36.0)
MCV: 90.7 fL (ref 80.0–100.0)
MPV: 9 fL (ref 7.5–12.5)
Monocytes Relative: 6.2 %
Neutro Abs: 2477 cells/uL (ref 1500–7800)
Neutrophils Relative %: 57.6 %
Platelets: 258 10*3/uL (ref 140–400)
RBC: 4.2 10*6/uL (ref 4.20–5.80)
RDW: 12.9 % (ref 11.0–15.0)
Total Lymphocyte: 30.9 %
WBC: 4.3 10*3/uL (ref 3.8–10.8)

## 2021-10-26 LAB — COMPLETE METABOLIC PANEL WITH GFR
AG Ratio: 1.7 (calc) (ref 1.0–2.5)
ALT: 23 U/L (ref 9–46)
AST: 16 U/L (ref 10–40)
Albumin: 4.1 g/dL (ref 3.6–5.1)
Alkaline phosphatase (APISO): 58 U/L (ref 36–130)
BUN: 16 mg/dL (ref 7–25)
CO2: 28 mmol/L (ref 20–32)
Calcium: 9.7 mg/dL (ref 8.6–10.3)
Chloride: 105 mmol/L (ref 98–110)
Creat: 0.75 mg/dL (ref 0.60–1.26)
Globulin: 2.4 g/dL (calc) (ref 1.9–3.7)
Glucose, Bld: 94 mg/dL (ref 65–99)
Potassium: 5.2 mmol/L (ref 3.5–5.3)
Sodium: 138 mmol/L (ref 135–146)
Total Bilirubin: 0.3 mg/dL (ref 0.2–1.2)
Total Protein: 6.5 g/dL (ref 6.1–8.1)
eGFR: 123 mL/min/{1.73_m2} (ref >=60)

## 2021-10-26 LAB — LIPID PANEL
Cholesterol: 178 mg/dL (ref <200)
HDL: 71 mg/dL (ref >=40)
LDL Cholesterol (Calc): 91 mg/dL (calc)
Non-HDL Cholesterol (Calc): 107 mg/dL (calc) (ref <130)
Total CHOL/HDL Ratio: 2.5 (calc) (ref <5.0)
Triglycerides: 75 mg/dL (ref <150)

## 2021-10-26 LAB — HEMOGLOBIN A1C
Hgb A1c MFr Bld: 4.9 % of total Hgb (ref <5.7)
Mean Plasma Glucose: 94 mg/dL
eAG (mmol/L): 5.2 mmol/L

## 2021-10-26 LAB — HEPATITIS C ANTIBODY
Hepatitis C Ab: NONREACTIVE
SIGNAL TO CUT-OFF: 0.17 (ref <1.00)

## 2022-06-13 ENCOUNTER — Ambulatory Visit (INDEPENDENT_AMBULATORY_CARE_PROVIDER_SITE_OTHER): Payer: Medicare Other | Admitting: Orthopaedic Surgery

## 2022-06-13 ENCOUNTER — Ambulatory Visit (INDEPENDENT_AMBULATORY_CARE_PROVIDER_SITE_OTHER): Payer: Medicare Other

## 2022-06-13 ENCOUNTER — Ambulatory Visit
Admission: EM | Admit: 2022-06-13 | Discharge: 2022-06-13 | Disposition: A | Discharge status: Home / Self Care | Payer: Medicare Other | Attending: Nurse Practitioner | Admitting: Nurse Practitioner

## 2022-06-13 DIAGNOSIS — R2242 Localized swelling, mass and lump, left lower limb: Secondary | ICD-10-CM

## 2022-06-13 DIAGNOSIS — M25572 Pain in left ankle and joints of left foot: Secondary | ICD-10-CM

## 2022-06-13 NOTE — ED Triage Notes (Signed)
Pt c/o abscess/ swelling to left lateral ankle that began about a week ago. The patient states there is not pain to the area.

## 2022-06-13 NOTE — Progress Notes (Signed)
Office Visit Note   Patient: Joshua Franco           Date of Birth: [birth year]           MRN: [id number] Visit Date: 06/13/2022              Requested by: Teodora Medici, [address] [place],  [place] [id number] PCP: Teodora Medici, DO   Assessment & Plan: Visit Diagnoses:  1. 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  Swelling is due to recent injury.  We will immobilize and compress with Ace bandage and Cam boot for the next 2 to 3 weeks that he can wean as tolerated.  Patient lives in a group home and does not sound like functionally speaking the ankle is that much of a problem.  Follow-up as needed.  Follow-Up Instructions: No follow-ups on file.   Orders:  No orders of the defined types were placed in this encounter.  No orders of the defined types were placed in this encounter.     Procedures: No procedures performed   Clinical Data: No additional findings.   Subjective: Chief Complaint  Patient presents with   Left Ankle - Pain    HPI Joshua Franco is a 32 year old gentleman who lives in a group home here with his caretaker comes in for evaluation of left ankle swelling that was noticed about 2 weeks ago.  It is tender to touch.  He has a history of repeated ankle sprains.  He fell while playing basketball.  He is able to weight-bear. Review of Systems  Constitutional: Negative.   HENT: Negative.    Eyes: Negative.   Respiratory: Negative.    Cardiovascular: Negative.   Gastrointestinal: Negative.   Endocrine: Negative.   Genitourinary: Negative.   Skin: Negative.   Allergic/Immunologic: Negative.   Neurological: Negative.   Hematological: Negative.   Psychiatric/Behavioral: Negative.    All other systems reviewed and are negative.    Objective: Vital Signs: There were no vitals taken for this  visit.  Physical Exam Vitals and nursing note reviewed.  Constitutional:      Appearance: He is well-developed.  HENT:     Head: Normocephalic and atraumatic.  Eyes:     Pupils: Pupils are equal, round, and reactive to light.  Pulmonary:     Effort: Pulmonary effort is normal.  Abdominal:     Palpations: Abdomen is soft.  Musculoskeletal:        General: Normal range of motion.     Cervical back: Neck supple.  Skin:    General: Skin is warm.  Neurological:     Mental Status: He is alert and oriented to person, place, and time.  Psychiatric:        Behavior: Behavior normal.        Thought Content: Thought content normal.        Judgment: Judgment normal.     Ortho Exam Examination left ankle shows large anterolateral swelling that feels fluctuant.  There is no warmth or cellulitis or infection.  Positive anterior drawer sign. Specialty Comments:  No specialty comments available.  Imaging: DG Ankle Complete Left  Result Date: 06/13/2022 CLINICAL DATA:  Mass in the LATERAL ankle for 1 week. EXAM: LEFT ANKLE COMPLETE - 3+ VIEW COMPARISON:  01/13/2013 FINDINGS: There is no acute fracture or subluxation. Degenerative changes are  present particularly at the MEDIAL malleolus. A soft tissue mass extends along the LATERAL and anterior aspect of the ankle, consistent with history. Mass contains no calcifications or gas. IMPRESSION: 1. Soft tissue mass.  Consider MRI for further evaluation if needed. 2. No acute osseous abnormality. 3. Degenerative changes at the MEDIAL malleolus. Electronically Signed   By: Nolon Nations M.D.   On: 06/13/2022 11:17     PMFS History: Patient Active Problem List   Diagnosis Date Noted   Schizophrenia ([place]) 09/30/2021   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   History of high blood pressure 09/30/2021   History of diabetes mellitus 97/67/3419   Self-inflicted injury 37/90/2409   Posttraumatic stress disorder 09/10/2013   Schizoaffective disorder, unspecified  condition 09/10/2013   Bipolar 1 disorder, depressed ([place]) 09/09/2013   Bipolar depression ([place]) 09/08/2013   Suicidal ideation 09/07/2013   Bipolar 1 disorder, depressed, severe ([place]) 08/30/2013   Bipolar disorder with severe depression ([place]) 08/30/2013   Acute posttraumatic stress disorder 02/13/2013   Past Medical History:  Diagnosis Date   Anxiety    Anxiety disorder    Bipolar 1 disorder ([place])    Depression    Diabetes mellitus without complication (HCC)    GERD (gastroesophageal reflux disease)    HTN (hypertension)    Hyperactive    Hyperlipidemia    MR (mental retardation)    Obesity    Obesity hypoventilation syndrome ([place]) 05/06/2013   PTSD (post-traumatic stress disorder)    Schizophrenia ([place])    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    Family History  Problem Relation Age of Onset   Diabetes Mother    Diabetes Father    Diabetes Maternal Grandmother     Past Surgical History:  Procedure Laterality Date   HAND SURGERY Left    2008   Social History   Occupational History   Occupation: Unemployed    Employer: UNEMPLOYED  Tobacco Use   Smoking status: Former    Packs/day: 0.50    Years: 10.00    Total pack years: 5.00    Types: Cigarettes   Smokeless tobacco: Never  Vaping Use   Vaping Use: Never used  Substance and Sexual Activity   Alcohol use: No    Comment: Caffeine intake is heavy   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

## 2022-06-13 NOTE — Discharge Instructions (Addendum)
Please contact orthopedics today to make a follow-up appointment for further evaluation of your ankle mass.  Please go to the emergency room if any worsening symptoms occur prior to seeing orthopedics

## 2022-06-13 NOTE — ED Provider Notes (Signed)
[facility]    CSN: [id number] Arrival date & time: 06/13/22  1036      History   Chief Complaint Chief Complaint  Patient presents with   Abscess    HPI Joshua Franco is a 32 y.o. male presents for a mass on his left ankle.  He is accompanied by his Oceanographer.  Patient reports 1 week of a lateral left ankle mass that is not painful.  He denies any injury to the ankle and states it just showed up 1 day.  He is able to bear weight and walk without pain or limitation.  No fevers or chills.  Denies history of MRSA.  He did have a similar issue on his right ankle in 2021 secondary to a distal right fibular fracture.  Ultrasound showed a 4.2 cm avascular mass with suspicion of abscess/hematoma with multiple pockets of gas.  He was placed on antibiotics and did not require an I&D.  Did follow-up with orthopedics shortly after and did have aspiration of hematoma in the office.  Patient states his current symptoms are similar to that.  Denies numbness or tingling.  Not used any OTC medications.  No other concerns at this time.   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    Patient Active Problem List   Diagnosis Date Noted   Schizophrenia (HCC) 09/30/2021   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   History of high blood pressure 09/30/2021   History of diabetes mellitus 09/30/2021   Self-inflicted injury 10/16/2019   Posttraumatic stress disorder 09/10/2013   Schizoaffective disorder, unspecified condition 09/10/2013   Bipolar 1 disorder, depressed (HCC) 09/09/2013    Bipolar depression (HCC) 09/08/2013   Suicidal ideation 09/07/2013   Bipolar 1 disorder, depressed, severe (HCC) 08/30/2013   Bipolar disorder with severe depression (HCC) 08/30/2013   Acute posttraumatic stress disorder 02/13/2013    Past Surgical History:  Procedure Laterality Date   HAND SURGERY Left    2008       Home Medications    Prior to Admission medications   Medication Sig Start Date End Date Taking? Authorizing Provider  ARIPiprazole (ABILIFY MAINTENA) 400 MG SUSR Inject 400 mg into the muscle every 28 (twenty-eight) days. 10/13/13   Thermon Leyland, NP  benztropine (COGENTIN) 1 MG tablet Take 1 tablet (1 mg total) by mouth 2 (two) times daily. 09/19/13   Thermon Leyland, NP  FLUoxetine (PROZAC) 20 MG capsule Take 1 capsule (20 mg total) by mouth daily. 09/19/13   Thermon Leyland, NP  lithium carbonate (ESKALITH) 450 MG CR tablet Take 1 tablet (450 mg total) by mouth 2 (two) times daily after a meal. 09/19/13   Thermon Leyland, NP  QUEtiapine (SEROQUEL) 100 MG tablet Take 150 mg by mouth 3 (three) times daily. 09/10/21   [provider]  QUEtiapine (SEROQUEL) 50 MG tablet Take 50-100 mg by mouth daily as needed. 07/15/21   [provider]  traZODone (DESYREL) 100 MG tablet Take 1 tablet (100 mg total)  by mouth at bedtime as needed for sleep. Patient taking differently: Take 150 mg by mouth at bedtime. 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 --      Excl. in [place]? --    No data found.  Updated Vital Signs BP (!) 115/56 (BP Location: Left Arm)   Pulse 82   Temp 98 F (36.7 C) (Oral)   Resp 16   SpO2 97%   Visual Acuity Right Eye Distance:   Left Eye Distance:   Bilateral Distance:    Right Eye Near:   Left Eye Near:    Bilateral Near:     Physical Exam Vitals and nursing note reviewed.  Constitutional:      Appearance: Normal appearance.  HENT:     Head: Normocephalic and atraumatic.  Eyes:     Pupils: Pupils are equal, round, and reactive to light.  Cardiovascular:     Rate and Rhythm: Normal rate.  Pulmonary:     Effort: Pulmonary effort is normal.  Musculoskeletal:       Feet:  Skin:    General: Skin is warm and dry.  Neurological:     General: No focal deficit present.     Mental Status: He is alert and oriented to person, place, and time.  Psychiatric:        Mood and Affect: Mood normal.        Behavior: Behavior normal.      UC Treatments / Results  Labs (all labs ordered are listed, but only abnormal results are displayed) Labs Reviewed - No data to display  EKG   Radiology DG Ankle Complete Left  Result Date: 06/13/2022 CLINICAL DATA:  Mass in the LATERAL ankle for 1 week. EXAM: LEFT ANKLE COMPLETE - 3+ VIEW COMPARISON:  01/13/2013 FINDINGS: There is no acute fracture or subluxation. Degenerative changes are present particularly at the MEDIAL malleolus. A soft  tissue mass extends along the LATERAL  and anterior aspect of the ankle, consistent with history. Mass contains no calcifications or gas. IMPRESSION: 1. Soft tissue mass.  Consider MRI for further evaluation if needed. 2. No acute osseous abnormality. 3. Degenerative changes at the MEDIAL malleolus. Electronically Signed   By: Nolon Nations M.D.   On: 06/13/2022 11:17    Procedures Procedures (including critical care time)  Medications Ordered in UC Medications - No data to display  Initial Impression / Assessment and Plan / UC Course  I have reviewed the triage vital signs and the nursing notes.  Pertinent labs & imaging results that were available during my care of the patient were reviewed by me and considered in my medical decision making (see chart for details).     Reviewed exam and symptoms with patient and caregiver. X-ray shows soft tissue mass and recommends MRI.  No known trauma to the area so unclear etiology of mass.  Does have history of traumatic hematoma after fracture to the right ankle.  Discuss additional imaging including ultrasound or MRI.  Discussed ER evaluation today but patient declined.  Will refer to orthopedics for further evaluation of symptoms.  He was instructed to go to the ER ASAP for any worsening symptoms that occur prior to him seeing orthopedics and he verbalized understanding. Final Clinical Impressions(s) / UC Diagnoses   Final diagnoses:  Ankle mass, left     Discharge Instructions      Please contact orthopedics today to make a follow-up appointment for further evaluation of your ankle mass.  Please go to the emergency room if any worsening symptoms occur prior to seeing orthopedics     ED Prescriptions   None    PDMP not reviewed this encounter.   Melynda Ripple, NP 06/13/22 [phone]

## 2022-07-18 ENCOUNTER — Ambulatory Visit
Admission: EM | Admit: 2022-07-18 | Discharge: 2022-07-18 | Disposition: A | Discharge status: Home / Self Care | Payer: Medicare Other | Attending: Physician Assistant | Admitting: Physician Assistant

## 2022-07-18 DIAGNOSIS — M71372 Other bursal cyst, left ankle and foot: Secondary | ICD-10-CM | POA: Diagnosis not present

## 2022-07-18 MED ORDER — IBUPROFEN 600 MG PO TABS: 600.0000 mg | ORAL_TABLET | Freq: Three times a day (TID) | ORAL | 0 refills | Status: DC

## 2022-07-18 NOTE — ED Provider Notes (Signed)
[facility]    CSN: [id number] Arrival date & time: 07/18/22  1159      History   Chief Complaint Chief Complaint  Patient presents with   Abscess    HPI Joshua Franco is a 32 y.o. male.   32 year old male presents with left ankle mass.  Patient is accompanied by guardian.  Patient indicates he has had for the past month and the enlarging mass on the lateral malleolus area of the left ankle.  He indicates that it is sore, tender, and hurts to walk.  Patient was seen by: Orthopedics and x-rays were taken a month ago.  Patient indicates he cannot wear the boot anymore because of the irritation and pain from the mass area.  He does indicate that the area has grown since his last visit.  He he is not having fever, chills, numbness, or tingling of the area.  He indicates the area is not red but it has grown.  He indicates he had a similar area on the right ankle that was drained and removed and repaired several years ago.  Guardian indicates he does have follow-up appointment with: Orthopedics on July 26, 2022.   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    Patient Active Problem List   Diagnosis Date Noted   Schizophrenia ([place]) 09/30/2021   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   History of high blood pressure 09/30/2021   History of diabetes mellitus [id number]   Self-inflicted injury [id number]   Posttraumatic stress disorder 09/10/2013   Schizoaffective disorder, unspecified condition 09/10/2013   Bipolar 1 disorder, depressed ([place]) 09/09/2013   Bipolar depression  ([place]) 09/08/2013   Suicidal ideation 09/07/2013   Bipolar 1 disorder, depressed, severe ([place]) 08/30/2013   Bipolar disorder with severe depression ([place]) 08/30/2013   Acute posttraumatic stress disorder 02/13/2013    Past Surgical History:  Procedure Laterality Date   HAND SURGERY Left    2008       Home Medications    Prior to Admission medications   Medication Sig Start Date End Date Taking? Authorizing Provider  ibuprofen (ADVIL) 600 MG tablet Take 1 tablet (600 mg total) by mouth 3 (three) times daily. 07/18/22  Yes Nyoka Lint, PA-C  ARIPiprazole (ABILIFY MAINTENA) 400 MG SUSR Inject 400 mg into the muscle every 28 (twenty-eight) days. 10/13/13   Niel Hummer, NP  benztropine (COGENTIN) 1 MG tablet Take 1 tablet (1 mg total) by mouth 2 (two) times daily. 09/19/13   Niel Hummer, NP  FLUoxetine (PROZAC) 20 MG capsule Take 1 capsule (20 mg total) by mouth daily. 09/19/13   Niel Hummer, NP  lithium carbonate (ESKALITH) 450 MG CR tablet Take 1 tablet (450 mg total) by mouth 2 (two) times daily after a meal. 09/19/13   Niel Hummer, NP  QUEtiapine (SEROQUEL) 100 MG tablet Take 150 mg by mouth 3 (three) times daily. 09/10/21   [provider]  QUEtiapine (SEROQUEL) 50 MG tablet Take 50-100 mg by mouth daily as needed.  07/15/21   [provider]  traZODone (DESYREL) 100 MG tablet Take 1 tablet (100 mg total) by mouth at bedtime as needed for sleep. Patient taking differently: Take 150 mg by mouth at bedtime. 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     Physical Exam Triage Vital Signs ED Triage Vitals  Enc Vitals Group     BP 07/18/22 1303 114/74     Pulse Rate 07/18/22 1303 75     Resp 07/18/22 1303 17     Temp 07/18/22 1303 97.8 F (36.6 C)     Temp Source 07/18/22 1303 Oral     SpO2 07/18/22 1303 98 %     Weight --      Height --      Head Circumference --      Peak Flow --      Pain Score 07/18/22 1302 9     Pain Loc --      Pain Edu? --      Excl. in [place]? --    No data found.  Updated Vital Signs BP 114/74 (BP Location: Left Arm)   Pulse 75   Temp 97.8 F (36.6 C) (Oral)   Resp 17   SpO2 98%   Visual Acuity Right Eye Distance:   Left Eye Distance:   Bilateral Distance:    Right Eye Near:   Left Eye Near:    Bilateral Near:     Physical Exam Constitutional:      Appearance: Normal appearance.  Musculoskeletal:       Legs:     Comments: Left ankle: There is a 3.5 x 4 cm round, raised, fluid-filled cystic formation on the left lateral malleolus area.  There is no redness, streaking, or drainage from the area.  There is mild pain on palpation.  Full range of motion is normal left ankle, stability is intact.  Neurological:     Mental Status: He is alert.      UC Treatments / Results  Labs (all labs ordered are listed, but only abnormal results are displayed) Labs Reviewed - No data to display  EKG   Radiology No results found.  Procedures Procedures (including critical care time)  Medications Ordered in UC Medications - No data to display  Initial Impression / Assessment and Plan / UC Course  I have reviewed the triage vital signs and the nursing notes.  Pertinent labs & imaging results that were available during my care of the patient were reviewed  by me and considered in my medical decision making (see chart for details).    Plan: Diagnosis will be treated with the following: 1.  Left lateral ankle  cyst/mass: A.  Ibuprofen 600 mg every 8 hours with food to help decrease pain. B.  Patient advised to not wear the boot but just wear good supportive shoes and avoid pressure to the area. C.  Advised to attend appointment on July 26, 2022 with[facility] to have the area evaluated and treated. 2.  Advised to follow-up urgent care as needed. Final Clinical Impressions(s) / UC Diagnoses   Final diagnoses:  Other bursal cyst, left ankle and foot     Discharge Instructions      Advised take ibuprofen 600 mg every 8 hours with food to help decrease pain and discomfort.  Advised to not wear the boot and wear a comfortable shoe to not put pressure on the cyst area.  Advised to follow-up with orthopedics on the appointment date of July 26, 2022 to have the area evaluated and treated.  Advised follow-up PCP or return to urgent care as needed.    ED Prescriptions     Medication Sig Dispense Auth. Provider   ibuprofen (ADVIL) 600 MG tablet Take 1 tablet (600 mg total) by mouth 3 (three) times daily. 30 tablet Nyoka Lint, PA-C      PDMP not reviewed this encounter.   Nyoka Lint, PA-C 07/18/22 1322

## 2022-07-18 NOTE — Discharge Instructions (Addendum)
Advised take ibuprofen 600 mg every 8 hours with food to help decrease pain and discomfort.  Advised to not wear the boot and wear a comfortable shoe to not put pressure on the cyst area.  Advised to follow-up with orthopedics on the appointment date of July 26, 2022 to have the area evaluated and treated.  Advised follow-up PCP or return to urgent care as needed.

## 2022-07-18 NOTE — ED Triage Notes (Signed)
Pt presents with abscess on left ankle X 2 months that is very painful.

## 2022-07-26 ENCOUNTER — Encounter: Payer: Self-pay | Admitting: Orthopaedic Surgery

## 2022-07-26 ENCOUNTER — Ambulatory Visit (INDEPENDENT_AMBULATORY_CARE_PROVIDER_SITE_OTHER): Payer: Medicare Other | Admitting: Orthopaedic Surgery

## 2022-07-26 DIAGNOSIS — M25572 Pain in left ankle and joints of left foot: Secondary | ICD-10-CM

## 2022-07-26 MED ORDER — LIDOCAINE HCL 1 % IJ SOLN
1.0000 mL | INTRAMUSCULAR | Status: AC | PRN
  Administered 2022-07-26: 1 mL

## 2022-07-26 MED ORDER — BUPIVACAINE HCL 0.5 % IJ SOLN
1.0000 mL | INTRAMUSCULAR | Status: AC | PRN
  Administered 2022-07-26: 1 mL via INTRA_ARTICULAR

## 2022-07-26 NOTE — Progress Notes (Signed)
Office Visit Note   Patient: Joshua Franco           Date of Birth: April 08, 1991           MRN: [id number] Visit Date: 07/26/2022              Requested by: Teodora Medici, [address] [place],  [place] [id number] PCP: Teodora Medici, DO   Assessment & Plan: Visit Diagnoses:  1. Pain in left ankle and joints of left foot     Plan: I aspirated the left ankle and was able to obtain 40 cc of synovial fluid.  This is sent to the lab for testing.  Compression wrap was applied and I would like to immobilize in a cam boot for 2 weeks.  He should follow-up if the fluid re-accumulates.  Follow-Up Instructions: No follow-ups on file.   Orders:  Orders Placed This Encounter  Procedures  . Synovial Fluid Analysis, Complete   No orders of the defined types were placed in this encounter.     Procedures: Medium Joint Inj: L ankle on 07/26/2022 4:11 PM Indications: pain Details: 25 G needle Medications: 1 mL lidocaine 1 %; 1 mL bupivacaine 0.5 % Aspirate: 40 mL Outcome: tolerated well, no immediate complications Patient was prepped and draped in the usual sterile fashion.      Clinical Data: No additional findings.   Subjective: Chief Complaint  Patient presents with  . Left Ankle - Pain    HPI  Patient returns today with his caretaker for reevaluation of chronic left ankle swelling.  I saw him about 6 weeks ago for swollen left ankle.  Unfortunately the swelling has not improved.  Review of Systems  Constitutional: Negative.   HENT: Negative.    Eyes: Negative.   Respiratory: Negative.    Cardiovascular: Negative.   Gastrointestinal: Negative.   Endocrine: Negative.   Genitourinary: Negative.   Skin: Negative.   Allergic/Immunologic: Negative.   Neurological: Negative.   Hematological: Negative.   Psychiatric/Behavioral: Negative.    All other systems reviewed and are negative.    Objective: Vital Signs: There were no vitals taken  for this visit.  Physical Exam Vitals and nursing note reviewed.  Constitutional:      Appearance: He is well-developed.  Pulmonary:     Effort: Pulmonary effort is normal.  Abdominal:     Palpations: Abdomen is soft.  Skin:    General: Skin is warm.  Neurological:     Mental Status: He is alert and oriented to person, place, and time.  Psychiatric:        Behavior: Behavior normal.        Thought Content: Thought content normal.        Judgment: Judgment normal.    Ortho Exam  Examination left ankle shows a large fluctuant mass on the lateral aspect of the ankle that is stable compared to prior exam.  No evidence of infection.  He has no pain with ankle or subtalar range of motion.  Specialty Comments:  No specialty comments available.  Imaging: No results found.   PMFS History: Patient Active Problem List   Diagnosis Date Noted  . Schizophrenia ([place]) 09/30/2021  . 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  . History of high blood pressure 09/30/2021  . History of diabetes mellitus 09/30/2021  . Self-inflicted injury [id number]  . Posttraumatic stress disorder 09/10/2013  . Schizoaffective disorder, unspecified condition 09/10/2013  . Bipolar 1 disorder, depressed ([place]) 09/09/2013  . Bipolar depression ([place])  09/08/2013  . Suicidal ideation 09/07/2013  . Bipolar 1 disorder, depressed, severe ([place]) 08/30/2013  . Bipolar disorder with severe depression ([place]) 08/30/2013  . Acute posttraumatic stress disorder 02/13/2013   Past Medical History:  Diagnosis Date  . Anxiety   . Anxiety disorder   . Bipolar 1 disorder ([place])   . Depression   . Diabetes mellitus without complication ([place])   . GERD (gastroesophageal reflux disease)   . HTN (hypertension)   . Hyperactive   . Hyperlipidemia   . MR (mental retardation)   . Obesity   . Obesity hypoventilation syndrome ([place]) 05/06/2013  . PTSD (post-traumatic stress disorder)   . Schizophrenia ([place])   . 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     Family History  Problem Relation Age of Onset  . Diabetes Mother   . Diabetes Father   . Diabetes Maternal Grandmother     Past Surgical History:  Procedure Laterality Date  . HAND SURGERY Left    2008   Social History   Occupational History  . Occupation: Merchandiser, retail: UNEMPLOYED  Tobacco Use  . Smoking status: Former    Packs/day: 0.50    Years: 10.00    Total pack years: 5.00    Types: Cigarettes  . Smokeless tobacco: Never  Vaping Use  . Vaping Use: Never used  Substance and Sexual Activity  . Alcohol use: No    Comment: Caffeine intake is heavy  . 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 Sexual activity: Not Currently    Birth control/protection: None

## 2022-07-27 LAB — SYNOVIAL FLUID ANALYSIS, COMPLETE
Basophils, %: 0 %
Eosinophils-Synovial: 0 % (ref 0–2)
Lymphocytes-Synovial Fld: 23 % (ref 0–74)
Monocyte/Macrophage: 63 % (ref 0–69)
Neutrophil, Synovial: 14 % (ref 0–24)
Synoviocytes, %: 0 % (ref 0–15)
WBC, Synovial: 136 cells/uL (ref <150)

## 2023-01-16 ENCOUNTER — Encounter (HOSPITAL_COMMUNITY): Payer: Self-pay | Admitting: Emergency Medicine

## 2023-01-16 ENCOUNTER — Emergency Department (HOSPITAL_COMMUNITY)
Admission: EM | Admit: 2023-01-16 | Discharge: 2023-01-17 | Disposition: A | Discharge status: Home / Self Care | Payer: Medicare Other | Attending: Emergency Medicine | Admitting: Emergency Medicine

## 2023-01-16 ENCOUNTER — Other Ambulatory Visit: Payer: Self-pay

## 2023-01-16 DIAGNOSIS — F329 Major depressive disorder, single episode, unspecified: Secondary | ICD-10-CM | POA: Insufficient documentation

## 2023-01-16 DIAGNOSIS — R45850 Homicidal ideations: Secondary | ICD-10-CM | POA: Insufficient documentation

## 2023-01-16 DIAGNOSIS — T1491XA Suicide attempt, initial encounter: Secondary | ICD-10-CM

## 2023-01-16 DIAGNOSIS — I10 Essential (primary) hypertension: Secondary | ICD-10-CM | POA: Insufficient documentation

## 2023-01-16 DIAGNOSIS — Z79899 Other long term (current) drug therapy: Secondary | ICD-10-CM | POA: Diagnosis not present

## 2023-01-16 DIAGNOSIS — R454 Irritability and anger: Secondary | ICD-10-CM | POA: Insufficient documentation

## 2023-01-16 DIAGNOSIS — E119 Type 2 diabetes mellitus without complications: Secondary | ICD-10-CM | POA: Insufficient documentation

## 2023-01-16 DIAGNOSIS — R45851 Suicidal ideations: Secondary | ICD-10-CM | POA: Diagnosis not present

## 2023-01-16 DIAGNOSIS — R440 Auditory hallucinations: Secondary | ICD-10-CM | POA: Diagnosis not present

## 2023-01-16 LAB — COMPREHENSIVE METABOLIC PANEL
ALT: 23 U/L (ref 0–44)
AST: 38 U/L (ref 15–41)
Albumin: 4.4 g/dL (ref 3.5–5.0)
Alkaline Phosphatase: 71 U/L (ref 38–126)
Anion gap: 8 (ref 5–15)
BUN: 15 mg/dL (ref 6–20)
CO2: 22 mmol/L (ref 22–32)
Calcium: 8.9 mg/dL (ref 8.9–10.3)
Chloride: 103 mmol/L (ref 98–111)
Creatinine, Ser: 0.91 mg/dL (ref 0.61–1.24)
GFR, Estimated: >60 mL/min (ref >60)
Glucose, Bld: 119 mg/dL — ABNORMAL HIGH (ref 70–99)
Potassium: 4 mmol/L (ref 3.5–5.1)
Sodium: 133 mmol/L — ABNORMAL LOW (ref 135–145)
Total Bilirubin: 0.7 mg/dL (ref 0.3–1.2)
Total Protein: 7.3 g/dL (ref 6.5–8.1)

## 2023-01-16 LAB — CBC
HCT: 40.3 % (ref 39.0–52.0)
Hemoglobin: 13.1 g/dL (ref 13.0–17.0)
MCH: 30.1 pg (ref 26.0–34.0)
MCHC: 32.5 g/dL (ref 30.0–36.0)
MCV: 92.6 fL (ref 80.0–100.0)
Platelets: 296 10*3/uL (ref 150–400)
RBC: 4.35 MIL/uL (ref 4.22–5.81)
RDW: 13.5 % (ref 11.5–15.5)
WBC: 9.1 10*3/uL (ref 4.0–10.5)
nRBC: 0 % (ref 0.0–0.2)

## 2023-01-16 LAB — SALICYLATE LEVEL: Salicylate Lvl: <7 mg/dL — ABNORMAL LOW (ref 7.0–30.0)

## 2023-01-16 LAB — CBG MONITORING, ED: Glucose-Capillary: 117 mg/dL — ABNORMAL HIGH (ref 70–99)

## 2023-01-16 LAB — ETHANOL: Alcohol, Ethyl (B): <10 mg/dL (ref <10)

## 2023-01-16 LAB — ACETAMINOPHEN LEVEL: Acetaminophen (Tylenol), Serum: <10 ug/mL — ABNORMAL LOW (ref 10–30)

## 2023-01-16 MED ORDER — LITHIUM CARBONATE ER 450 MG PO TBCR: 450.0000 mg | EXTENDED_RELEASE_TABLET | Freq: Two times a day (BID) | ORAL | Status: DC

## 2023-01-16 MED ORDER — FLUOXETINE HCL 20 MG PO CAPS
20.0000 mg | ORAL_CAPSULE | Freq: Every day | ORAL | Status: DC
  Administered 2023-01-17: 20 mg via ORAL
  Filled 2023-01-16: qty 1

## 2023-01-16 MED ORDER — QUETIAPINE FUMARATE 100 MG PO TABS
200.0000 mg | ORAL_TABLET | Freq: Three times a day (TID) | ORAL | Status: DC
  Administered 2023-01-17 (×2): 200 mg via ORAL
  Filled 2023-01-16 (×2): qty 2

## 2023-01-16 MED ORDER — TRAZODONE HCL 100 MG PO TABS
100.0000 mg | ORAL_TABLET | Freq: Every evening | ORAL | Status: DC | PRN
  Administered 2023-01-17: 100 mg via ORAL
  Filled 2023-01-16: qty 1

## 2023-01-16 NOTE — ED Triage Notes (Signed)
BIBA Per EMS: pt coming from outside w/ [place] to bilateral hands & arms due to jumping a barbwire fence. Heat exhaustion from walking outside for 5 hours. Pt feels better after being in air conditioning  Pt is now suicidal. Pt reports taking 5 xanax, a few pain pills and heroin today as well.  VSS

## 2023-01-16 NOTE — ED Provider Notes (Signed)
[place] EMERGENCY DEPARTMENT AT [facility] Provider Note   CSN: [id number] Arrival date & time: 01/16/23  1939     History  Chief Complaint  Patient presents with   Suicidal    Joshua Franco is a 32 y.o. male with history of diabetes, bipolar disorder, PTSD, mental retardation, GERD, hyperlipidemia, hypertension, anxiety, depression, schizophrenia, who presents to the emergency department after a suicide attempt.  Patient states that he has been increasingly upset at his group home.  He has been feeling more suicidal in the past year since his mother passed away.  He did attempt suicide at that time.  He states today that he ran away from his group home and because he could not find a gun to shoot himself, he was cutting his arm with a knife.  He also took 5 tablets of Xanax as well as some pain pills.  Endorsed to triage that he took some heroin as well.  He tried to grab onto a barb wire fence to cut himself.  Also complained of heat exhaustion from walking outside for 5 hours.  Notes that he was found and they called EMS.  He is complaining of ongoing suicidal ideation, homicidal ideation as he wants to kill his group Land.  Believes that he is hearing voices including the voice of his mother and Prudy Feeler.  No visual hallucinations.  Not complaining of any symptoms besides the cuts on his hands and arms.  HPI     Home Medications Prior to Admission medications   Medication Sig Start Date End Date Taking? Authorizing Provider  ARIPiprazole (ABILIFY MAINTENA) 400 MG SUSR Inject 400 mg into the muscle every 28 (twenty-eight) days. 10/13/13   Thermon Leyland, NP  benztropine (COGENTIN) 1 MG tablet Take 1 tablet (1 mg total) by mouth 2 (two) times daily. 09/19/13   Thermon Leyland, NP  FLUoxetine (PROZAC) 20 MG capsule Take 1 capsule (20 mg total) by mouth daily. 09/19/13   Thermon Leyland, NP  ibuprofen (ADVIL) 600 MG tablet Take 1 tablet (600 mg total) by mouth 3 (three)  times daily. 07/18/22   Ellsworth Lennox, PA-C  lithium carbonate (ESKALITH) 450 MG CR tablet Take 1 tablet (450 mg total) by mouth 2 (two) times daily after a meal. 09/19/13   Thermon Leyland, NP  QUEtiapine (SEROQUEL) 100 MG tablet Take 150 mg by mouth 3 (three) times daily. 09/10/21   [provider]  QUEtiapine (SEROQUEL) 50 MG tablet Take 50-100 mg by mouth daily as needed. 07/15/21   [provider]  traZODone (DESYREL) 100 MG tablet Take 1 tablet (100 mg total) by mouth at bedtime as needed for sleep. Patient taking differently: Take 150 mg by mouth at bedtime. 09/19/13   Thermon Leyland, NP      Allergies    Amoxicillin, Lisinopril, Penicillin g, and Penicillins    Review of Systems   Review of Systems  Skin:  Positive for wound.  Psychiatric/Behavioral:  Positive for self-injury and suicidal ideas.   All other systems reviewed and are negative.   Physical Exam Updated Vital Signs BP (!) 121/91   Pulse 82   Temp 99.7 F (37.6 C) (Oral)   Resp 18   Ht 5' 8.25" (1.734 m)   Wt 73.4 kg   SpO2 94%   BMI 24.42 kg/m  Physical Exam Vitals and nursing note reviewed.  Constitutional:      Appearance: Normal appearance.  HENT:     Head:  Normocephalic and atraumatic.  Eyes:     Conjunctiva/sclera: Conjunctivae normal.  Pulmonary:     Effort: Pulmonary effort is normal. No respiratory distress.  Skin:    General: Skin is warm and dry.     Comments: Superficial abrasions and lacerations noted to the ventral right forearm, and bilateral palms.  Bleeding is controlled.  Neurological:     Mental Status: He is alert.  Psychiatric:        Attention and Perception: He perceives auditory hallucinations. He does not perceive visual hallucinations.        Mood and Affect: Mood normal. Affect is angry.        Speech: Speech normal.        Behavior: Behavior is hyperactive. Behavior is cooperative.        Thought Content: Thought content is paranoid. Thought content includes  homicidal and suicidal ideation. Thought content includes homicidal and suicidal plan.     ED Results / Procedures / Treatments   Labs (all labs ordered are listed, but only abnormal results are displayed) Labs Reviewed  COMPREHENSIVE METABOLIC PANEL - Abnormal; Notable for the following components:      Result Value   Sodium 133 (*)    Glucose, Bld 119 (*)    All other components within normal limits  SALICYLATE LEVEL - Abnormal; Notable for the following components:   Salicylate Lvl <7.0 (*)    All other components within normal limits  ACETAMINOPHEN LEVEL - Abnormal; Notable for the following components:   Acetaminophen (Tylenol), Serum <10 (*)    All other components within normal limits  CBG MONITORING, ED - Abnormal; Notable for the following components:   Glucose-Capillary 117 (*)    All other components within normal limits  ETHANOL  CBC  RAPID URINE DRUG SCREEN, HOSP PERFORMED    EKG None  Radiology No results found.  Procedures Procedures    Medications Ordered in ED Medications - No data to display  ED Course/ Medical Decision Making/ A&P                                 Medical Decision Making Amount and/or Complexity of Data Reviewed Labs: ordered.   Patient is a 32 y.o. male  who presents to the emergency department for psychiatric complaint. Endorses suicide attempt to cut his arm and overdose on xanax and pain pills.  He is here voluntarily.  Past Medical History: diabetes, bipolar disorder, PTSD, mental retardation, GERD, hyperlipidemia, hypertension, anxiety, depression, schizophrenia,  Physical Exam: Vital signs, no acute distress.  Sitting in exam bed comfortably.  Superficial lacerations and abrasions noted to the ventral right forearm and bilateral palms.  No active bleeding.  Labs: Medical clearance labs ordered, with following pertinent results: CBC and CMP unremarkable.  Negative salicylate and acetaminophen levels.  Negative ethanol.   UDS pending.  Cardiac monitoring: EKG obtained and interpreted by attending physician which shows: This rhythm, diffuse nonspecific ST elevation  Disposition: Patient is otherwise medically cleared at this time pending medical clearance laboratory evaluation. Will consult TTS and appreciate their recommendations.  Patient is currently here voluntarily.  However he does demonstrate a threat to himself and others, and if he attempts to leave before behavioral health evaluation, I feel he will meet criteria for involuntary commitment.  There is some component that the patient is also here for secondary gain as he is unhappy at his group home.  States that  if he is sent back there he will kill the group home manager and then kill himself.  Final Clinical Impression(s) / ED Diagnoses Final diagnoses:  Suicide attempt [facility])  Homicidal ideation    Rx / DC Orders ED Discharge Orders     None      Portions of this report may have been transcribed using voice recognition software. Every effort was made to ensure accuracy; however, inadvertent computerized transcription errors may be present.    Jeanella Flattery 01/16/23 [id number]    Tegeler, Canary Brim, MD 01/16/23 [phone]
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
## 2023-01-17 DIAGNOSIS — F329 Major depressive disorder, single episode, unspecified: Secondary | ICD-10-CM | POA: Diagnosis not present

## 2023-01-17 LAB — RAPID URINE DRUG SCREEN, HOSP PERFORMED
Amphetamines: NOT DETECTED
Barbiturates: NOT DETECTED
Benzodiazepines: NOT DETECTED
Cocaine: NOT DETECTED
Opiates: NOT DETECTED
Tetrahydrocannabinol: NOT DETECTED

## 2023-01-17 LAB — LITHIUM LEVEL: Lithium Lvl: 0.74 mmol/L (ref 0.60–1.20)

## 2023-01-17 NOTE — Consult Note (Cosign Needed Addendum)
[facility] ED ASSESSMENT   Reason for Consult: Psych consult Referring Physician:  Lorin Roemhildt Patient Identification: Joshua Franco MRN:  [id number] ED Chief Complaint: Suicidal ideation  Diagnosis:  Principal Problem:   Suicidal ideation   ED Assessment Time Calculation: Start Time: 0900 Stop Time: 1000 Total Time in Minutes (Assessment Completion): 60  HPI:  Joshua Franco is a 32 y.o. male patient brought in by EMS with superficial lacerations to bilateral hands and arms.  Per chart review, patient has a history of diabetes, bipolar disorder, PTSD, mental retardation, GERD, hyperlipidemia, hypertension, anxiety, depression and schizophrenia.   Subjective:   Joshua Franco is a 32 y.o. male patient brought in by EMS with superficial lacerations to bilateral hands and arms.  Per chart review, patient has a history of diabetes, bipolar disorder, PTSD, mental retardation, GERD, hyperlipidemia, hypertension, anxiety, depression and schizophrenia.   Joshua Franco, 32 y.o., male patient seen face to face by this provider, consulted with Dr. Lucianne Muss; and chart reviewed on 01/17/23.  On evaluation Joshua Franco reports  that he took xanax, pain pills and heroin in a suicide attempt.  Patient's UDS on admission to the ED is negative for all substances.  Patient says he lives at a group home and that the staff "treat me like crap sometimes."  Patient says that his mother died a year ago on his birthday and it is hard to deal with.  Patient denies having had therapy for grief.  During evaluation Joshua Franco is in a hospital bed in no acute distress.  He is alert, oriented x 3, calm, cooperative and attentive.  His mood is euthymic with congruent affect.  He has normal speech and behavior.  Objectively there is no evidence of psychosis/mania or delusional thinking.  Patient is able to converse coherently, goal directed thoughts, no distractibility, or pre-occupation.  He does endorse   suicidal/self-harm/homicidal ideation and psychosis.  Patient answered question appropriately.    Per collateral from group home owner, Joshua Franco, Patient has a repeated pattern of claiming he is suicidal and coming to the hospital.  Patient "likes to come to the hospital.  He likes the attention, snacks  and to be taken care of."  Patient has no drug use as far as group home owner knows.  He says the patient has been on good behavior for the last few months.  He has a history of acting out more in his previous group home.  Mr Clabe Seal says patient does have a history of self harm and will scratch up his arms.  Prior to discharge, Mr Clabe Seal verbalized that they understood warning signs, triggers, and symptoms of worsening mental health and how to access emergency mental health care if they felt it was needed. He was instructed to call 911 or return to the emergency room if they experienced any concerning symptoms after discharge. Mr Clabe Seal voiced understanding and agreed to the above.  Every intervention has risks and benefits, including hospitalization. When there are no symptoms or risk factors modifiable by an inpatient admission, the potential negative effects of hospitalization (e.g. reinforcing dependency, removing autonomy, behavior contagion, causing trauma by creating a sense of being trapped) outweigh the benefits. While continual observation may reduce risk of harm in the moment, it does not decrease the likelihood of harm in the long term. Acute stabilization is essential to patient safety, but must be carried out in a way will not violate autonomy, justice, and non-maleficence. At the time of discharged it was determined that this  patient had maximized the possible benefits of psychiatric hospitalization.  Patient is discharging back to a controlled and monitored environment, his group home.  Patient is psychiatrically cleared.    When patient was told he was being discharged, he got upset.   He then admitted he was worried the group home owner would be mad at him because he got lost in the woods.  Once he was assured the group home owner was not angry and was happy to come pick him up, the patient relaxed.      Past Psychiatric History: Previously psychiatric hospitalizations.  HX of bipolar disorder, PTSD, mental retardation, anxiety, depression and schizophrenia.   Risk to Self or Others: Is the patient at risk to self? Yes Has the patient been a risk to self in the past 6 months? Yes Has the patient been a risk to self within the distant past? No Is the patient a risk to others? No Has the patient been a risk to others in the past 6 months? No Has the patient been a risk to others within the distant past? No  [place] Scale:  Flowsheet Row ED from 01/16/2023 in [facility] Emergency Department at [facility] ED from 07/18/2022 in [facility] Care at [facility] ED from 06/13/2022 in [facility] Urgent Care at [employer] [facility])  C-SSRS RISK CATEGORY High Risk No Risk No Risk       ASAM: ASAM Multidimensional Assessment Summary Dimension 1:  Description of individual's past and current experiences of substance use and withdrawal: n/a Dimension 2:  Description of patient's biomedical conditions and  complications: n/a Dimension 3:  Description of emotional, behavioral, or cognitive conditions and complications: n/a Dimension 4:  Description of Readiness to Change criteria: n/a Dimension 5:  Relapse, continued use, or continued problem potential critiera description: n/a Dimension 6:  Recovery/Iiving environment criteria description: n/a ASAM Recommended Level of Treatment:  (n/a)  Substance Abuse:  Alcohol / Drug Use Pain Medications: Please see MAR Prescriptions: Please see MAR Over the Counter: Please see MAR History of alcohol / drug use?: No history of alcohol / drug abuse Longest period of sobriety (when/how long):  n/a Withdrawal Symptoms:  (n/a)  Past Medical History:  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    Past Surgical History:  Procedure Laterality Date   HAND SURGERY Left    2008   Family History:  Family History  Problem Relation Age of Onset   Diabetes Mother    Diabetes Father    Diabetes Maternal Grandmother    Family Psychiatric  History: None noted Social History:  Social History   Substance and Sexual Activity  Alcohol Use No   Comment: Caffeine intake is heavy     Social History   Substance and Sexual Activity  Drug Use No   Comment: Meth User/IV User    Social History   Socioeconomic History   Marital status: Single    Spouse name: Not on file   Number of children: 0  Years of education: hs   Highest education level: Not on file  Occupational History   Occupation: Unemployed    Employer: UNEMPLOYED  Tobacco Use   Smoking status: Former    Current packs/day: 0.50    Average packs/day: 0.5 packs/day for 10.0 years (5.0 ttl pk-yrs)    Types: Cigarettes   Smokeless tobacco: Never  Vaping Use   Vaping status: Never Used  Substance and Sexual Activity   Alcohol use: No    Comment: Caffeine intake is heavy   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  Other Topics Concern   Not on file  Social History Narrative   Not on file   Social Determinants of Health   Financial Resource Strain: Not on file  Food Insecurity: Not on file  Transportation Needs: Not on file  Physical  Activity: Not on file  Stress: Not on file  Social Connections: Not on file   Additional Social History: Lives in a group home    Allergies:   Allergies  Allergen Reactions   Amoxicillin Other (See Comments) and Swelling    Throat swelling   Lisinopril    Penicillin G Other (See Comments)   Penicillins Other (See Comments) and Swelling    Racing heart     Labs:  Results for orders placed or performed during the hospital encounter of 01/16/23 (from the past 48 hour(s))  Comprehensive metabolic panel     Status: Abnormal   Collection Time: 01/16/23  8:34 PM  Result Value Ref Range   Sodium 133 (L) 135 - 145 mmol/L   Potassium 4.0 3.5 - 5.1 mmol/L   Chloride 103 98 - 111 mmol/L   CO2 22 22 - 32 mmol/L   Glucose, Bld 119 (H) 70 - 99 mg/dL    Comment: Glucose reference range applies only to samples taken after fasting for at least 8 hours.   BUN 15 6 - 20 mg/dL   Creatinine, Ser 0.98 0.61 - 1.24 mg/dL   Calcium 8.9 8.9 - 11.9 mg/dL   Total Protein 7.3 6.5 - 8.1 g/dL   Albumin 4.4 3.5 - 5.0 g/dL   AST 38 15 - 41 U/L   ALT 23 0 - 44 U/L   Alkaline Phosphatase 71 38 - 126 U/L   Total Bilirubin 0.7 0.3 - 1.2 mg/dL   GFR, Estimated >14 >78 mL/min    Comment: (NOTE) Calculated using the CKD-EPI Creatinine Equation (2021)    Anion gap 8 5 - 15    Comment: Performed at [facility], [address] [place], [place] [address]  Ethanol     Status: None   Collection Time: 01/16/23  8:34 PM  Result Value Ref Range   Alcohol, Ethyl (B) <10 <10 mg/dL    Comment: (NOTE) Lowest detectable limit for serum alcohol is 10 mg/dL.  For medical purposes only. Performed at [facility], [address] [place], [place] [address]   Salicylate level     Status: Abnormal   Collection Time: 01/16/23  8:34 PM  Result Value Ref Range   Salicylate Lvl <7.0 (L) 7.0 - 30.0 mg/dL    Comment: Performed at [facility], [address],  [place], [place] [address]  Acetaminophen level     Status: Abnormal   Collection Time: 01/16/23  8:34 PM  Result Value Ref Range   Acetaminophen (Tylenol), Serum <10 (  L) 10 - 30 ug/mL    Comment: (NOTE) Therapeutic concentrations vary significantly. A range of 10-30 ug/mL  may be an effective concentration for many patients. However, some  are best treated at concentrations outside of this range. Acetaminophen concentrations >150 ug/mL at 4 hours after ingestion  and >50 ug/mL at 12 hours after ingestion are often associated with  toxic reactions.  Performed at [facility], [address], [place], [place] [address]   cbc     Status: None   Collection Time: 01/16/23  8:34 PM  Result Value Ref Range   WBC 9.1 4.0 - 10.5 K/uL   RBC 4.35 4.22 - 5.81 MIL/uL   Hemoglobin 13.1 13.0 - 17.0 g/dL   HCT 69.6 29.5 - 28.4 %   MCV 92.6 80.0 - 100.0 fL   MCH 30.1 26.0 - 34.0 pg   MCHC 32.5 30.0 - 36.0 g/dL   RDW 13.2 44.0 - 10.2 %   Platelets 296 150 - 400 K/uL   nRBC 0.0 0.0 - 0.2 %    Comment: Performed at [facility], [address] [place], [place] [address]  Lithium level     Status: None   Collection Time: 01/16/23  8:34 PM  Result Value Ref Range   Lithium Lvl 0.74 0.60 - 1.20 mmol/L    Comment: Performed at [facility], [address], [place], [place] [address]  CBG monitoring, ED     Status: Abnormal   Collection Time: 01/16/23 10:08 PM  Result Value Ref Range   Glucose-Capillary 117 (H) 70 - 99 mg/dL    Comment: Glucose reference range applies only to samples taken after fasting for at least 8 hours.  Rapid urine drug screen (hospital performed)     Status: None   Collection Time: 01/16/23 11:40 PM  Result Value Ref Range   Opiates NONE DETECTED NONE DETECTED   Cocaine NONE DETECTED NONE DETECTED   Benzodiazepines NONE DETECTED NONE DETECTED   Amphetamines NONE DETECTED NONE DETECTED   Tetrahydrocannabinol NONE  DETECTED NONE DETECTED   Barbiturates NONE DETECTED NONE DETECTED    Comment: (NOTE) DRUG SCREEN FOR MEDICAL PURPOSES ONLY.  IF CONFIRMATION IS NEEDED FOR ANY PURPOSE, NOTIFY LAB WITHIN 5 DAYS.  LOWEST DETECTABLE LIMITS FOR URINE DRUG SCREEN Drug Class                     Cutoff (ng/mL) Amphetamine and metabolites    1000 Barbiturate and metabolites    200 Benzodiazepine                 200 Opiates and metabolites        300 Cocaine and metabolites        300 THC                            50 Performed at [facility], [address], [place], [place] [address]     Current Facility-Administered Medications  Medication Dose Route Frequency Provider Last Rate Last Admin   FLUoxetine (PROZAC) capsule 20 mg  20 mg Oral Daily Rancour, Stephen, MD       QUEtiapine (SEROQUEL) tablet 200 mg  200 mg Oral TID Glynn Octave, MD   200 mg at 01/17/23 0004   traZODone (DESYREL) tablet 100 mg  100 mg Oral QHS PRN Glynn Octave, MD   100 mg at 01/17/23 0004   Current  Outpatient Medications  Medication Sig Dispense Refill   benztropine (COGENTIN) 1 MG tablet Take 1 tablet (1 mg total) by mouth 2 (two) times daily. 60 tablet 0   FLUoxetine (PROZAC) 20 MG capsule Take 1 capsule (20 mg total) by mouth daily. 30 capsule 0   lithium carbonate (ESKALITH) 450 MG CR tablet Take 1 tablet (450 mg total) by mouth 2 (two) times daily after a meal. 60 tablet 0   QUEtiapine (SEROQUEL) 200 MG tablet Take 200 mg by mouth 3 (three) times daily.     QUEtiapine (SEROQUEL) 50 MG tablet Take 50-100 mg by mouth daily as needed.     traZODone (DESYREL) 100 MG tablet Take 1 tablet (100 mg total) by mouth at bedtime as needed for sleep. (Patient taking differently: Take 150 mg by mouth at bedtime.) 30 tablet 0   traZODone (DESYREL) 150 MG tablet Take 150 mg by mouth at bedtime as needed.      Musculoskeletal: Strength & Muscle Tone: within normal limits Gait & Station: normal Patient leans:  N/A   Psychiatric Specialty Exam: Presentation  General Appearance:  Appropriate for Environment  Eye Contact: Good  Speech: Clear and Coherent  Speech Volume: Normal  Handedness: Right   Mood and Affect  Mood: Euthymic  Affect: Congruent   Thought Process  Thought Processes: Goal Directed  Descriptions of Associations:Intact  Orientation:Full (Time, Place and Person)  Thought Content:WDL  History of Schizophrenia/Schizoaffective disorder:Yes  Duration of Psychotic Symptoms:Less than six months  Hallucinations:Hallucinations: Auditory; Visual Description of Auditory Hallucinations: "I hear demons telling me to kill myself" Description of Visual Hallucinations: "I see the devil stabbing"  Ideas of Reference:None  Suicidal Thoughts:Suicidal Thoughts: Yes, Active SI Active Intent and/or Plan: With Intent; With Plan  Homicidal Thoughts:Homicidal Thoughts: Yes, Active HI Active Intent and/or Plan: With Intent; Without Plan   Sensorium  Memory: Immediate Fair; Recent Fair; Remote Fair  Judgment: Impaired  Insight: Lacking   Executive Functions  Concentration: Fair  Attention Span: Fair  Recall: [employer] of Knowledge: Poor  Language: Fair   Psychomotor Activity  Psychomotor Activity: Psychomotor Activity: Normal   Assets  Assets: Manufacturing systems engineer; Housing; Leisure Time    Sleep  Sleep: Sleep: Fair Number of Hours of Sleep: 8   Physical Exam: Physical Exam Vitals and nursing note reviewed.  Eyes:     Pupils: Pupils are equal, round, and reactive to light.  Pulmonary:     Effort: Pulmonary effort is normal.  Skin:    General: Skin is dry.  Neurological:     Mental Status: He is alert and oriented to person, place, and time.    Review of Systems  Skin:        Superficial cuts on bilateral arms.  All other systems reviewed and are negative.  Blood pressure 106/73, pulse 62, temperature 97.8 F (36.6 C),  temperature source Oral, resp. rate 16, height 5' 8.25" (1.734 m), weight 73.4 kg, SpO2 98%. Body mass index is 24.42 kg/m.  Medical Decision Making: Patient case discussed and reviewed with Dr Lucianne Muss.  Patient states he ingested 3 different drugs of abuse in an attempt to commit suicide which is patently false; his UDS is negative.  Per collateral, patient will make the claim that he is suicidal because he enjoys spending time in the hospital and away from the group home.  Patient's mood is euthymic and at times he appears cheerful.  The group home is a controlled environment where the patient will be  closely monitored.  He is psychiatrically cleared.   Problem 1: Suicidal Ideation -Return to Group home for close monitoring -Recommend grief therapy to process feelings related to mother's death.   Disposition:  Patient is psychiatrically cleared and resources are added to the AVS  Thomes Lolling, NP 01/17/2023 10:25 AM

## 2023-01-17 NOTE — ED Notes (Signed)
Mr Gillian Scarce can come get pt around 1p when he gets his car back

## 2023-01-17 NOTE — ED Provider Notes (Signed)
Emergency Medicine Observation Re-evaluation Note  Joshua Franco is a 32 y.o. male, seen on rounds today.  Pt initially presented to the ED for complaints of Suicidal Currently, the patient is resting.  Physical Exam  BP 106/73 (BP Location: Left Arm)   Pulse 62   Temp 97.8 F (36.6 C) (Oral)   Resp 16   Ht 5' 8.25" (1.734 m)   Wt 73.4 kg   SpO2 98%   BMI 24.42 kg/m  Physical Exam General: NAD  ED Course / MDM  EKG:   I have reviewed the labs performed to date as well as medications administered while in observation.  Recent changes in the last 24 hours include no acute events reported.  Plan  Current plan is for placement.    Wynetta Fines, MD 01/17/23 [phone]

## 2023-01-17 NOTE — ED Notes (Signed)
Pts belongings placed in TCU locker 31.

## 2024-04-24 ENCOUNTER — Encounter: Payer: Self-pay | Admitting: Emergency Medicine

## 2024-04-24 ENCOUNTER — Ambulatory Visit: Admission: EM | Admit: 2024-04-24 | Discharge: 2024-04-24 | Disposition: A | Discharge status: Home / Self Care

## 2024-04-24 DIAGNOSIS — K5900 Constipation, unspecified: Secondary | ICD-10-CM

## 2024-04-24 DIAGNOSIS — R300 Dysuria: Secondary | ICD-10-CM

## 2024-04-24 LAB — POCT URINE DIPSTICK
Bilirubin, UA: NEGATIVE
Blood, UA: NEGATIVE
Glucose, UA: NEGATIVE mg/dL
Ketones, POC UA: NEGATIVE mg/dL
Leukocytes, UA: NEGATIVE
Nitrite, UA: NEGATIVE
POC PROTEIN,UA: NEGATIVE
Spec Grav, UA: 1.015 (ref 1.010–1.025)
Urobilinogen, UA: 0.2 U/dL
pH, UA: 7 (ref 5.0–8.0)

## 2024-04-24 MED ORDER — POLYETHYLENE GLYCOL 3350 17 GM/SCOOP PO POWD: 17.0000 g | Freq: Every day | ORAL | 0 refills | Status: AC

## 2024-04-24 NOTE — Discharge Instructions (Addendum)
 Urine appears normal. I believe your symptoms are due to constipation. Miralax has been prescribed to be taken once daily. Due this daily for 1 week then three times a week until your bowel movement are a soft, peanut butter consistency. If you experience increased abdominal pain, vomiting, or fever we should report to the ED.

## 2024-04-24 NOTE — ED Triage Notes (Addendum)
 Pt presents with Royal, caretaker.  Pt presents c/o mid center abd pain x 2 days. Pt states,  I am having sharp pains ( points to mid center abd). I can't eat and it's stops me from sleeping.   Pt denies emesis and diarrhea but does c/o nausea.

## 2024-04-24 NOTE — ED Provider Notes (Signed)
 [facility]    CSN: [id number] Arrival date & time: 04/24/24  1157      History   Chief Complaint Chief Complaint  Patient presents with   Abdominal Pain    HPI Joshua Franco is a 33 y.o. male.   Pt presents today due to 2 days of abdominal pain and nausea. Pt denies vomiting or diarrhea. Pt states that his stools have been hard to pass and look like rabbit pellets. Pt is with his aide and he reports that patient has been eating a lot of Thanksgiving leftovers. Pt states that he has been urinating more frequently and it has been darker than normal.   The history is provided by the patient.  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   History of high blood pressure 09/30/2021   History of diabetes mellitus 09/30/2021   Self-inflicted injury 10/16/2019   Posttraumatic stress disorder 09/10/2013   Schizoaffective disorder (HCC) 09/10/2013   Bipolar 1 disorder, depressed (HCC) 09/09/2013   Bipolar depression (HCC) 09/08/2013   Suicidal ideation 09/07/2013   Bipolar 1 disorder, depressed, severe (HCC) 08/30/2013   Bipolar disorder with severe depression (HCC) 08/30/2013   Acute posttraumatic stress disorder 02/13/2013    Past Surgical History:  Procedure Laterality Date   HAND SURGERY Left    2008       Home Medications    Prior to Admission medications   Medication Sig Start Date End Date Taking? Authorizing Provider   ABILIFY  MAINTENA 400 MG PRSY prefilled syringe SMARTSIG:1 Each IM Every 4 Weeks 03/31/24  Yes [provider]  polyethylene glycol powder (GLYCOLAX/MIRALAX) 17 GM/SCOOP powder Take 17 g by mouth daily. Dissolve 1 capful (17g) in 4-8 ounces of liquid and take by mouth daily. 04/24/24  Yes Andra Krabbe C, PA-C  benztropine  (COGENTIN ) 1 MG tablet Take 1 tablet (1 mg total) by mouth 2 (two) times daily. 09/19/13   Nicholaus Leita LABOR, NP  FLUoxetine  (PROZAC ) 20 MG capsule Take 1 capsule (20 mg total) by mouth daily. 09/19/13   Nicholaus Leita LABOR, NP  lithium  carbonate (ESKALITH ) 450 MG CR tablet Take 1 tablet (450 mg total) by mouth 2 (two) times daily after a meal. 09/19/13   Nicholaus Leita LABOR, NP  QUEtiapine  (SEROQUEL ) 200 MG tablet Take 200 mg by mouth 3 (three) times daily. 12/19/22   [provider]  QUEtiapine  (SEROQUEL ) 50 MG tablet Take 50-100 mg by mouth daily as needed. 07/15/21   [provider]  traZODone  (DESYREL ) 100 MG tablet Take 1 tablet (100 mg total) by mouth at bedtime as needed for sleep. Patient taking differently: Take 150 mg by mouth at bedtime. 09/19/13   Nicholaus Leita LABOR, NP  traZODone  (DESYREL ) 150 MG tablet Take 150 mg by mouth at bedtime as needed. 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     Physical Exam Triage Vital Signs ED Triage Vitals  Encounter Vitals Group     BP 04/24/24 1230 118/80     Girls Systolic BP Percentile --      Girls Diastolic BP Percentile --      Boys Systolic BP Percentile --      Boys Diastolic BP Percentile --      Pulse Rate 04/24/24 1230 82     Resp 04/24/24 1230 16     Temp 04/24/24 1230 97.6 F (36.4 C)     Temp Source 04/24/24 1230 Oral     SpO2 04/24/24 1230 97 %     Weight 04/24/24 1228 182 lb 1.6 oz (82.6 kg)     Height --      Head Circumference --      Peak Flow --      Pain Score 04/24/24 1228 10     Pain Loc --      Pain Education --      Exclude from Growth Chart --    No data found.  Updated Vital Signs BP 118/80 (BP Location: Left Arm)   Pulse 82   Temp 97.6 F (36.4 C) (Oral)   Resp 16   Wt 182 lb 1.6 oz (82.6 kg)   SpO2 97%   BMI 27.49 kg/m   Visual Acuity Right Eye Distance:   Left Eye Distance:   Bilateral Distance:    Right Eye Near:   Left Eye Near:    Bilateral Near:     Physical Exam Vitals and nursing note reviewed.  Constitutional:      General: He is not in acute distress.    Appearance: Normal appearance. He is not ill-appearing, toxic-appearing or diaphoretic.  Eyes:     General: No scleral icterus. Cardiovascular:     Rate and Rhythm: Normal rate and regular rhythm.     Heart sounds: Normal heart sounds.  Pulmonary:     Effort: Pulmonary effort is normal. No respiratory distress.     Breath sounds: Normal breath sounds. No wheezing or rhonchi.  Abdominal:     General: Abdomen is flat. Bowel sounds are normal.     Palpations: Abdomen is soft.     Tenderness: There is abdominal tenderness in the right upper quadrant, right lower quadrant, suprapubic area and left upper quadrant. There is no right CVA tenderness or left CVA tenderness.  Skin:    General: Skin is warm.  Neurological:     Mental Status: He is alert and oriented to person, place, and  time.  Psychiatric:        Mood and Affect: Mood normal.  Behavior: Behavior normal.      UC Treatments / Results  Labs (all labs ordered are listed, but only abnormal results are displayed) Labs Reviewed  POCT URINE DIPSTICK    EKG   Radiology No results found.  Procedures Procedures (including critical care time)  Medications Ordered in UC Medications - No data to display  Initial Impression / Assessment and Plan / UC Course  I have reviewed the triage vital signs and the nursing notes.  Pertinent labs & imaging results that were available during my care of the patient were reviewed by me and considered in my medical decision making (see chart for details).    Final Clinical Impressions(s) / UC Diagnoses   Final diagnoses:  Dysuria  Constipation, unspecified constipation type     Discharge Instructions      Urine appears normal. I believe your symptoms are due to constipation. Miralax has been prescribed to be taken once daily. Due this daily for 1 week then three times a week until your bowel movement are a soft, peanut butter consistency. If you experience increased abdominal pain, vomiting, or fever we should report to the ED.    ED Prescriptions     Medication Sig Dispense Auth. Provider   polyethylene glycol powder (GLYCOLAX/MIRALAX) 17 GM/SCOOP powder Take 17 g by mouth daily. Dissolve 1 capful (17g) in 4-8 ounces of liquid and take by mouth daily. 238 g Andra Corean BROCKS, PA-C      PDMP not reviewed this encounter.   Andra Corean BROCKS, PA-C 04/24/24 1434
